# Patient Record
Sex: Female | Born: 1935 | Race: White | Hispanic: No | Marital: Single | Smoking: Never smoker
Health system: Southern US, Community
[De-identification: ages and names within clinical notes are randomized; demographics above are authoritative.]

## PROBLEM LIST (undated history)

## (undated) DIAGNOSIS — I499 Cardiac arrhythmia, unspecified: Secondary | ICD-10-CM

## (undated) DIAGNOSIS — N189 Chronic kidney disease, unspecified: Secondary | ICD-10-CM

## (undated) DIAGNOSIS — K219 Gastro-esophageal reflux disease without esophagitis: Secondary | ICD-10-CM

## (undated) DIAGNOSIS — I10 Essential (primary) hypertension: Secondary | ICD-10-CM

## (undated) DIAGNOSIS — H409 Unspecified glaucoma: Secondary | ICD-10-CM

## (undated) DIAGNOSIS — E7800 Pure hypercholesterolemia, unspecified: Secondary | ICD-10-CM

## (undated) HISTORY — PX: LAPAROSCOPIC OOPHORECTOMY: SUR783

## (undated) HISTORY — PX: CHOLECYSTECTOMY: SHX55

## (undated) HISTORY — PX: DILATION AND CURETTAGE OF UTERUS: SHX78

## (undated) HISTORY — PX: COLONOSCOPY: SHX174

## (undated) HISTORY — PX: ABDOMINAL HYSTERECTOMY: SHX81

## (undated) HISTORY — DX: Cardiac arrhythmia, unspecified: I49.9

## (undated) HISTORY — PX: BREAST SURGERY: SHX581

## (undated) HISTORY — PX: EYE SURGERY: SHX253

---

## 2014-05-22 DIAGNOSIS — N3941 Urge incontinence: Secondary | ICD-10-CM | POA: Insufficient documentation

## 2015-04-03 ENCOUNTER — Other Ambulatory Visit: Payer: Self-pay | Admitting: Surgery

## 2015-04-03 DIAGNOSIS — N6452 Nipple discharge: Secondary | ICD-10-CM

## 2015-04-18 ENCOUNTER — Other Ambulatory Visit: Payer: Self-pay | Admitting: *Deleted

## 2015-04-18 ENCOUNTER — Inpatient Hospital Stay
Admission: RE | Admit: 2015-04-18 | Discharge: 2015-04-18 | Disposition: A | Discharge status: Home / Self Care | Payer: Self-pay | Source: Ambulatory Visit | Attending: *Deleted | Admitting: *Deleted

## 2015-04-18 DIAGNOSIS — Z9289 Personal history of other medical treatment: Secondary | ICD-10-CM

## 2015-04-26 ENCOUNTER — Other Ambulatory Visit: Payer: Self-pay | Admitting: Surgery

## 2015-04-26 ENCOUNTER — Ambulatory Visit
Admission: RE | Admit: 2015-04-26 | Discharge: 2015-04-26 | Disposition: A | Discharge status: Home / Self Care | Payer: Medicare Other | Source: Ambulatory Visit | Attending: Surgery | Admitting: Surgery

## 2015-04-26 DIAGNOSIS — N6452 Nipple discharge: Secondary | ICD-10-CM

## 2015-05-01 ENCOUNTER — Other Ambulatory Visit: Payer: Self-pay | Admitting: Surgery

## 2015-05-01 DIAGNOSIS — N6310 Unspecified lump in the right breast, unspecified quadrant: Secondary | ICD-10-CM

## 2015-05-09 ENCOUNTER — Ambulatory Visit
Admission: RE | Admit: 2015-05-09 | Discharge: 2015-05-09 | Disposition: A | Discharge status: Home / Self Care | Payer: Medicare Other | Source: Ambulatory Visit | Attending: Surgery | Admitting: Surgery

## 2015-05-09 DIAGNOSIS — D241 Benign neoplasm of right breast: Secondary | ICD-10-CM | POA: Diagnosis not present

## 2015-05-09 DIAGNOSIS — R928 Other abnormal and inconclusive findings on diagnostic imaging of breast: Secondary | ICD-10-CM | POA: Diagnosis present

## 2015-05-09 DIAGNOSIS — N6452 Nipple discharge: Secondary | ICD-10-CM | POA: Diagnosis present

## 2015-05-09 DIAGNOSIS — N6310 Unspecified lump in the right breast, unspecified quadrant: Secondary | ICD-10-CM

## 2015-05-10 LAB — SURGICAL PATHOLOGY

## 2015-05-30 ENCOUNTER — Other Ambulatory Visit: Payer: Self-pay | Admitting: Surgery

## 2015-05-30 DIAGNOSIS — N63 Unspecified lump in unspecified breast: Secondary | ICD-10-CM

## 2015-06-04 ENCOUNTER — Encounter
Admission: RE | Admit: 2015-06-04 | Discharge: 2015-06-04 | Disposition: A | Discharge status: Home / Self Care | Payer: Medicare Other | Source: Ambulatory Visit | Attending: Surgery | Admitting: Surgery

## 2015-06-04 ENCOUNTER — Encounter: Payer: Self-pay | Admitting: *Deleted

## 2015-06-04 DIAGNOSIS — Z01810 Encounter for preprocedural cardiovascular examination: Secondary | ICD-10-CM | POA: Diagnosis not present

## 2015-06-04 HISTORY — DX: Essential (primary) hypertension: I10

## 2015-06-04 HISTORY — DX: Chronic kidney disease, unspecified: N18.9

## 2015-06-04 HISTORY — DX: Gastro-esophageal reflux disease without esophagitis: K21.9

## 2015-06-04 HISTORY — DX: Pure hypercholesterolemia, unspecified: E78.00

## 2015-06-04 NOTE — Pre-Procedure Instructions (Signed)
Medical clearance request called and faxed to Dr. Rochel Brome office, spoke to [place].

## 2015-06-04 NOTE — Pre-Procedure Instructions (Signed)
Dr. Rosey Bath notified of abnormal EKG, and medical clearance requested for surgery.

## 2015-06-04 NOTE — Patient Instructions (Signed)
  Your procedure is scheduled [id number] 21, 2017 (Tuesday) Report to Mammography.([place]) To find out your arrival time please call [phone] between 1PM - 3PM on ARRIVAL TIME 10:30 AM.  Remember: Instructions that are not followed completely may result in serious medical risk, up to and including death, or upon the discretion of your surgeon and anesthesiologist your surgery may need to be rescheduled.    __x__ 1. Do not eat food or drink liquids after midnight. No gum chewing or hard candies.     __x__ 2. No Alcohol for 24 hours before or after surgery.   ____ 3. Bring all medications with you on the day of surgery if instructed.    __x__ 4. Notify your doctor if there is any change in your medical condition     (cold, fever, infections).     Do not wear jewelry, make-up, hairpins, clips or nail polish.  Do not wear lotions, powders, or perfumes. You may wear deodorant.  Do not shave 48 hours prior to surgery. Men may shave face and neck.  Do not bring valuables to the hospital.    [facility] is not responsible for any belongings or valuables.               Contacts, dentures or bridgework may not be worn into surgery.  Leave your suitcase in the car. After surgery it may be brought to your room.  For patients admitted to the hospital, discharge time is determined by your                treatment team.   Patients discharged the day of surgery will not be allowed to drive home.   Please read over the following fact sheets that you were given:   Surgical Site Infection Prevention   __x__ Take these medicines the morning of surgery with A SIP OF WATER:    1. Losartan  2. Omeprazole (Omeprazole at bedtime on Monday night)  3.   4.  5.  6.  ____ Fleet Enema (as directed)   ____ Use CHG Soap as directed  ____ Use inhalers on the day of surgery  ____ Stop metformin 2 days prior to surgery    ____ Take 1/2 of usual insulin dose the night before surgery  and none on the morning of surgery.   _x___ Stop Coumadin/Plavix/aspirin on (Stop Aspirin today)  _x___ Stop Anti-inflammatories on (NO NSAIDS) Tylenol ok to take for pain if needed   __x__ Stop supplements until after surgery.  (Stop Probiotic, Centrum Women, I-CAPS, and Black cohosh) NOW  ____ Bring C-Pap to the hospital.

## 2015-06-10 NOTE — Pre-Procedure Instructions (Signed)
SPOKE WITH Ceola Para AT DR Nicholes Stairs OFFICE. MEDICAL MD HAS NOT BEEN ABLE TO CONTACT PATIENT TO CLEAR. WILL TRY TO GET WORKED IN TODAY

## 2015-06-11 ENCOUNTER — Encounter: Payer: Self-pay | Admitting: *Deleted

## 2015-06-11 ENCOUNTER — Ambulatory Visit
Admission: RE | Admit: 2015-06-11 | Discharge: 2015-06-11 | Disposition: A | Discharge status: Home / Self Care | Payer: Medicare Other | Source: Ambulatory Visit | Attending: Surgery | Admitting: Surgery

## 2015-06-11 ENCOUNTER — Ambulatory Visit: Payer: Medicare Other | Admitting: Anesthesiology

## 2015-06-11 ENCOUNTER — Encounter
Admission: RE | Disposition: A | Discharge status: Home / Self Care | Payer: Self-pay | Source: Ambulatory Visit | Attending: Surgery

## 2015-06-11 DIAGNOSIS — Z882 Allergy status to sulfonamides status: Secondary | ICD-10-CM | POA: Diagnosis not present

## 2015-06-11 DIAGNOSIS — E785 Hyperlipidemia, unspecified: Secondary | ICD-10-CM | POA: Insufficient documentation

## 2015-06-11 DIAGNOSIS — Z7982 Long term (current) use of aspirin: Secondary | ICD-10-CM | POA: Diagnosis not present

## 2015-06-11 DIAGNOSIS — I10 Essential (primary) hypertension: Secondary | ICD-10-CM | POA: Diagnosis not present

## 2015-06-11 DIAGNOSIS — Z79899 Other long term (current) drug therapy: Secondary | ICD-10-CM | POA: Insufficient documentation

## 2015-06-11 DIAGNOSIS — Z79890 Hormone replacement therapy (postmenopausal): Secondary | ICD-10-CM | POA: Diagnosis not present

## 2015-06-11 DIAGNOSIS — Z823 Family history of stroke: Secondary | ICD-10-CM | POA: Diagnosis not present

## 2015-06-11 DIAGNOSIS — N63 Unspecified lump in unspecified breast: Secondary | ICD-10-CM

## 2015-06-11 DIAGNOSIS — H409 Unspecified glaucoma: Secondary | ICD-10-CM | POA: Diagnosis not present

## 2015-06-11 DIAGNOSIS — Z90710 Acquired absence of both cervix and uterus: Secondary | ICD-10-CM | POA: Insufficient documentation

## 2015-06-11 DIAGNOSIS — D241 Benign neoplasm of right breast: Secondary | ICD-10-CM | POA: Diagnosis not present

## 2015-06-11 DIAGNOSIS — Z820 Family history of epilepsy and other diseases of the nervous system: Secondary | ICD-10-CM | POA: Diagnosis not present

## 2015-06-11 DIAGNOSIS — Z9049 Acquired absence of other specified parts of digestive tract: Secondary | ICD-10-CM | POA: Insufficient documentation

## 2015-06-11 DIAGNOSIS — N62 Hypertrophy of breast: Secondary | ICD-10-CM | POA: Diagnosis not present

## 2015-06-11 DIAGNOSIS — Z90722 Acquired absence of ovaries, bilateral: Secondary | ICD-10-CM | POA: Diagnosis not present

## 2015-06-11 DIAGNOSIS — Z803 Family history of malignant neoplasm of breast: Secondary | ICD-10-CM | POA: Insufficient documentation

## 2015-06-11 HISTORY — PX: BREAST BIOPSY: SHX20

## 2015-06-11 HISTORY — DX: Unspecified glaucoma: H40.9

## 2015-06-11 HISTORY — PX: BREAST EXCISIONAL BIOPSY: SUR124

## 2015-06-11 SURGERY — BREAST BIOPSY WITH NEEDLE LOCALIZATION
Anesthesia: General | Site: Breast | Laterality: Right | Wound class: Clean

## 2015-06-11 MED ORDER — HYDROCODONE-ACETAMINOPHEN 5-325 MG PO TABS: 1.0000 | ORAL_TABLET | ORAL | Status: DC | PRN

## 2015-06-11 MED ORDER — LACTATED RINGERS IV SOLN
INTRAVENOUS | Status: DC
Start: 2015-06-11 — End: 2015-06-11
  Administered 2015-06-11: 12:00:00 via INTRAVENOUS

## 2015-06-11 MED ORDER — PROPOFOL 10 MG/ML IV BOLUS
INTRAVENOUS | Status: DC | PRN
  Administered 2015-06-11: 100 mg via INTRAVENOUS

## 2015-06-11 MED ORDER — ACETAMINOPHEN 10 MG/ML IV SOLN
INTRAVENOUS | Status: DC | PRN
  Administered 2015-06-11: 1000 mg via INTRAVENOUS

## 2015-06-11 MED ORDER — FENTANYL CITRATE (PF) 100 MCG/2ML IJ SOLN
25.0000 ug | INTRAMUSCULAR | Status: DC | PRN
Start: 2015-06-11 — End: 2015-06-11

## 2015-06-11 MED ORDER — ACETAMINOPHEN 10 MG/ML IV SOLN
INTRAVENOUS | Status: AC
  Filled 2015-06-11: qty 100

## 2015-06-11 MED ORDER — ONDANSETRON HCL 4 MG/2ML IJ SOLN: 4.0000 mg | Freq: Once | INTRAMUSCULAR | Status: DC | PRN

## 2015-06-11 MED ORDER — LIDOCAINE HCL (CARDIAC) 20 MG/ML IV SOLN
INTRAVENOUS | Status: DC | PRN
  Administered 2015-06-11: 60 mg via INTRAVENOUS

## 2015-06-11 MED ORDER — ONDANSETRON HCL 4 MG/2ML IJ SOLN
INTRAMUSCULAR | Status: DC | PRN
  Administered 2015-06-11: 4 mg via INTRAVENOUS

## 2015-06-11 MED ORDER — FENTANYL CITRATE (PF) 100 MCG/2ML IJ SOLN
INTRAMUSCULAR | Status: DC | PRN
  Administered 2015-06-11 (×4): 25 ug via INTRAVENOUS

## 2015-06-11 MED ORDER — EPHEDRINE SULFATE 50 MG/ML IJ SOLN
INTRAMUSCULAR | Status: DC | PRN
  Administered 2015-06-11 (×2): 10 mg via INTRAVENOUS

## 2015-06-11 MED ORDER — BUPIVACAINE-EPINEPHRINE (PF) 0.5% -1:200000 IJ SOLN
INTRAMUSCULAR | Status: AC
  Filled 2015-06-11: qty 30

## 2015-06-11 MED ORDER — BUPIVACAINE-EPINEPHRINE (PF) 0.5% -1:200000 IJ SOLN
INTRAMUSCULAR | Status: DC | PRN
  Administered 2015-06-11: 7 mL via PERINEURAL

## 2015-06-11 SURGICAL SUPPLY — 23 items
CANISTER SUCT 1200ML W/VALVE (MISCELLANEOUS) ×3 IMPLANT
CHLORAPREP W/TINT 26ML (MISCELLANEOUS) ×3 IMPLANT
CNTNR SPEC 2.5X3XGRAD LEK (MISCELLANEOUS)
CONT SPEC 4OZ STER OR WHT (MISCELLANEOUS)
CONTAINER SPEC 2.5X3XGRAD LEK (MISCELLANEOUS) IMPLANT
DRAPE LAPAROTOMY 77X122 PED (DRAPES) ×3 IMPLANT
ELECT REM PT RETURN 9FT ADLT (ELECTROSURGICAL) ×3
ELECTRODE REM PT RTRN 9FT ADLT (ELECTROSURGICAL) ×1 IMPLANT
GAUZE SPONGE 4X4 12PLY STRL (GAUZE/BANDAGES/DRESSINGS) ×3 IMPLANT
GLOVE BIO SURGEON STRL SZ7.5 (GLOVE) ×9 IMPLANT
GLOVE BIOGEL PI IND STRL 7.5 (GLOVE) ×1 IMPLANT
GLOVE BIOGEL PI INDICATOR 7.5 (GLOVE) ×2
GLOVE EXAM NITRILE PF MED BLUE (GLOVE) ×3 IMPLANT
GOWN STRL REUS W/ TWL LRG LVL3 (GOWN DISPOSABLE) ×3 IMPLANT
GOWN STRL REUS W/TWL LRG LVL3 (GOWN DISPOSABLE) ×6
KIT RM TURNOVER STRD PROC AR (KITS) ×3 IMPLANT
LABEL OR SOLS (LABEL) ×3 IMPLANT
LIQUID BAND (GAUZE/BANDAGES/DRESSINGS) ×3 IMPLANT
NEEDLE HYPO 25X1 1.5 SAFETY (NEEDLE) ×6 IMPLANT
PACK BASIN MINOR ARMC (MISCELLANEOUS) ×3 IMPLANT
SUT CHROMIC 5 0 RB 1 27 (SUTURE) ×3 IMPLANT
SYRINGE 10CC LL (SYRINGE) ×3 IMPLANT
WATER STERILE IRR 1000ML POUR (IV SOLUTION) ×3 IMPLANT

## 2015-06-11 NOTE — Anesthesia Preprocedure Evaluation (Addendum)
Anesthesia Evaluation  Patient identified by MRN, date of birth, ID band Patient awake    Reviewed: Allergy & Precautions, H&P , NPO status , Patient's Chart, lab work & pertinent test results, reviewed documented beta blocker date and time   Airway Mallampati: II  TM Distance: >3 FB Neck ROM: full    Dental  (+) Teeth Intact   Pulmonary neg pulmonary ROS,    Pulmonary exam normal        Cardiovascular Exercise Tolerance: Good hypertension, negative cardio ROS Normal cardiovascular exam Rate:Normal     Neuro/Psych negative neurological ROS  negative psych ROS   GI/Hepatic negative GI ROS, Neg liver ROS, GERD  ,  Endo/Other  negative endocrine ROS  Renal/GU Renal diseasenegative Renal ROS  negative genitourinary   Musculoskeletal   Abdominal   Peds  Hematology negative hematology ROS (+)   Anesthesia Other Findings   Reproductive/Obstetrics negative OB ROS                            Anesthesia Physical Anesthesia Plan  ASA: II  Anesthesia Plan: General LMA   Post-op Pain Management:    Induction:   Airway Management Planned: LMA  Additional Equipment:   Intra-op Plan:   Post-operative Plan:   Informed Consent: I have reviewed the patients History and Physical, chart, labs and discussed the procedure including the risks, benefits and alternatives for the proposed anesthesia with the patient or authorized representative who has indicated his/her understanding and acceptance.     Plan Discussed with: CRNA  Anesthesia Plan Comments:        Anesthesia Quick Evaluation

## 2015-06-11 NOTE — Transfer of Care (Signed)
Immediate Anesthesia Transfer of Care Note  Patient: Felicia Hughes  Procedure(s) Performed: Procedure(s): BREAST BIOPSY WITH NEEDLE LOCALIZATION (Right)  Patient Location: PACU  Anesthesia Type:General  Level of Consciousness: sedated and responds to stimulation  Airway & Oxygen Therapy: Patient Spontanous Breathing and Patient connected to face mask oxygen  Post-op Assessment: Report given to RN and Post -op Vital signs reviewed and stable  Post vital signs: Reviewed and stable  Last Vitals:  Filed Vitals:   06/11/15 1153 06/11/15 1550  BP: 141/57 115/49  Pulse: 69 86  Temp: 36.6 C 36.6 C  Resp: 14 17    Complications: No apparent anesthesia complications

## 2015-06-11 NOTE — Discharge Instructions (Signed)
Take Tylenol or Norco if needed for pain.  May resume aspirin on Thursday.  May wear bra as desired for support.May shower    .AMBULATORY SURGERY  DISCHARGE INSTRUCTIONS   1) The drugs that you were given will stay in your system until tomorrow so for the next 24 hours you should not:  A) Drive an automobile B) Make any legal decisions C) Drink any alcoholic beverage   2) You may resume regular meals tomorrow.  Today it is better to start with liquids and gradually work up to solid foods.  You may eat anything you prefer, but it is better to start with liquids, then soup and crackers, and gradually work up to solid foods.   3) Please notify your doctor immediately if you have any unusual bleeding, trouble breathing, redness and pain at the surgery site, drainage, fever, or pain not relieved by medication. 4)   5) Your post-operative visit with Dr.                                     is: Date:                        Time:    Please call to schedule your post-operative visit.  6) Additional Instructions:

## 2015-06-11 NOTE — Pre-Procedure Instructions (Signed)
Interpreted EKG by Dr. Rockey Situ showed poor R wave progression not anterior MI, spoke with Dr. Ronelle Nigh 06/10/15, 17:00, in light of this interpretation no additional clearance needed and it would be okay to proceed to surgery.  Dr. Thompson Caul office had not been able to reach the patient and she did not call Same Day for arrival time.  After speaking with Dr. Tamala Julian this morning it is decided that if she arrives to needle localization this am as had been scheduled we will proceed with surgery.

## 2015-06-11 NOTE — Anesthesia Postprocedure Evaluation (Signed)
Anesthesia Post Note  Patient: Felicia Hughes  Procedure(s) Performed: Procedure(s) (LRB): BREAST BIOPSY WITH NEEDLE LOCALIZATION (Right)  Patient location during evaluation: PACU Anesthesia Type: General Level of consciousness: awake and alert Pain management: pain level controlled Vital Signs Assessment: post-procedure vital signs reviewed and stable Respiratory status: spontaneous breathing, nonlabored ventilation, respiratory function stable and patient connected to nasal cannula oxygen Cardiovascular status: blood pressure returned to baseline and stable Postop Assessment: no signs of nausea or vomiting Anesthetic complications: no    Last Vitals:  Filed Vitals:   06/11/15 1631 06/11/15 1708  BP: 126/45 117/44  Pulse: 85 85  Temp:    Resp: 16 16    Last Pain:  Filed Vitals:   06/11/15 1709  PainSc: 2                  Martha Clan

## 2015-06-11 NOTE — Progress Notes (Signed)
Dr. Tamala Julian in to see pt and advises ok to d/c to home

## 2015-06-11 NOTE — Anesthesia Procedure Notes (Signed)
Procedure Name: LMA Insertion Date/Time: 06/11/2015 2:24 PM Performed by: Delaney Meigs Pre-anesthesia Checklist: Patient identified, Emergency Drugs available, Suction available, Patient being monitored and Timeout performed Patient Re-evaluated:Patient Re-evaluated prior to inductionOxygen Delivery Method: Circle system utilized and Simple face mask Preoxygenation: Pre-oxygenation with 100% oxygen Intubation Type: IV induction Ventilation: Mask ventilation without difficulty LMA Size: 3.5 Tube type: Oral Number of attempts: 1 Placement Confirmation: breath sounds checked- equal and bilateral and positive ETCO2 Tube secured with: Tape

## 2015-06-11 NOTE — H&P (Signed)
  She reports no change in condition since the day of the office visit. She has had the Kopan's wire inserted. I have reviewed her mammogram images. Recent biopsy demonstrated intraductal papilloma. Plan is to remove the right breast mass as discussed. Right side was marked YES

## 2015-06-11 NOTE — Op Note (Signed)
OPERATIVE REPORT  PREOPERATIVE  DIAGNOSIS: . Intraductal papilloma right breast  POSTOPERATIVE DIAGNOSIS: . Intraductal papilloma right breast  PROCEDURE: . Excision of right breast mass  ANESTHESIA:  General  SURGEON: Rochel Brome  MD   INDICATIONS: . She has a history of right nipple discharge. She had ultrasound-guided biopsy of a mass in the retroareolar portion of the right breast associated with a dilated duct. Biopsy demonstrated intraductal papilloma. She had preoperative x-ray needle localization. Surgery was recommended for definitive treatment and further evaluation.  With the patient on the operating table in the supine position under general anesthesia the dressing was removed from the right breast exposing the Kopan's wire which entered the upper aspect of the right breast. The wire was cut 2 cm from the skin. Mammograms reviewed. The site was prepared with ChloraPrep and draped in a sterile manner  An incision was made at the border of the areola from 10:00 to 2:00 position carried down through subcutaneous tissues to encounter the thick portion of the wire. A sample of tissue surrounding the wire was excised using electrocautery for hemostasis. The specimen was submitted for specimen mammogram and routine pathology. The radiologist did call to report that the biopsy marker was seen within the specimen mammogram.  The wound was inspected and a number of small bleeding points were cauterized until hemostasis was intact. Subcutaneous tissues were infiltrated with half percent Sensorcaine with epinephrine. Subcutaneous tissues tissues were approximated with 4-0 Monocryl. The skin was closed with running 4-0 Monocryl subcuticular suture and LiquiBand. The patient appeared to be in satisfactory condition and was prepared for transfer to the recovery room  [email or web address].

## 2015-06-12 ENCOUNTER — Encounter: Payer: Self-pay | Admitting: Surgery

## 2015-06-12 LAB — SURGICAL PATHOLOGY

## 2015-06-18 DIAGNOSIS — N390 Urinary tract infection, site not specified: Secondary | ICD-10-CM | POA: Insufficient documentation

## 2016-01-03 ENCOUNTER — Other Ambulatory Visit: Payer: Self-pay | Admitting: *Deleted

## 2016-01-03 ENCOUNTER — Inpatient Hospital Stay
Admission: RE | Admit: 2016-01-03 | Discharge: 2016-01-03 | Disposition: A | Discharge status: Home / Self Care | Payer: Self-pay | Source: Ambulatory Visit | Attending: *Deleted | Admitting: *Deleted

## 2016-01-03 DIAGNOSIS — Z9289 Personal history of other medical treatment: Secondary | ICD-10-CM

## 2016-02-26 ENCOUNTER — Other Ambulatory Visit: Payer: Self-pay | Admitting: Internal Medicine

## 2016-02-26 DIAGNOSIS — Z1231 Encounter for screening mammogram for malignant neoplasm of breast: Secondary | ICD-10-CM

## 2016-03-31 DIAGNOSIS — N3281 Overactive bladder: Secondary | ICD-10-CM | POA: Insufficient documentation

## 2016-04-29 ENCOUNTER — Ambulatory Visit
Admission: RE | Admit: 2016-04-29 | Discharge: 2016-04-29 | Disposition: A | Discharge status: Home / Self Care | Payer: Medicare Other | Source: Ambulatory Visit | Attending: Internal Medicine | Admitting: Internal Medicine

## 2016-04-29 DIAGNOSIS — Z1231 Encounter for screening mammogram for malignant neoplasm of breast: Secondary | ICD-10-CM | POA: Diagnosis present

## 2016-05-14 ENCOUNTER — Other Ambulatory Visit: Payer: Self-pay | Admitting: Internal Medicine

## 2016-05-14 DIAGNOSIS — R634 Abnormal weight loss: Secondary | ICD-10-CM

## 2016-05-14 DIAGNOSIS — D649 Anemia, unspecified: Secondary | ICD-10-CM

## 2016-05-25 ENCOUNTER — Ambulatory Visit
Admission: RE | Admit: 2016-05-25 | Discharge: 2016-05-25 | Disposition: A | Discharge status: Home / Self Care | Payer: Medicare Other | Source: Ambulatory Visit | Attending: Internal Medicine | Admitting: Internal Medicine

## 2016-05-25 DIAGNOSIS — R634 Abnormal weight loss: Secondary | ICD-10-CM

## 2016-05-25 DIAGNOSIS — D649 Anemia, unspecified: Secondary | ICD-10-CM | POA: Diagnosis present

## 2016-05-25 DIAGNOSIS — K4090 Unilateral inguinal hernia, without obstruction or gangrene, not specified as recurrent: Secondary | ICD-10-CM | POA: Insufficient documentation

## 2016-05-25 DIAGNOSIS — N2889 Other specified disorders of kidney and ureter: Secondary | ICD-10-CM | POA: Insufficient documentation

## 2016-05-25 MED ORDER — IOPAMIDOL (ISOVUE-300) INJECTION 61%
85.0000 mL | Freq: Once | INTRAVENOUS | Status: AC | PRN
  Administered 2016-05-25: 85 mL via INTRAVENOUS

## 2016-12-11 NOTE — Progress Notes (Signed)
12/14/2016 11:08 AM   Felicia Hughes Aug 07, 1933 [id number]  Referring provider: Tracie Harrier, MD [address] [facility] [place],  [address]  Chief Complaint  Patient presents with  . New Patient (Initial Visit)    recurrent UTI    HPI: Patient is a 83 -year-old Caucasian female who is referred to [place] by, Dr. Ginette Pitman, for recurrent urinary tract infections.  Patient states that she has had several urinary tract infections over the last year.  Reviewing her records,  she has had 6 documented positive urine cultures.  She is a former patient of Dr. Bernardo Heater and was last seen by him in 03/2016.  She was managed on methenamine and oxybutynin.    Her symptoms with a urinary tract infection consist of frequency, urgency and suprapubic pain.  She is currently on Ceftin at this time.  Last urine culture performed on 12/10/2016 was negative.    Today, she is experiencing frequency x 1 1/2 hour, urgency is strong, nocturia x 4, incontinence (wearing one thin pad daily, SUI and UI), intermittency, hesitancy and straining to urinate.  She endorses suprapubic pain, but she denies dysuria, gross hematuria, back pain, abdominal pain and flank pain.  She has not had any recent fevers, chills, nausea or vomiting.  Her UA was negative.  This was a CATH specimen.  Her PVR was 35 mL.    She does not have a history of nephrolithiasis, GU surgery or GU trauma.  She has not been sexually active for over one year.    She is postmenopausal.   She admits to constipation.  She has a rectocele.   She is taking a stool softener daily.  She is having daily BM's.  She does engage in good perineal hygiene. She does not take tub baths.   She is having pain with bladder filling.    Contrast CT performed on 05/25/2016 for unintentional weight loss noted bilateral renal scarring.  She is drinking 3 to 4 cups of water daily.   1 diet Coca-Cola daily.  1 cup of coffee daily.  1 glass of  wine daily.    Reviewed referral notes - patient is on Hiprex (quit taking the medication - didn't feel it was helping), vaginal estrogen cream (not consistently), oxybutynin (still tanking) and tropsium (not taking)    PMH: Past Medical History:  Diagnosis Date  . Arrhythmia   . Chronic kidney disease    UTI  . GERD (gastroesophageal reflux disease)   . Glaucoma (increased eye pressure)   . Hypercholesterolemia   . Hypertension     Surgical History: Past Surgical History:  Procedure Laterality Date  . ABDOMINAL HYSTERECTOMY    . BREAST BIOPSY Right 06/11/2015   Procedure: BREAST BIOPSY WITH NEEDLE LOCALIZATION;  Surgeon: Leonie Green, MD;  Location: [facility] ORS;  Service: General;  Laterality: Right;  . BREAST EXCISIONAL BIOPSY Right 06/11/2015   NEG  . BREAST SURGERY Right    Breast Needle Biopsy  . CHOLECYSTECTOMY    . DILATION AND CURETTAGE OF UTERUS    . EYE SURGERY Bilateral    Cataract Extraction with IOL  . LAPAROSCOPIC OOPHORECTOMY      Home Medications:  Allergies as of 12/14/2016      Reactions   Nitrofurantoin Hives   Sulfa Antibiotics Rash      Medication List       Accurate as of 12/14/16 11:08 AM. Always use your most recent med list.  aspirin EC 81 MG tablet Take 81 mg by mouth daily.   atorvastatin 20 MG tablet Commonly known as:  LIPITOR Take 20 mg by mouth daily at 6 PM.   cefUROXime 250 MG tablet Commonly known as:  CEFTIN Take 250 mg by mouth 2 (two) times daily with a meal.   CENTRUM SILVER ULTRA WOMENS Tabs Take 1 tablet by mouth daily.   ICAPS AREDS FORMULA PO Take 1 capsule by mouth 2 (two) times daily.   cetirizine 10 MG tablet Commonly known as:  ZYRTEC Take 10 mg by mouth daily.   cholecalciferol 1000 units tablet Commonly known as:  VITAMIN D Take 1,000 Units by mouth daily.   docusate sodium 100 MG capsule Commonly known as:  COLACE Take 100 mg by mouth at bedtime.   estradiol 0.1 MG/GM vaginal  cream Commonly known as:  ESTRACE VAGINAL Apply 0.35m (pea-sized amount)  just inside the vaginal introitus with a finger-tip every night for two weeks and then Monday, Wednesday and Friday nights.   HYDROcodone-acetaminophen 5-325 MG tablet Commonly known as:  NORCO Take 1-2 tablets by mouth every 4 (four) hours as needed for moderate pain.   losartan 50 MG tablet Commonly known as:  COZAAR Take 25 mg by mouth daily.   omeprazole 20 MG capsule Commonly known as:  PRILOSEC Take 20 mg by mouth daily.   oxybutynin 5 MG tablet Commonly known as:  DITROPAN Take 5 mg by mouth 3 (three) times daily as needed for bladder spasms.   PROBIOTIC FORMULA PO Take 1 capsule by mouth daily.   zolpidem 10 MG tablet Commonly known as:  AMBIEN Take 10 mg by mouth at bedtime as needed for sleep.            Discharge Care Instructions        Start     Ordered   12/14/16 0000  CULTURE, URINE COMPREHENSIVE     12/14/16 1030   12/14/16 0000  Urinalysis, Complete     12/14/16 1030   12/14/16 0000  estradiol (ESTRACE VAGINAL) 0.1 MG/GM vaginal cream    Question:  Supervising Provider  Answer:  BHollice Espy  12/14/16 1107      Allergies:  Allergies  Allergen Reactions  . Nitrofurantoin Hives  . Sulfa Antibiotics Rash    Family History: Family History  Problem Relation Age of Onset  . Breast cancer Mother 31      early 339's . Kidney cancer Neg Hx   . Kidney disease Neg Hx   . Prostate cancer Neg Hx     Social History:  reports that she has never smoked. She has never used smokeless tobacco. She reports that she drinks about 0.6 oz of alcohol per week . She reports that she does not use drugs.  ROS: UROLOGY Frequent Urination?: Yes Hard to postpone urination?: Yes Burning/pain with urination?: Yes Get up at night to urinate?: Yes Leakage of urine?: Yes Urine stream starts and stops?: Yes Trouble starting stream?: Yes Do you have to strain to urinate?: Yes Blood in  urine?: No Urinary tract infection?: No Sexually transmitted disease?: No Injury to kidneys or bladder?: No Painful intercourse?: No Weak stream?: No Currently pregnant?: No Vaginal bleeding?: No Last menstrual period?: n  Gastrointestinal Nausea?: No Vomiting?: No Indigestion/heartburn?: No Diarrhea?: No Constipation?: Yes  Constitutional Fever: No Night sweats?: No Weight loss?: Yes Fatigue?: Yes  Skin Skin rash/lesions?: No Itching?: Yes  Eyes Blurred vision?: No Double vision?: No  Ears/Nose/Throat Sore throat?: No Sinus problems?: Yes  Hematologic/Lymphatic Swollen glands?: No Easy bruising?: Yes  Cardiovascular Leg swelling?: No Chest pain?: No  Respiratory Cough?: No Shortness of breath?: No  Endocrine Excessive thirst?: No  Musculoskeletal Back pain?: No Joint pain?: No  Neurological Headaches?: No Dizziness?: No  Psychologic Depression?: No Anxiety?: No  Physical Exam: BP (!) 158/77   Pulse 76   Ht 5' (1.524 m)   Wt 103 lb 3.2 oz (46.8 kg)   BMI 20.15 kg/m   Constitutional: Well nourished. Alert and oriented, No acute distress. HEENT: [place] AT, moist mucus membranes. Trachea midline, no masses. Cardiovascular: No clubbing, cyanosis, or edema. Respiratory: Normal respiratory effort, no increased work of breathing. GI: Abdomen is soft, non tender, non distended, no abdominal masses. Liver and spleen not palpable.  No hernias appreciated.  Stool sample for occult testing is not indicated.   GU: No CVA tenderness.  No bladder fullness or masses.  Atrophic external genitalia, normal pubic hair distribution, no lesions.  Normal urethral meatus, no lesions, no prolapse, no discharge.   No urethral masses, tenderness and/or tenderness. No bladder fullness, tenderness or masses. Pale vagina mucosa, poor estrogen effect, no discharge, no lesions, good pelvic support, Grade I cystocele is noted.  Rectocele noted.  Cervix and uterus are surgically  absent.  No adnexal/parametria masses or tenderness noted.  Anus and perineum are without rashes or lesions.    Skin: No rashes, bruises or suspicious lesions. Lymph: No cervical or inguinal adenopathy. Neurologic: Grossly intact, no focal deficits, moving all 4 extremities. Psychiatric: Normal mood and affect.  Laboratory Data: Urinalysis Negative.  See EPIC.    I have reviewed the labs.  Pertinent Imaging: CLINICAL DATA:  Unintentional weight loss.  EXAM: CT ABDOMEN AND PELVIS WITH CONTRAST  TECHNIQUE: Multidetector CT imaging of the abdomen and pelvis was performed using the standard protocol following bolus administration of intravenous contrast.  CONTRAST:  70m ISOVUE-300 IOPAMIDOL (ISOVUE-300) INJECTION 61%  COMPARISON:  None.  FINDINGS: Lower chest: No acute abnormality.  Hepatobiliary: No focal liver abnormality is seen. Status post cholecystectomy. No biliary dilatation.  Pancreas: Unremarkable. No pancreatic ductal dilatation or surrounding inflammatory changes.  Spleen: Normal in size without focal abnormality.  Adrenals/Urinary Tract: Adrenal glands appear normal. Bilateral renal cortical scarring is noted. No hydronephrosis or renal obstruction is noted. Urinary bladder is unremarkable.  Stomach/Bowel: There is no evidence of bowel obstruction.  Vascular/Lymphatic: No significant vascular findings are present. No enlarged abdominal or pelvic lymph nodes.  Reproductive: Status post hysterectomy. No adnexal masses.  Other: Mild fat containing right inguinal hernia is noted. No abnormal fluid collection is noted.  Musculoskeletal: No acute or significant osseous findings.  IMPRESSION: Bilateral renal cortical scarring.  Mild fat containing right inguinal hernia.  No other abnormality seen in the abdomen or pelvis.   Electronically Signed   By: JMarijo Conception M.D.   On: 05/25/2016 10:41 I have independently reviewed the  films.    Assessment & Plan:    1. Recurrent UTI's  - criteria for recurrent UTI has been met with 2 or more infections in 6 months or 3 or greater infections in one year   - Patient is instructed to increase their water intake until the urine is pale yellow or clear (10 to 12 cups daily)   - probiotics (yogurt, oral pills or vaginal suppositories), take cranberry pills or drink the juice and Vitamin C 1,000 mg daily to acidify the urine should be added to  their daily regimen   - advised them to have CATH UA's for urinalysis and culture to prevent skin contamination of the specimen  - reviewed symptoms of UTI and advised not to have urine checked or be treated for UTI if not experiencing symptoms  - discussed antibiotic stewardship with the patient    2. Vaginal atrophy  - I explained to the patient that when women go through menopause and her estrogen levels are severely diminished, the normal vaginal flora will change.  This is due to an increase of the vaginal canal's pH. Because of this, the vaginal canal may be colonized by bacteria from the rectum instead of the protective lactobacillus.  This accompanied by the loss of the mucus barrier with vaginal atrophy is a cause of recurrent urinary tract infections.  - In some studies, the use of vaginal estrogen cream has been demonstrated to reduce  recurrent urinary tract infections to one a year.   - She will follow up in three months for an exam.    - Continue vaginal estrogen cream Monday, Wednesday and Friday evenings  3. Suprapubic pain  - patient encouraged to increase her water intake to 1.5 L daily  - avoid all sodas - discontinue Ceftin as urine culture was negative and today's UA is clear  - will reassess in 2 weeks                                              Return in about 2 weeks (around 12/28/2016) for PVR and OAB questionnaire.  These notes generated with voice recognition software. I apologize for typographical  errors.  Zara Council, [facility] [address] [place], [place] [address] [phone]

## 2016-12-14 ENCOUNTER — Ambulatory Visit (INDEPENDENT_AMBULATORY_CARE_PROVIDER_SITE_OTHER): Payer: Medicare Other | Admitting: Urology

## 2016-12-14 ENCOUNTER — Encounter: Payer: Self-pay | Admitting: Urology

## 2016-12-14 VITALS — BP 158/77 | HR 76 | Ht 60.0 in | Wt 103.2 lb

## 2016-12-14 DIAGNOSIS — R102 Pelvic and perineal pain: Secondary | ICD-10-CM | POA: Diagnosis not present

## 2016-12-14 DIAGNOSIS — N390 Urinary tract infection, site not specified: Secondary | ICD-10-CM | POA: Diagnosis not present

## 2016-12-14 DIAGNOSIS — N952 Postmenopausal atrophic vaginitis: Secondary | ICD-10-CM | POA: Diagnosis not present

## 2016-12-14 LAB — URINALYSIS, COMPLETE
Bilirubin, UA: NEGATIVE
GLUCOSE, UA: NEGATIVE
KETONES UA: NEGATIVE
Leukocytes, UA: NEGATIVE
Nitrite, UA: NEGATIVE
Protein, UA: NEGATIVE
RBC, UA: NEGATIVE
SPEC GRAV UA: 1.01 (ref 1.005–1.030)
UUROB: 0.2 mg/dL (ref 0.2–1.0)
pH, UA: 7 (ref 5.0–7.5)

## 2016-12-14 MED ORDER — ESTRADIOL 0.1 MG/GM VA CREA: TOPICAL_CREAM | VAGINAL | 12 refills | Status: DC

## 2016-12-14 NOTE — Progress Notes (Signed)
In and Out Catheterization  Patient is present today for a I & O catheterization due to recurrent UTI. Patient was cleaned and prepped in a sterile fashion with betadine and Lidocaine 2% jelly was instilled into the urethra.  A 14FR cath was inserted no complications were noted , 67ml of urine return was noted, urine was dark yellow in color. A clean urine sample was collected for u/a and cx. Bladder was drained  And catheter was removed with out difficulty.    Preformed by: Toniann Fail, LPN

## 2016-12-14 NOTE — Patient Instructions (Signed)

## 2016-12-16 IMAGING — MG MM DIGITAL DIAGNOSTIC UNILAT*R*
2 series · 2 of 2 positions shown · non-contrast
Comparison: Previous exam(s).

CLINICAL DATA: 82-year-old female status post ultrasound-guided
right breast biopsy

EXAM:
DIAGNOSTIC RIGHT MAMMOGRAM POST ULTRASOUND BIOPSY

[R ML]
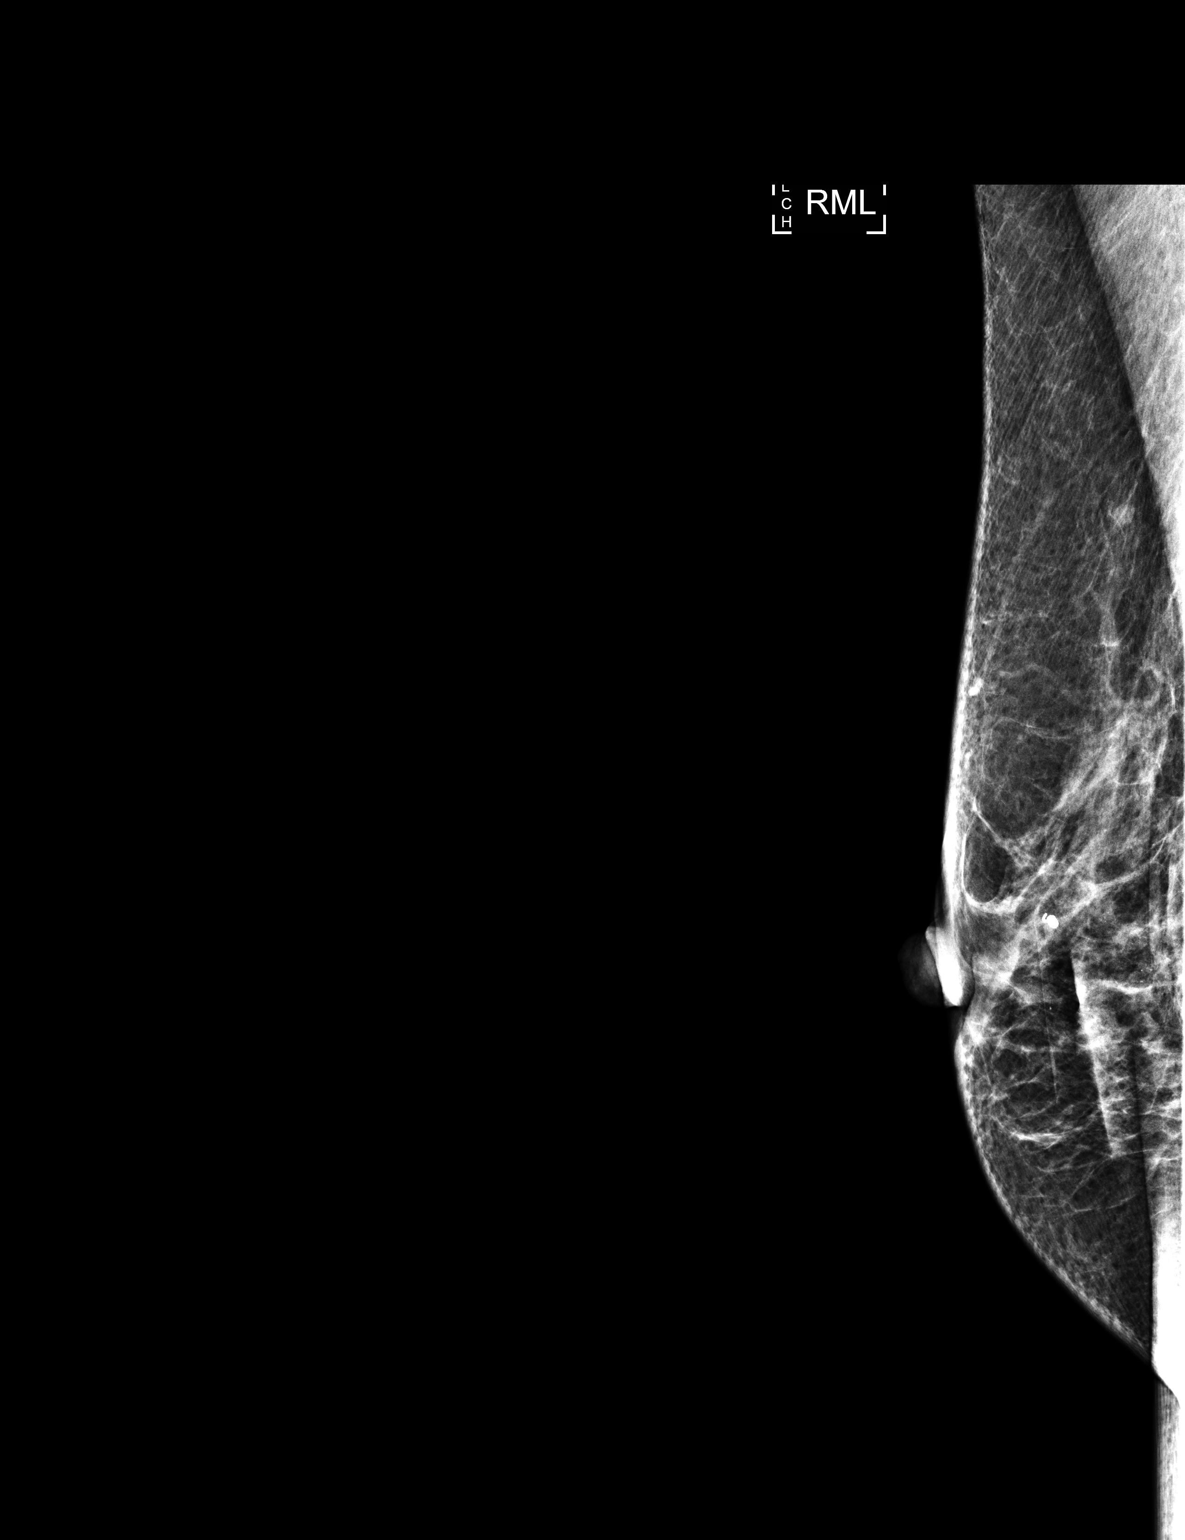

[R CC]
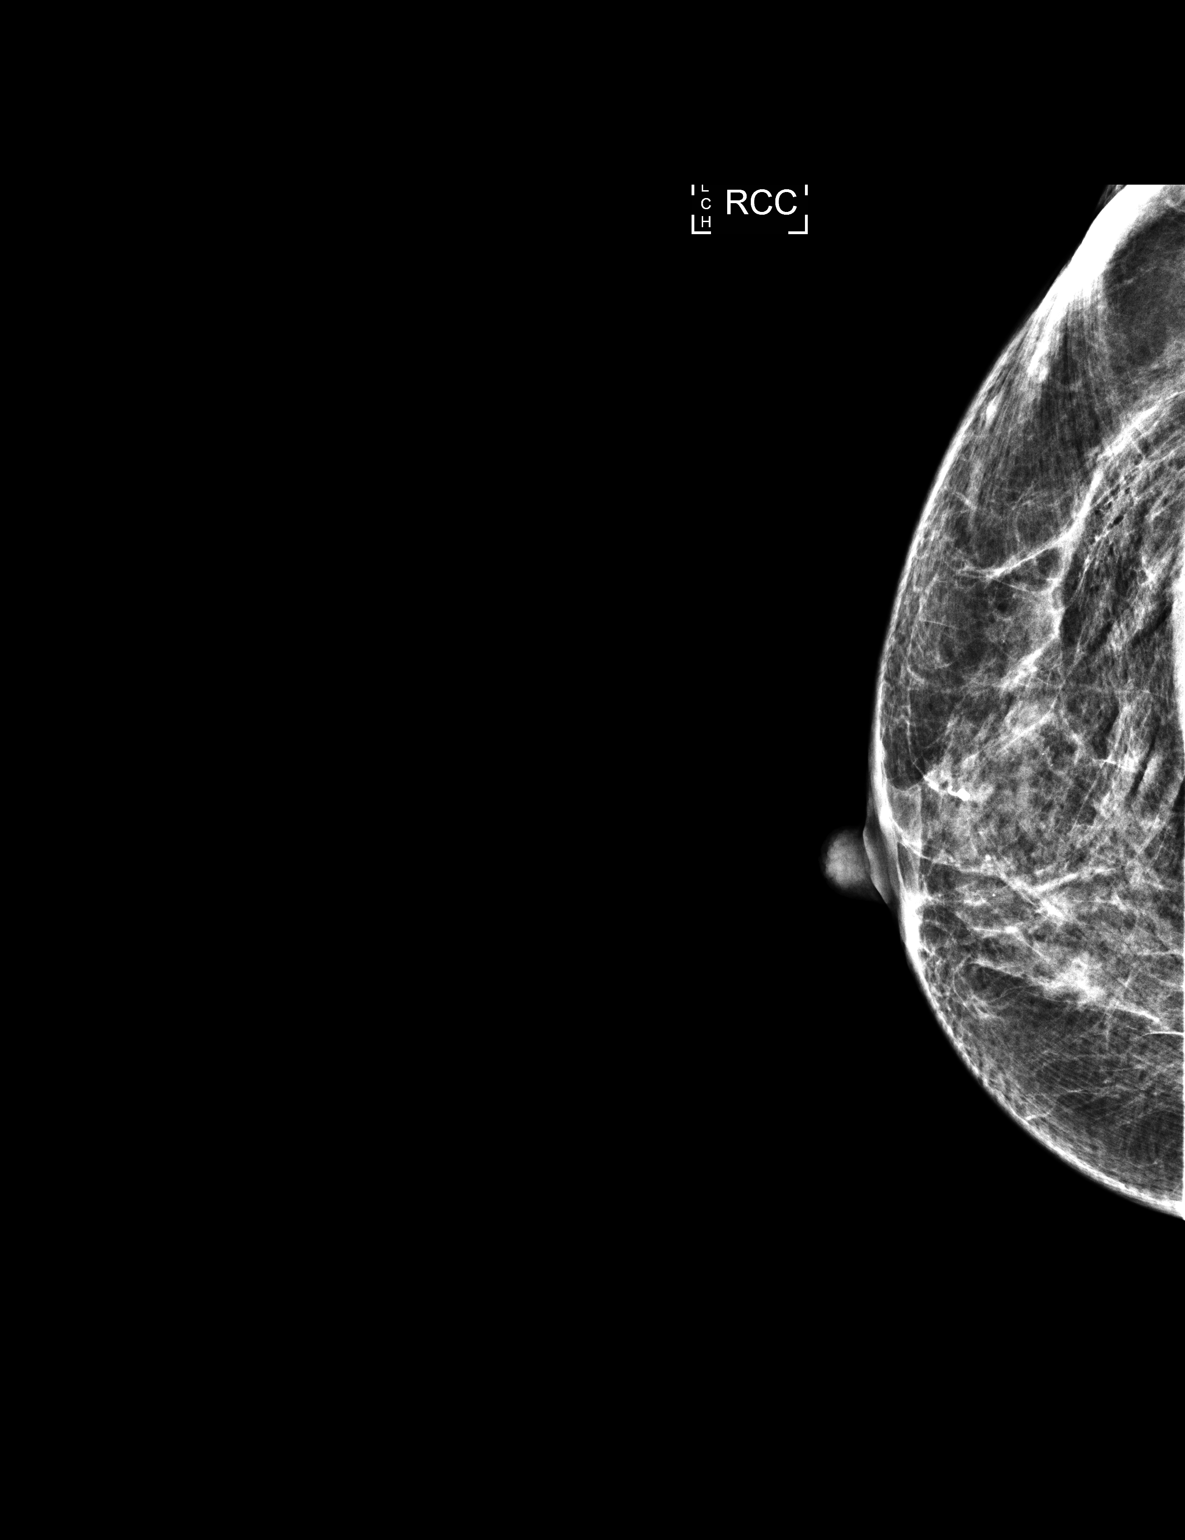

[2 of 2 positions shown; findings below may reference images not displayed]

FINDINGS: Mammographic images were obtained following ultrasound guided biopsy
of an intraductal mass versus debris. Post biopsy mammogram
demonstrates the coil shaped biopsy marker in the expected position
within the subareolar right breast.
IMPRESSION: Satisfactory marker placement post ultrasound-guided right breast
biopsy.

Final Assessment: Post Procedure Mammograms for Marker Placement

## 2016-12-17 LAB — CULTURE, URINE COMPREHENSIVE

## 2016-12-18 ENCOUNTER — Telehealth: Payer: Self-pay | Admitting: Family Medicine

## 2016-12-18 NOTE — Telephone Encounter (Signed)
Patient notified and voiced understanding.

## 2016-12-18 NOTE — Telephone Encounter (Signed)
-----   Message from Nori Riis, PA-C sent at 12/17/2016  9:15 PM EDT ----- Please let Mrs. Quiett know that her urine culture was negative.

## 2016-12-25 NOTE — Progress Notes (Signed)
12/28/2016 2:30 PM   Felicia Hughes 02-Feb-1934 [id number]  Referring provider: Tracie Harrier, MD [address] [facility] [place], [place] [address]  Chief Complaint  Patient presents with  . Follow-up    2 weeks Suprapubic pain    HPI: 83 yo WF with recurrent UTI's, vaginal atrophy and suprapubic pain who presents today for a 2 week follow up after being instructed to increase her water intake to 1.5 L daily.  Background history Patient is a 89 -year-old Caucasian female who is referred to [place] by, Dr. Ginette Pitman, for recurrent urinary tract infections.  Patient states that she has had several urinary tract infections over the last year.  Reviewing her records,  she has had 6 documented positive urine cultures.  She is a former patient of Dr. Bernardo Heater and was last seen by him in 03/2016.  She was managed on methenamine and oxybutynin.  Her symptoms with a urinary tract infection consist of frequency, urgency and suprapubic pain.  She is currently on Ceftin at this time.  Last urine culture performed on 12/10/2016 was negative.  Today, she is experiencing frequency x 1 1/2 hour, urgency is strong, nocturia x 4, incontinence (wearing one thin pad daily, SUI and UI), intermittency, hesitancy and straining to urinate.  She endorses suprapubic pain, but she denies dysuria, gross hematuria, back pain, abdominal pain and flank pain.  She has not had any recent fevers, chills, nausea or vomiting.  Her UA was negative.  This was a CATH specimen.  Her PVR was 35 mL.  She does not have a history of nephrolithiasis, GU surgery or GU trauma.  She has not been sexually active for over one year.  She is postmenopausal.   She admits to constipation.  She has a rectocele.   She is taking a stool softener daily.  She is having daily BM's.  She does engage in good perineal hygiene. She does not take tub baths.   She is having pain with bladder filling.   Contrast CT performed on 05/25/2016 for  unintentional weight loss noted bilateral renal scarring.  She is drinking 3 to 4 cups of water daily.   1 diet Coca-Cola daily.  1 cup of coffee daily.  1 glass of wine daily.   Reviewed referral notes - patient is on Hiprex (quit taking the medication - didn't feel it was helping), vaginal estrogen cream (not consistently), oxybutynin (still taking) and tropsium (not taking)    At her visit, 2 weeks ago, she was encouraged to increase her water intake and to abstain from caffeinated beverages.  She was also encouraged to use the vaginal estrogen cream consistently.    Today, she has been experiencing urgency x 4-7, frequency x 8 or more, not restricting fluids to avoid visits to the restroom, is engaging in toilet mapping, incontinence x 4-7 and nocturia x 4-7.  Her PVR is 130 mL.   She is not having dysuria or gross hematuria.  She is not having fevers, chills, nausea or vomiting.   CATH UA is positive for 11-30 WBC's.   Nothing helps the urinary symptoms. Nothing makes the urinary symptoms worse.  PMH: Past Medical History:  Diagnosis Date  . Arrhythmia   . Chronic kidney disease    UTI  . GERD (gastroesophageal reflux disease)   . Glaucoma (increased eye pressure)   . Hypercholesterolemia   . Hypertension     Surgical History: Past Surgical History:  Procedure Laterality Date  . ABDOMINAL  HYSTERECTOMY    . BREAST BIOPSY Right 06/11/2015   Procedure: BREAST BIOPSY WITH NEEDLE LOCALIZATION;  Surgeon: Leonie Green, MD;  Location: [facility] ORS;  Service: General;  Laterality: Right;  . BREAST EXCISIONAL BIOPSY Right 06/11/2015   NEG  . BREAST SURGERY Right    Breast Needle Biopsy  . CHOLECYSTECTOMY    . DILATION AND CURETTAGE OF UTERUS    . EYE SURGERY Bilateral    Cataract Extraction with IOL  . LAPAROSCOPIC OOPHORECTOMY      Home Medications:  Allergies as of 12/28/2016      Reactions   Nitrofurantoin Hives   Sulfa Antibiotics Rash      Medication List       Accurate  as of 12/28/16  2:30 PM. Always use your most recent med list.          aspirin EC 81 MG tablet Take 81 mg by mouth daily.   atorvastatin 20 MG tablet Commonly known as:  LIPITOR Take 20 mg by mouth daily at 6 PM.   cefUROXime 250 MG tablet Commonly known as:  CEFTIN Take 250 mg by mouth 2 (two) times daily with a meal.   CENTRUM SILVER ULTRA WOMENS Tabs Take 1 tablet by mouth daily.   ICAPS AREDS FORMULA PO Take 1 capsule by mouth 2 (two) times daily.   cetirizine 10 MG tablet Commonly known as:  ZYRTEC Take 10 mg by mouth daily.   cholecalciferol 1000 units tablet Commonly known as:  VITAMIN D Take 1,000 Units by mouth daily.   docusate sodium 100 MG capsule Commonly known as:  COLACE Take 100 mg by mouth at bedtime.   estradiol 0.1 MG/GM vaginal cream Commonly known as:  ESTRACE VAGINAL Apply 0.98m (pea-sized amount)  just inside the vaginal introitus with a finger-tip every night for two weeks and then Monday, Wednesday and Friday nights.   HYDROcodone-acetaminophen 5-325 MG tablet Commonly known as:  NORCO Take 1-2 tablets by mouth every 4 (four) hours as needed for moderate pain.   losartan 50 MG tablet Commonly known as:  COZAAR Take 25 mg by mouth daily.   mirabegron ER 25 MG Tb24 tablet Commonly known as:  MYRBETRIQ Take 1 tablet (25 mg total) by mouth daily.   omeprazole 20 MG capsule Commonly known as:  PRILOSEC Take 20 mg by mouth daily.   oxybutynin 5 MG tablet Commonly known as:  DITROPAN Take 5 mg by mouth 3 (three) times daily as needed for bladder spasms.   PROBIOTIC FORMULA PO Take 1 capsule by mouth daily.   zolpidem 10 MG tablet Commonly known as:  AMBIEN Take 10 mg by mouth at bedtime as needed for sleep.       Allergies:  Allergies  Allergen Reactions  . Nitrofurantoin Hives  . Sulfa Antibiotics Rash    Family History: Family History  Problem Relation Age of Onset  . Breast cancer Mother 368      early 388's . Kidney  cancer Neg Hx   . Kidney disease Neg Hx   . Prostate cancer Neg Hx   . Bladder Cancer Neg Hx     Social History:  reports that she has never smoked. She has never used smokeless tobacco. She reports that she drinks about 0.6 oz of alcohol per week . She reports that she does not use drugs.  ROS: UROLOGY Frequent Urination?: Yes Hard to postpone urination?: Yes Burning/pain with urination?: No Get up at night to urinate?: Yes Leakage  of urine?: Yes Urine stream starts and stops?: Yes Trouble starting stream?: Yes Do you have to strain to urinate?: Yes Blood in urine?: No Urinary tract infection?: Yes Sexually transmitted disease?: No Injury to kidneys or bladder?: No Painful intercourse?: No Weak stream?: Yes Currently pregnant?: No Vaginal bleeding?: No Last menstrual period?: n  Gastrointestinal Nausea?: No Vomiting?: No Indigestion/heartburn?: No Diarrhea?: No Constipation?: Yes  Constitutional Fever: No Night sweats?: No Weight loss?: No Fatigue?: No  Skin Skin rash/lesions?: No Itching?: No  Eyes Blurred vision?: No Double vision?: No  Ears/Nose/Throat Sore throat?: No Sinus problems?: Yes  Hematologic/Lymphatic Swollen glands?: No Easy bruising?: Yes  Cardiovascular Leg swelling?: No Chest pain?: No  Respiratory Cough?: No Shortness of breath?: No  Endocrine Excessive thirst?: No  Musculoskeletal Back pain?: No Joint pain?: No  Neurological Headaches?: No Dizziness?: No  Psychologic Depression?: No Anxiety?: No  Physical Exam: BP 125/69   Pulse 94   Ht 5' (1.524 m)   Wt 104 lb (47.2 kg)   BMI 20.31 kg/m   Constitutional: Well nourished. Alert and oriented, No acute distress. HEENT: [place] AT, moist mucus membranes. Trachea midline, no masses. Cardiovascular: No clubbing, cyanosis, or edema. Respiratory: Normal respiratory effort, no increased work of breathing. Skin: No rashes, bruises or suspicious lesions. Lymph: No  cervical or inguinal adenopathy. Neurologic: Grossly intact, no focal deficits, moving all 4 extremities. Psychiatric: Normal mood and affect.  Laboratory Data: Urinalysis CATH UA  Positive for 11-30 WBC's.   See EPIC.    I have reviewed the labs.  Pertinent Imaging: Results for Doeden, Felicia (MRN [id number]) as of 12/28/2016 13:34  Ref. Range 12/28/2016 13:20  Scan Result Unknown 130   I have independently reviewed the films.    Assessment & Plan:    1. Recurrent UTI's  - criteria for recurrent UTI has been met with 2 or more infections in 6 months or 3 or greater infections in one year   - encouraged the patient to increase their water intake until the urine is pale yellow or clear (10 to 12 cups daily)   - probiotics (yogurt, oral pills or vaginal suppositories), take cranberry pills or drink the juice and Vitamin C 1,000 mg daily to acidify the urine should be added to their daily regimen   - advised them to have CATH UA's for urinalysis and culture to prevent skin contamination of the specimen  - reviewed symptoms of UTI and advised not to have urine checked or be treated for UTI if not experiencing symptoms  - discussed antibiotic stewardship with the patient    - CATH UA suspicious for infection, will send for culture  2. Vaginal atrophy  - She will follow up in three months for an exam.    - Continue vaginal estrogen cream Monday, Wednesday and Friday evenings  3. Frequency  - Failed anticholinergics  - given Myrbetriq 25 mg, # 28 I have advised the patient of the side effects of Myrbetriq, such as: elevation in BP, urinary retention and/or HA.  - RTC in 3 weeks for OAB questionnaire and PVR.                                              Return in about 3 weeks (around 01/18/2017) for PVR and OAB questionnaire.  These notes generated with voice recognition software. I apologize for typographical errors.  Zara Council, [facility] [address], [place], [place] [address] [phone]

## 2016-12-28 ENCOUNTER — Encounter: Payer: Self-pay | Admitting: Urology

## 2016-12-28 ENCOUNTER — Ambulatory Visit: Payer: Medicare Other | Admitting: Urology

## 2016-12-28 VITALS — BP 125/69 | HR 94 | Ht 60.0 in | Wt 104.0 lb

## 2016-12-28 DIAGNOSIS — N390 Urinary tract infection, site not specified: Secondary | ICD-10-CM

## 2016-12-28 DIAGNOSIS — R350 Frequency of micturition: Secondary | ICD-10-CM

## 2016-12-28 DIAGNOSIS — N952 Postmenopausal atrophic vaginitis: Secondary | ICD-10-CM

## 2016-12-28 DIAGNOSIS — R102 Pelvic and perineal pain: Secondary | ICD-10-CM | POA: Diagnosis not present

## 2016-12-28 LAB — MICROSCOPIC EXAMINATION
Bacteria, UA: NONE SEEN
EPITHELIAL CELLS (NON RENAL): NONE SEEN /HPF (ref 0–10)
RBC MICROSCOPIC, UA: NONE SEEN /HPF (ref 0–?)

## 2016-12-28 LAB — URINALYSIS, COMPLETE
Bilirubin, UA: NEGATIVE
GLUCOSE, UA: NEGATIVE
KETONES UA: NEGATIVE
NITRITE UA: NEGATIVE
Protein, UA: NEGATIVE
RBC, UA: NEGATIVE
SPEC GRAV UA: 1.01 (ref 1.005–1.030)
UUROB: 0.2 mg/dL (ref 0.2–1.0)
pH, UA: 7 (ref 5.0–7.5)

## 2016-12-28 LAB — BLADDER SCAN AMB NON-IMAGING: SCAN RESULT: 130

## 2016-12-28 MED ORDER — MIRABEGRON ER 25 MG PO TB24: 25.0000 mg | ORAL_TABLET | Freq: Every day | ORAL | 0 refills | Status: DC

## 2016-12-28 NOTE — Progress Notes (Signed)
In and Out Catheterization  Patient is present today for a I & O catheterization due to thinks she has an uti. Patient was cleaned and prepped in a sterile fashion with betadine and Lidocaine 2% jelly was instilled into the urethra.  A 16FR cath was inserted no complications were noted , 190ml of urine return was noted, urine was yellow in color. A clean urine sample was collected for urinalysis. Bladder was drained and catheter was removed with out difficulty.    Preformed by: Lyndee Hensen CMA

## 2017-01-03 ENCOUNTER — Other Ambulatory Visit: Payer: Self-pay | Admitting: Urology

## 2017-01-03 LAB — CULTURE, URINE COMPREHENSIVE

## 2017-01-03 MED ORDER — CIPROFLOXACIN HCL 250 MG PO TABS: 250.0000 mg | ORAL_TABLET | Freq: Two times a day (BID) | ORAL | 0 refills | Status: DC

## 2017-01-03 NOTE — Progress Notes (Unsigned)
See result note.  

## 2017-01-04 ENCOUNTER — Telehealth: Payer: Self-pay

## 2017-01-04 NOTE — Telephone Encounter (Signed)
-----   Message from Nori Riis, PA-C sent at 01/03/2017  7:57 PM EDT ----- Please let Mrs. Harshberger know that her urine culture was positive.  I have sent a prescription for Cipro 250 mg, one tablet twice daily to [facility] on [address].

## 2017-01-04 NOTE — Telephone Encounter (Signed)
Spoke with patient and advised her of the message and she will pick the medication up today. And she will keep her f/up app on the 29th.  Sharyn Lull

## 2017-01-04 NOTE — Telephone Encounter (Signed)
LMOM- +ucx and abx sent to pharmacy

## 2017-01-06 ENCOUNTER — Telehealth: Payer: Self-pay | Admitting: Urology

## 2017-01-06 ENCOUNTER — Ambulatory Visit (INDEPENDENT_AMBULATORY_CARE_PROVIDER_SITE_OTHER): Payer: Medicare Other

## 2017-01-06 VITALS — BP 138/73 | HR 83 | Ht 60.0 in | Wt 103.6 lb

## 2017-01-06 DIAGNOSIS — N390 Urinary tract infection, site not specified: Secondary | ICD-10-CM

## 2017-01-06 LAB — BLADDER SCAN AMB NON-IMAGING: Scan Result: 317

## 2017-01-06 NOTE — Telephone Encounter (Signed)
Pt called and left message wanting you to give her a call.  She was in this morning and has some questions for you concerning her bag.  [phone]

## 2017-01-06 NOTE — Progress Notes (Signed)
Pt presented today c/o lower abd pain and feeling as though the cipro was not working. PVR was 317. Per Larene Beach pt could have a foley placed or stop myrbetriq and come back next week for another PVR. Pt inquired about having her urethra dilated as she felt it helped several years ago. Per Larene Beach a dilation can not happen with an active infection, therefore pt would have to RTC in a couple of weeks. Made pt aware of Shannons orders. Pt then elected to have a foley placed and follow up with Larene Beach on 01/18/17.   Simple Catheter Placement  Due to urinary retention patient is present today for a foley cath placement.  Patient was cleaned and prepped in a sterile fashion with betadine and lidocaine jelly 2% was instilled into the urethra.  A 16 FR foley catheter was inserted, urine return was noted  466ml, urine was clear yellow in color.  The balloon was filled with 10cc of sterile water.  A leg bag was attached for drainage. Patient was also given a night bag to take home and was given instruction on how to change from one bag to another.  Patient was given instruction on proper catheter care.  Patient tolerated well, no complications were noted   Preformed by: Toniann Fail, LPN   Additional notes/ Follow up: 2 weeks  Blood pressure 138/73, pulse 83, height 5' (1.524 m), weight 103 lb 9.6 oz (47 kg).

## 2017-01-08 NOTE — Telephone Encounter (Signed)
Left message on machine for patient to call back in re guards to her bag.

## 2017-01-13 NOTE — Telephone Encounter (Signed)
LMOM for patient to call office back in regards to questions about her bag.

## 2017-01-14 NOTE — Telephone Encounter (Signed)
Tried to call patient and the number below that was given someone picked up and stated this is not patient number it was a [place]. No other number listed for patient. Called Patient daughter for help [phone] she gave me a knew number. [phone] Home number for patient. LMOM for patient to return our call so we can answer her questions.

## 2017-01-14 NOTE — Progress Notes (Signed)
01/18/2017 2:13 PM   Felicia Hughes Apr 27, 1933 [id number]  Referring provider: Tracie Harrier, [place], [place] [address]  No chief complaint on file.   HPI: 83 yo WF with recurrent UTI's, vaginal atrophy, suprapubic pain and urinary frequency who presents today for a 3 week follow up after being tried on Myrbetriq 25 mg daily.    Background history Patient is a 82 -year-old Caucasian female who is referred to [place] by, Dr. Ginette Pitman, for recurrent urinary tract infections.  Patient states that she has had several urinary tract infections over the last year.  Reviewing her records,  she has had 6 documented positive urine cultures.  She is a former patient of Dr. Bernardo Heater and was last seen by him in 03/2016.  She was managed on methenamine and oxybutynin.  Her symptoms with a urinary tract infection consist of frequency, urgency and suprapubic pain.  She is currently on Ceftin at this time.  Last urine culture performed on 12/10/2016 was negative.  Today, she is experiencing frequency x 1 1/2 hour, urgency is strong, nocturia x 4, incontinence (wearing one thin pad daily, SUI and UI), intermittency, hesitancy and straining to urinate.  She endorses suprapubic pain, but she denies dysuria, gross hematuria, back pain, abdominal pain and flank pain.  She has not had any recent fevers, chills, nausea or vomiting.  Her UA was negative.  This was a CATH specimen.  Her PVR was 35 mL.  She does not have a history of nephrolithiasis, GU surgery or GU trauma.  She has not been sexually active for over one year.  She is postmenopausal.   She admits to constipation.  She has a rectocele.   She is taking a stool softener daily.  She is having daily BM's.  She does engage in good perineal hygiene. She does not take tub baths.   She is having pain with bladder filling.   Contrast CT performed on 05/25/2016 for unintentional weight loss noted bilateral renal scarring.  She is  drinking 3 to 4 cups of water daily.   1 diet Coca-Cola daily.  1 cup of coffee daily.  1 glass of wine daily.   Reviewed referral notes - patient is on Hiprex (quit taking the medication - didn't feel it was helping), vaginal estrogen cream (not consistently), oxybutynin (still taking) and tropsium (not taking)    At her visit, 2 weeks ago, she was encouraged to increase her water intake and to abstain from caffeinated beverages.  She was also encouraged to use the vaginal estrogen cream consistently.    At her visit on 12/28/2016, she was been experiencing urgency x 4-7, frequency x 8 or more, not restricting fluids to avoid visits to the restroom, is engaging in toilet mapping, incontinence x 4-7 and nocturia x 4-7.  Her PVR was 130 mL.   She is not having dysuria or gross hematuria.  She is not having fevers, chills, nausea or vomiting.   CATH UA is positive for 11-30 WBC's.   Nothing helps the urinary symptoms. Nothing makes the urinary symptoms worse.  She was given a trial of Myrbetriq, but she went into urinary retention and had to have a Foley placed.    Today, she has removed her own Foley that night.  She experiencing frequency, urgency, nocturia, incontinence, intermittency, hesitancy, straining to urinate and a weak urinary stream.  She is having urgency x 0-3, frequency x 8 or more, not restricting fluids to  avoid visits to the restroom, is engaging in toilet mapping, incontinence x 8 or more and nocturia x 4-7.    She is still experiencing suprapubic discomfort and dysuria.  Her CATH UA today is positive for 11-30 WBCs, many bacteria and nitrite positive.Marland Kitchen   Her PVR was 280 mL.    PMH: Past Medical History:  Diagnosis Date  . Arrhythmia   . Chronic kidney disease    UTI  . GERD (gastroesophageal reflux disease)   . Glaucoma (increased eye pressure)   . Hypercholesterolemia   . Hypertension     Surgical History: Past Surgical History:  Procedure Laterality Date  . ABDOMINAL  HYSTERECTOMY    . BREAST BIOPSY Right 06/11/2015   Procedure: BREAST BIOPSY WITH NEEDLE LOCALIZATION;  Surgeon: Leonie Green, MD;  Location: [facility] ORS;  Service: General;  Laterality: Right;  . BREAST EXCISIONAL BIOPSY Right 06/11/2015   NEG  . BREAST SURGERY Right    Breast Needle Biopsy  . CHOLECYSTECTOMY    . DILATION AND CURETTAGE OF UTERUS    . EYE SURGERY Bilateral    Cataract Extraction with IOL  . LAPAROSCOPIC OOPHORECTOMY      Home Medications:  Allergies as of 01/18/2017      Reactions   Nitrofurantoin Hives   Sulfa Antibiotics Rash      Medication List       Accurate as of 01/18/17  2:13 PM. Always use your most recent med list.          amoxicillin 875 MG tablet Commonly known as:  AMOXIL Take 1 tablet (875 mg total) by mouth every 12 (twelve) hours.   aspirin EC 81 MG tablet Take 81 mg by mouth daily.   atorvastatin 20 MG tablet Commonly known as:  LIPITOR Take 20 mg by mouth daily at 6 PM.   cefUROXime 250 MG tablet Commonly known as:  CEFTIN Take 250 mg by mouth 2 (two) times daily with a meal.   CENTRUM SILVER ULTRA WOMENS Tabs Take 1 tablet by mouth daily.   ICAPS AREDS FORMULA PO Take 1 capsule by mouth 2 (two) times daily.   cetirizine 10 MG tablet Commonly known as:  ZYRTEC Take 10 mg by mouth daily.   cholecalciferol 1000 units tablet Commonly known as:  VITAMIN D Take 1,000 Units by mouth daily.   docusate sodium 100 MG capsule Commonly known as:  COLACE Take 100 mg by mouth at bedtime.   estradiol 0.1 MG/GM vaginal cream Commonly known as:  ESTRACE VAGINAL Apply 0.37m (pea-sized amount)  just inside the vaginal introitus with a finger-tip every night for two weeks and then Monday, Wednesday and Friday nights.   HYDROcodone-acetaminophen 5-325 MG tablet Commonly known as:  NORCO Take 1-2 tablets by mouth every 4 (four) hours as needed for moderate pain.   losartan 50 MG tablet Commonly known as:  COZAAR Take 50 mg by  mouth daily.   mirabegron ER 25 MG Tb24 tablet Commonly known as:  MYRBETRIQ Take 1 tablet (25 mg total) by mouth daily.   omeprazole 20 MG capsule Commonly known as:  PRILOSEC Take 20 mg by mouth daily.   oxybutynin 5 MG tablet Commonly known as:  DITROPAN Take 5 mg by mouth 3 (three) times daily as needed for bladder spasms.   PROBIOTIC FORMULA PO Take 1 capsule by mouth daily.   zolpidem 10 MG tablet Commonly known as:  AMBIEN Take 10 mg by mouth at bedtime as needed for sleep.  Allergies:  Allergies  Allergen Reactions  . Nitrofurantoin Hives  . Sulfa Antibiotics Rash    Family History: Family History  Problem Relation Age of Onset  . Breast cancer Mother 57       early 76's  . Kidney cancer Neg Hx   . Kidney disease Neg Hx   . Prostate cancer Neg Hx   . Bladder Cancer Neg Hx     Social History:  reports that she has never smoked. She has never used smokeless tobacco. She reports that she drinks about 0.6 oz of alcohol per week . She reports that she does not use drugs.  ROS: UROLOGY Frequent Urination?: Yes Hard to postpone urination?: Yes Burning/pain with urination?: No Get up at night to urinate?: Yes Leakage of urine?: Yes Urine stream starts and stops?: Yes Trouble starting stream?: Yes Do you have to strain to urinate?: Yes Blood in urine?: No Urinary tract infection?: Yes Sexually transmitted disease?: No Injury to kidneys or bladder?: No Painful intercourse?: No Weak stream?: Yes Currently pregnant?: No Vaginal bleeding?: No Last menstrual period?: n  Gastrointestinal Nausea?: Yes Vomiting?: No Indigestion/heartburn?: No Diarrhea?: No Constipation?: No  Constitutional Fever: No Night sweats?: No Weight loss?: No Fatigue?: Yes  Skin Skin rash/lesions?: No Itching?: No  Eyes Blurred vision?: No Double vision?: No  Ears/Nose/Throat Sore throat?: No Sinus problems?: No  Hematologic/Lymphatic Swollen glands?:  No Easy bruising?: Yes  Cardiovascular Leg swelling?: No Chest pain?: No  Respiratory Cough?: No Shortness of breath?: No  Endocrine Excessive thirst?: No  Musculoskeletal Back pain?: No Joint pain?: No  Neurological Headaches?: No Dizziness?: No  Psychologic Depression?: No Anxiety?: No  Physical Exam: BP (!) 146/75 (BP Location: Right Arm, Patient Position: Sitting, Cuff Size: Normal)   Pulse 97   Ht 5' (1.524 m)   Wt 100 lb 14.4 oz (45.8 kg)   BMI 19.71 kg/m   Constitutional: Well nourished. Alert and oriented, No acute distress. HEENT: [place] AT, moist mucus membranes. Trachea midline, no masses. Cardiovascular: No clubbing, cyanosis, or edema. Respiratory: Normal respiratory effort, no increased work of breathing. Skin: No rashes, bruises or suspicious lesions. Lymph: No cervical or inguinal adenopathy. Neurologic: Grossly intact, no focal deficits, moving all 4 extremities. Psychiatric: Normal mood and affect.  Laboratory Data: Urinalysis CATH UA  Positive for 11-30 WBC's.   Many bacteria.  See EPIC.    I have reviewed the labs.   Assessment & Plan:    1. Recurrent UTI's  - criteria for recurrent UTI has been met with 2 or more infections in 6 months or 3 or greater infections in one year   - encouraged the patient to increase their water intake until the urine is pale yellow or clear (10 to 12 cups daily) - she states she is drinking 3 to 4 - 500 mL bottles of water daily   - probiotics (yogurt, oral pills or vaginal suppositories), take cranberry pills or drink the juice and Vitamin C 1,000 mg daily to acidify the urine should be added to their daily regimen - she has not been taking these supplements  - will restart the Hiprex - one gram bid  - CATH UA suspicious for infection, will send for culture - start amoxicillin empirically - will adjust once culture is available  2. Vaginal atrophy  - She will follow up in three months for an exam.    -  Continue vaginal estrogen cream Monday, Wednesday and Friday evenings  3. Incomplete bladder emptying  -  she states that the only thing that helps her bladder is dilation  - will schedule urethral dilation once UTI has cleared                                   Return in about 2 weeks (around 02/01/2017) for urethral dilation - 30 minutes appointment.  These notes generated with voice recognition software. I apologize for typographical errors.  Zara Council, [facility] [address], [place], [place] [address] [phone]

## 2017-01-15 NOTE — Telephone Encounter (Signed)
Daughter called back on voicemail wanting a call back. Also a voicemail was taken by Kallie Locks that stated patient LM there was a conference appointment on the 29th made to talk about everything and no need to call back. On message I left for daughter regarding her message I let her know if that was what her call was in reference to she didn't need to return my call but if she needed [place] to call back.

## 2017-01-18 ENCOUNTER — Encounter: Payer: Self-pay | Admitting: Urology

## 2017-01-18 ENCOUNTER — Ambulatory Visit (INDEPENDENT_AMBULATORY_CARE_PROVIDER_SITE_OTHER): Payer: Medicare Other | Admitting: Urology

## 2017-01-18 VITALS — BP 146/75 | HR 97 | Ht 60.0 in | Wt 100.9 lb

## 2017-01-18 DIAGNOSIS — R339 Retention of urine, unspecified: Secondary | ICD-10-CM

## 2017-01-18 DIAGNOSIS — N390 Urinary tract infection, site not specified: Secondary | ICD-10-CM

## 2017-01-18 DIAGNOSIS — N952 Postmenopausal atrophic vaginitis: Secondary | ICD-10-CM

## 2017-01-18 LAB — MICROSCOPIC EXAMINATION
Epithelial Cells (non renal): NONE SEEN /hpf (ref 0–10)
RBC MICROSCOPIC, UA: NONE SEEN /HPF (ref 0–?)

## 2017-01-18 LAB — URINALYSIS, COMPLETE
Bilirubin, UA: NEGATIVE
GLUCOSE, UA: NEGATIVE
KETONES UA: NEGATIVE
NITRITE UA: POSITIVE — AB
Protein, UA: NEGATIVE
SPEC GRAV UA: 1.01 (ref 1.005–1.030)
UUROB: 0.2 mg/dL (ref 0.2–1.0)
pH, UA: 6 (ref 5.0–7.5)

## 2017-01-18 IMAGING — MG MM RT PLC BREAST LOC DEV   1ST LESION  INC MAMMO GUIDE
3 series · 3 of 3 positions shown · non-contrast
Comparison: Previous exams.

CLINICAL DATA: Recently diagnosed right breast intraductal
papilloma.

EXAM:
NEEDLE LOCALIZATION OF THE RIGHT BREAST WITH MAMMO GUIDANCE

[R ML (1 of 2)]
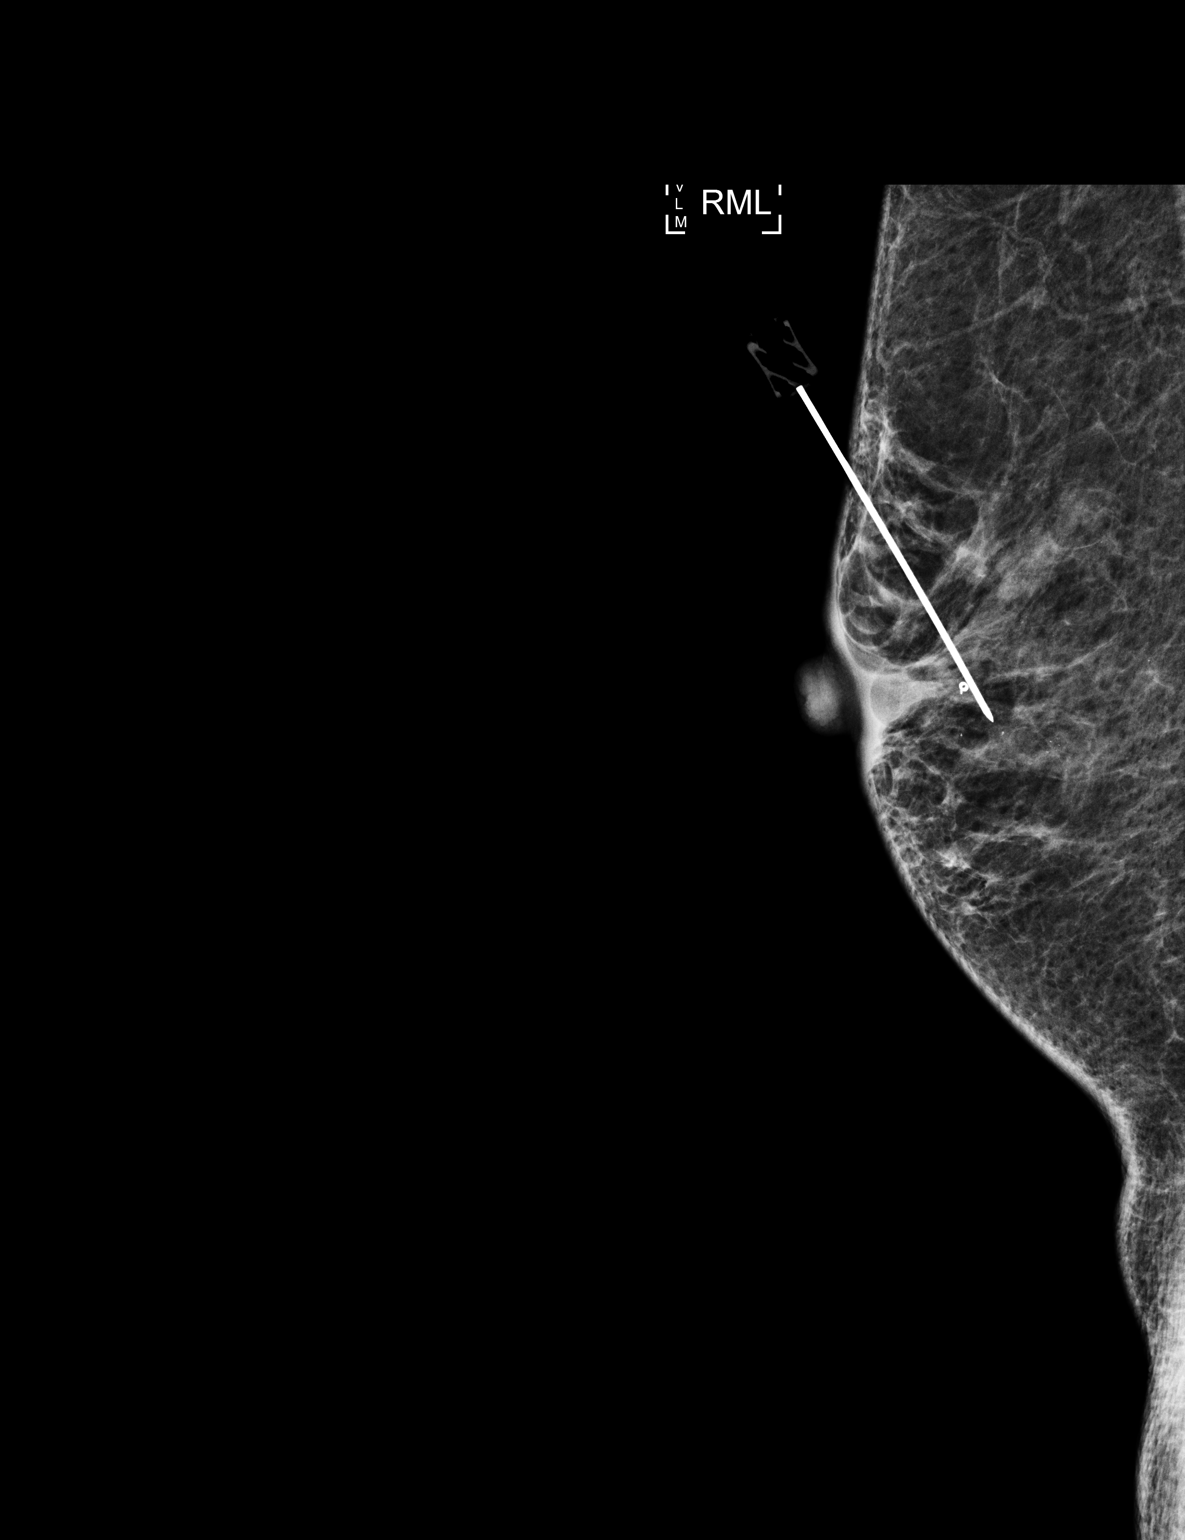

[R CC]
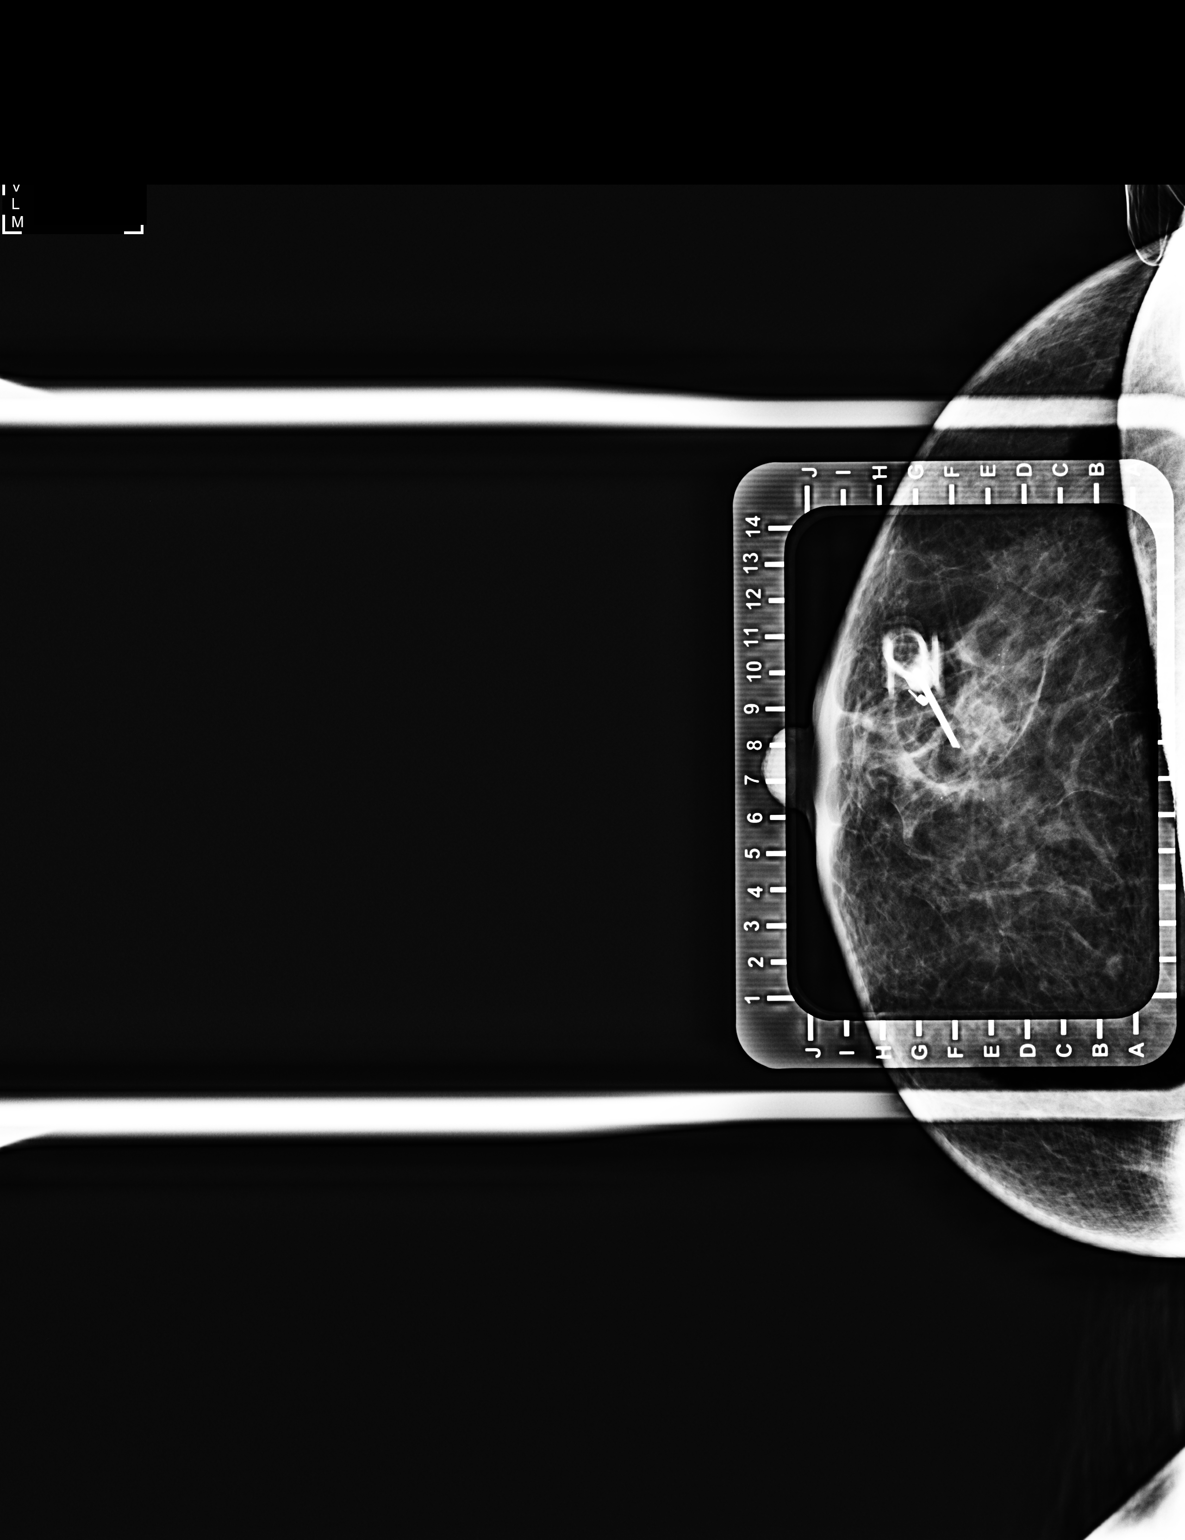

[R ML (2 of 2)]
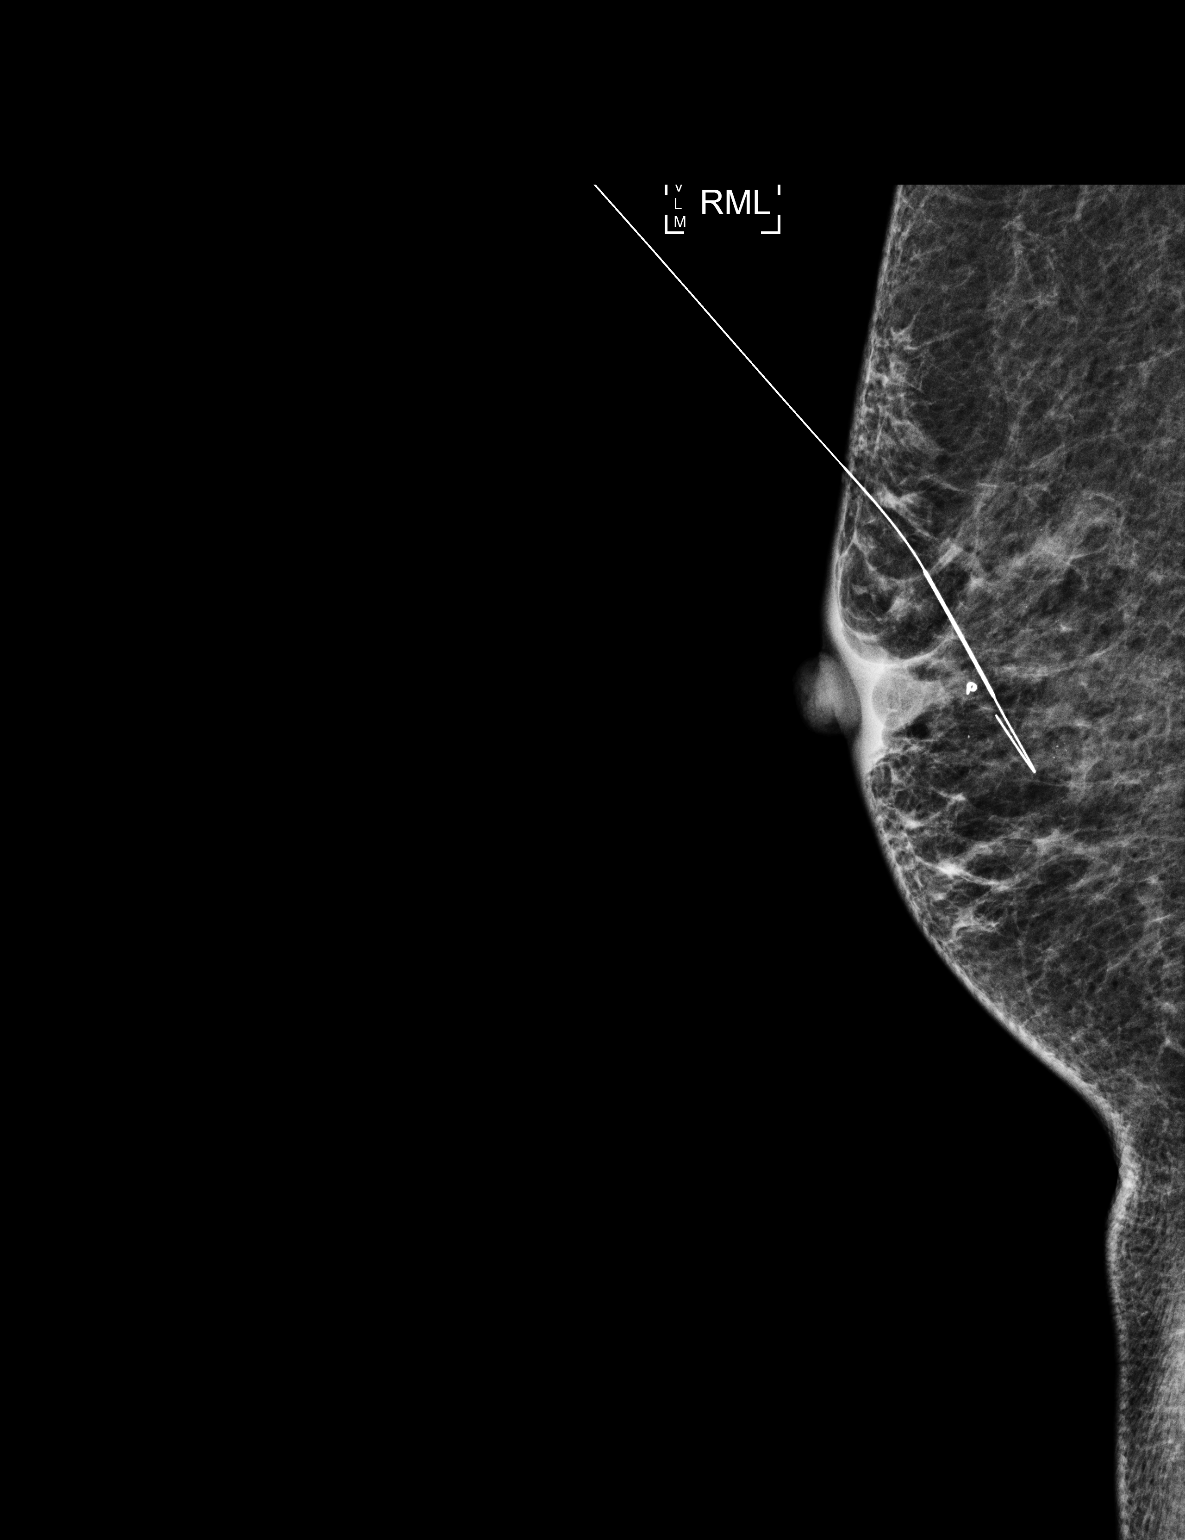

[3 of 3 positions shown; findings below may reference images not displayed]

FINDINGS: Patient presents for needle localization prior to right breast
excision. I met with the patient and we discussed the procedure of
needle localization including benefits and alternatives. We
discussed the high likelihood of a successful procedure. We
discussed the risks of the procedure, including infection, bleeding,
tissue injury, and further surgery. Informed, written consent was
given. The usual time-out protocol was performed immediately prior
to the procedure.

Using mammographic guidance, sterile technique, 1% lidocaine and a 5
cm modified Kopans needle, the recently placed coil shaped biopsy
marker clip in the outer retroareolar right breast was localized
using superior approach. The images were marked for Dr. Jaylon.
IMPRESSION: Needle localization right breast. No apparent complications.

## 2017-01-18 MED ORDER — AMOXICILLIN 875 MG PO TABS: 875.0000 mg | ORAL_TABLET | Freq: Two times a day (BID) | ORAL | 0 refills | Status: DC

## 2017-01-21 LAB — CULTURE, URINE COMPREHENSIVE

## 2017-01-22 ENCOUNTER — Telehealth: Payer: Self-pay

## 2017-01-22 ENCOUNTER — Telehealth: Payer: Self-pay | Admitting: Urology

## 2017-01-22 NOTE — Telephone Encounter (Signed)
Called patient. No answer. Left vmail per DPR.

## 2017-01-22 NOTE — Telephone Encounter (Signed)
-----   Message from Nori Riis, PA-C sent at 01/21/2017  1:50 PM EDT ----- Please let Felicia Hughes know that her urine culture is positive, but it is with a different bacteria than her last culture.

## 2017-01-22 NOTE — Telephone Encounter (Signed)
Patient called you back please call her at [place]

## 2017-01-27 ENCOUNTER — Ambulatory Visit (INDEPENDENT_AMBULATORY_CARE_PROVIDER_SITE_OTHER): Payer: Medicare Other

## 2017-01-27 VITALS — BP 162/77 | HR 87 | Ht 60.0 in | Wt 99.0 lb

## 2017-01-27 DIAGNOSIS — N390 Urinary tract infection, site not specified: Secondary | ICD-10-CM | POA: Diagnosis not present

## 2017-01-27 LAB — MICROSCOPIC EXAMINATION: Epithelial Cells (non renal): NONE SEEN /hpf (ref 0–10)

## 2017-01-27 LAB — URINALYSIS, COMPLETE
Bilirubin, UA: NEGATIVE
Glucose, UA: NEGATIVE
Ketones, UA: NEGATIVE
Nitrite, UA: POSITIVE — AB
PH UA: 6.5 (ref 5.0–7.5)
Protein, UA: NEGATIVE
Specific Gravity, UA: 1.01 (ref 1.005–1.030)
Urobilinogen, Ur: 0.2 mg/dL (ref 0.2–1.0)

## 2017-01-27 MED ORDER — FOSFOMYCIN TROMETHAMINE 3 G PO PACK: 3.0000 g | PACK | Freq: Once | ORAL | Status: DC

## 2017-01-27 NOTE — Addendum Note (Signed)
Addended by: Toniann Fail C on: 01/27/2017 03:10 PM   Modules accepted: Orders

## 2017-01-27 NOTE — Progress Notes (Addendum)
Pt presents today with c/o urinary frequency and urgency, hard to postpone urination, leakage of urine, back pain, lower abd pain, nausea, and night sweats. A CATH specimen was obtained for u/a and cx.  Blood pressure (!) 162/77, pulse 87, height 5' (1.524 m), weight 99 lb (44.9 kg).  Per Lisette Grinder has been sent to pharmacy.

## 2017-01-29 NOTE — Progress Notes (Deleted)
02/01/2017 11:51 AM   Niger Ayler Sep 26, 1933 [id number]  Referring provider: Tracie Harrier, [facility] [place], [place] [address]  No chief complaint on file.   HPI: 83 yo WF with recurrent UTI's, vaginal atrophy, suprapubic pain and urinary frequency who presents today for a 3 week follow up after being tried on Myrbetriq 25 mg daily.    Background history Patient is a 26 -year-old Caucasian female who is referred to [place] by, Dr. Ginette Pitman, for recurrent urinary tract infections.  Patient states that she has had several urinary tract infections over the last year.  Reviewing her records,  she has had 6 documented positive urine cultures.  She is a former patient of Dr. Bernardo Heater and was last seen by him in 03/2016.  She was managed on methenamine and oxybutynin.  Her symptoms with a urinary tract infection consist of frequency, urgency and suprapubic pain.  She is currently on Ceftin at this time.  Last urine culture performed on 12/10/2016 was negative.  Today, she is experiencing frequency x 1 1/2 hour, urgency is strong, nocturia x 4, incontinence (wearing one thin pad daily, SUI and UI), intermittency, hesitancy and straining to urinate.  She endorses suprapubic pain, but she denies dysuria, gross hematuria, back pain, abdominal pain and flank pain.  She has not had any recent fevers, chills, nausea or vomiting.  Her UA was negative.  This was a CATH specimen.  Her PVR was 35 mL.  She does not have a history of nephrolithiasis, GU surgery or GU trauma.  She has not been sexually active for over one year.  She is postmenopausal.   She admits to constipation.  She has a rectocele.   She is taking a stool softener daily.  She is having daily BM's.  She does engage in good perineal hygiene. She does not take tub baths.   She is having pain with bladder filling.   Contrast CT performed on 05/25/2016 for unintentional weight loss noted bilateral renal scarring.  She is  drinking 3 to 4 cups of water daily.   1 diet Coca-Cola daily.  1 cup of coffee daily.  1 glass of wine daily.   Reviewed referral notes - patient is on Hiprex (quit taking the medication - didn't feel it was helping), vaginal estrogen cream (not consistently), oxybutynin (still taking) and tropsium (not taking)    At her visit, 2 weeks ago, she was encouraged to increase her water intake and to abstain from caffeinated beverages.  She was also encouraged to use the vaginal estrogen cream consistently.    At her visit on 12/28/2016, she was been experiencing urgency x 4-7, frequency x 8 or more, not restricting fluids to avoid visits to the restroom, is engaging in toilet mapping, incontinence x 4-7 and nocturia x 4-7.  Her PVR was 130 mL.   She is not having dysuria or gross hematuria.  She is not having fevers, chills, nausea or vomiting.   CATH UA is positive for 11-30 WBC's.   Nothing helps the urinary symptoms. Nothing makes the urinary symptoms worse.  She was given a trial of Myrbetriq, but she went into urinary retention and had to have a Foley placed.    Today, she has removed her own Foley that night.  She experiencing frequency, urgency, nocturia, incontinence, intermittency, hesitancy, straining to urinate and a weak urinary stream.  She is having urgency x 0-3, frequency x 8 or more, not restricting fluids to  avoid visits to the restroom, is engaging in toilet mapping, incontinence x 8 or more and nocturia x 4-7.    She is still experiencing suprapubic discomfort and dysuria.  Her CATH UA today is positive for 11-30 WBCs, many bacteria and nitrite positive.Marland Kitchen   Her PVR was 280 mL.    PMH: Past Medical History:  Diagnosis Date  . Arrhythmia   . Chronic kidney disease    UTI  . GERD (gastroesophageal reflux disease)   . Glaucoma (increased eye pressure)   . Hypercholesterolemia   . Hypertension     Surgical History: Past Surgical History:  Procedure Laterality Date  . ABDOMINAL  HYSTERECTOMY    . BREAST EXCISIONAL BIOPSY Right 06/11/2015   NEG  . BREAST SURGERY Right    Breast Needle Biopsy  . CHOLECYSTECTOMY    . DILATION AND CURETTAGE OF UTERUS    . EYE SURGERY Bilateral    Cataract Extraction with IOL  . LAPAROSCOPIC OOPHORECTOMY      Home Medications:  Allergies as of 02/01/2017      Reactions   Nitrofurantoin Hives   Sulfa Antibiotics Rash      Medication List        Accurate as of 01/29/17 11:51 AM. Always use your most recent med list.          amoxicillin 875 MG tablet Commonly known as:  AMOXIL Take 1 tablet (875 mg total) by mouth every 12 (twelve) hours.   aspirin EC 81 MG tablet Take 81 mg by mouth daily.   atorvastatin 20 MG tablet Commonly known as:  LIPITOR Take 20 mg by mouth daily at 6 PM.   cefUROXime 250 MG tablet Commonly known as:  CEFTIN Take 250 mg by mouth 2 (two) times daily with a meal.   CENTRUM SILVER ULTRA WOMENS Tabs Take 1 tablet by mouth daily.   ICAPS AREDS FORMULA PO Take 1 capsule by mouth 2 (two) times daily.   cetirizine 10 MG tablet Commonly known as:  ZYRTEC Take 10 mg by mouth daily.   cholecalciferol 1000 units tablet Commonly known as:  VITAMIN D Take 1,000 Units by mouth daily.   docusate sodium 100 MG capsule Commonly known as:  COLACE Take 100 mg by mouth at bedtime.   estradiol 0.1 MG/GM vaginal cream Commonly known as:  ESTRACE VAGINAL Apply 0.63m (pea-sized amount)  just inside the vaginal introitus with a finger-tip every night for two weeks and then Monday, Wednesday and Friday nights.   HYDROcodone-acetaminophen 5-325 MG tablet Commonly known as:  NORCO Take 1-2 tablets by mouth every 4 (four) hours as needed for moderate pain.   losartan 50 MG tablet Commonly known as:  COZAAR Take 50 mg by mouth daily.   mirabegron ER 25 MG Tb24 tablet Commonly known as:  MYRBETRIQ Take 1 tablet (25 mg total) by mouth daily.   omeprazole 20 MG capsule Commonly known as:   PRILOSEC Take 20 mg by mouth daily.   oxybutynin 5 MG tablet Commonly known as:  DITROPAN Take 5 mg by mouth 3 (three) times daily as needed for bladder spasms.   PROBIOTIC FORMULA PO Take 1 capsule by mouth daily.   zolpidem 10 MG tablet Commonly known as:  AMBIEN Take 10 mg by mouth at bedtime as needed for sleep.       Allergies:  Allergies  Allergen Reactions  . Nitrofurantoin Hives  . Sulfa Antibiotics Rash    Family History: Family History  Problem Relation Age of  Onset  . Breast cancer Mother 105       early 16's  . Kidney cancer Neg Hx   . Kidney disease Neg Hx   . Prostate cancer Neg Hx   . Bladder Cancer Neg Hx     Social History:  reports that  has never smoked. she has never used smokeless tobacco. She reports that she drinks about 0.6 oz of alcohol per week. She reports that she does not use drugs.  ROS:                                        Physical Exam: There were no vitals taken for this visit.  Constitutional: Well nourished. Alert and oriented, No acute distress. HEENT: [place] AT, moist mucus membranes. Trachea midline, no masses. Cardiovascular: No clubbing, cyanosis, or edema. Respiratory: Normal respiratory effort, no increased work of breathing. Skin: No rashes, bruises or suspicious lesions. Lymph: No cervical or inguinal adenopathy. Neurologic: Grossly intact, no focal deficits, moving all 4 extremities. Psychiatric: Normal mood and affect.  Laboratory Data: Urinalysis CATH UA  Positive for 11-30 WBC's.   Many bacteria.  See EPIC.    I have reviewed the labs.   Assessment & Plan:    1. Recurrent UTI's  - criteria for recurrent UTI has been met with 2 or more infections in 6 months or 3 or greater infections in one year   - encouraged the patient to increase their water intake until the urine is pale yellow or clear (10 to 12 cups daily) - she states she is drinking 3 to 4 - 500 mL bottles of water daily   -  probiotics (yogurt, oral pills or vaginal suppositories), take cranberry pills or drink the juice and Vitamin C 1,000 mg daily to acidify the urine should be added to their daily regimen - she has not been taking these supplements  - will restart the Hiprex - one gram bid  - CATH UA suspicious for infection, will send for culture - start amoxicillin empirically - will adjust once culture is available  2. Vaginal atrophy  - She will follow up in three months for an exam.    - Continue vaginal estrogen cream Monday, Wednesday and Friday evenings  3. Incomplete bladder emptying  - she states that the only thing that helps her bladder is dilation  - will schedule urethral dilation once UTI has cleared                                   No Follow-up on file.  These notes generated with voice recognition software. I apologize for typographical errors.  Zara Council, [facility] [address], [place], [place] [address] [phone]

## 2017-02-01 ENCOUNTER — Ambulatory Visit: Payer: Medicare Other | Admitting: Urology

## 2017-02-01 ENCOUNTER — Encounter: Payer: Self-pay | Admitting: Urology

## 2017-02-02 ENCOUNTER — Telehealth: Payer: Self-pay

## 2017-02-02 LAB — CULTURE, URINE COMPREHENSIVE

## 2017-02-02 MED ORDER — AMOXICILLIN-POT CLAVULANATE 875-125 MG PO TABS: 1.0000 | ORAL_TABLET | Freq: Two times a day (BID) | ORAL | 0 refills | Status: DC

## 2017-02-02 NOTE — Telephone Encounter (Signed)
-----   Message from Nori Riis, PA-C sent at 02/02/2017  2:12 PM EST ----- Please let Felicia Hughes know that her urine culture is positive and the ampicillin may be ineffective in treating her urine culture.  Are her symptoms better?

## 2017-02-02 NOTE — Telephone Encounter (Signed)
Spoke with pt in reference to monurol. Pt stated that she did take it and she feels some better, but doesn't feel like her infection is completely gone. Pt stated that she leaves for [place] the Wednesday before Thanksgiving and would like to be dilated before she leaves. Please advise.

## 2017-02-02 NOTE — Telephone Encounter (Signed)
LMOM- augmentin sent to pharmacy. Will need to check urine again. Will not be able to do anything before her vacation.

## 2017-02-02 NOTE — Telephone Encounter (Signed)
I cannot dilate her until her infection is gone.  She needs to start Augmentin 875/125, one tablet bid for seven days.  We then need to check her urine to make sure the infection has cleared.  We will not be able to dilate her before the Thanksgiving holidays.

## 2017-02-03 NOTE — Telephone Encounter (Signed)
Spoke with pt in reference to augmentin. Pt was very demanding that augmentin would not work for her. Reinforced with pt that per ucx the organism is susceptible to augmentin. Per Larene Beach if pt does not want to take augmentin can come in today for a cath specimen to see if infection is gone to have dilation. Pt then demanded to see Dr. Bernardo Heater as she was seeing him in [place]. Made pt aware Dr. Bernardo Heater does not have an opening until Monday. Pt stated that she would think about taking the augmentin and wanted the app on Monday. Appt was made.

## 2017-02-08 ENCOUNTER — Ambulatory Visit: Payer: Medicare Other | Admitting: Urology

## 2017-02-08 ENCOUNTER — Encounter: Payer: Self-pay | Admitting: Urology

## 2017-02-08 VITALS — BP 154/71 | HR 99 | Ht 60.0 in | Wt 98.0 lb

## 2017-02-08 DIAGNOSIS — N390 Urinary tract infection, site not specified: Secondary | ICD-10-CM | POA: Diagnosis not present

## 2017-02-08 LAB — URINALYSIS, COMPLETE
BILIRUBIN UA: NEGATIVE
GLUCOSE, UA: NEGATIVE
KETONES UA: NEGATIVE
LEUKOCYTES UA: NEGATIVE
Nitrite, UA: NEGATIVE
PROTEIN UA: NEGATIVE
RBC UA: NEGATIVE
SPEC GRAV UA: 1.01 (ref 1.005–1.030)
Urobilinogen, Ur: 0.2 mg/dL (ref 0.2–1.0)
pH, UA: 6.5 (ref 5.0–7.5)

## 2017-02-08 LAB — MICROSCOPIC EXAMINATION
BACTERIA UA: NONE SEEN
Epithelial Cells (non renal): NONE SEEN /hpf (ref 0–10)

## 2017-02-08 MED ORDER — OXYBUTYNIN CHLORIDE 5 MG PO TABS: 5.0000 mg | ORAL_TABLET | Freq: Three times a day (TID) | ORAL | 3 refills | Status: DC | PRN

## 2017-02-08 MED ORDER — CEFUROXIME AXETIL 250 MG PO TABS: 250.0000 mg | ORAL_TABLET | Freq: Two times a day (BID) | ORAL | 0 refills | Status: DC

## 2017-02-09 NOTE — Progress Notes (Signed)
02/08/2017 9:50 PM   Felicia Hughes 1933/05/21 [id number]  Referring provider: Tracie Harrier, MD [address] [facility] [place], [place] [address]  Chief Complaint  Patient presents with  . Recurrent UTI    HPI: 83 year old female with a history of recurrent lower tract UTIs and negative evaluation including upper tract imaging and cystoscopy.  She was recently treated for an E. coli UTI and states she is getting ready to fly to [place] this week and wanted to see if her infection has cleared she is still having mild symptoms.  She denies fever, chills or gross hematuria.   PMH: Past Medical History:  Diagnosis Date  . Arrhythmia   . Chronic kidney disease    UTI  . GERD (gastroesophageal reflux disease)   . Glaucoma (increased eye pressure)   . Hypercholesterolemia   . Hypertension     Surgical History: Past Surgical History:  Procedure Laterality Date  . ABDOMINAL HYSTERECTOMY    . BREAST BIOPSY Right 06/11/2015   Procedure: BREAST BIOPSY WITH NEEDLE LOCALIZATION;  Surgeon: Leonie Green, MD;  Location: [facility] ORS;  Service: General;  Laterality: Right;  . BREAST EXCISIONAL BIOPSY Right 06/11/2015   NEG  . BREAST SURGERY Right    Breast Needle Biopsy  . CHOLECYSTECTOMY    . DILATION AND CURETTAGE OF UTERUS    . EYE SURGERY Bilateral    Cataract Extraction with IOL  . LAPAROSCOPIC OOPHORECTOMY      Home Medications:  Allergies as of 02/08/2017      Reactions   Nitrofurantoin Hives   Sulfa Antibiotics Rash      Medication List        Accurate as of 02/08/17 11:59 PM. Always use your most recent med list.          amoxicillin 875 MG tablet Commonly known as:  AMOXIL Take 1 tablet (875 mg total) by mouth every 12 (twelve) hours.   amoxicillin-clavulanate 875-125 MG tablet Commonly known as:  AUGMENTIN Take 1 tablet every 12 (twelve) hours by mouth.   aspirin EC 81 MG tablet Take 81 mg by mouth daily.   atorvastatin 20 MG  tablet Commonly known as:  LIPITOR Take 20 mg by mouth daily at 6 PM.   cefUROXime 250 MG tablet Commonly known as:  CEFTIN Take 1 tablet (250 mg total) 2 (two) times daily with a meal by mouth.   CENTRUM SILVER ULTRA WOMENS Tabs Take 1 tablet by mouth daily.   ICAPS AREDS FORMULA PO Take 1 capsule by mouth 2 (two) times daily.   cetirizine 10 MG tablet Commonly known as:  ZYRTEC Take 10 mg by mouth daily.   cholecalciferol 1000 units tablet Commonly known as:  VITAMIN D Take 1,000 Units by mouth daily.   docusate sodium 100 MG capsule Commonly known as:  COLACE Take 100 mg by mouth at bedtime.   estradiol 0.1 MG/GM vaginal cream Commonly known as:  ESTRACE VAGINAL Apply 0.5mg  (pea-sized amount)  just inside the vaginal introitus with a finger-tip every night for two weeks and then Monday, Wednesday and Friday nights.   HYDROcodone-acetaminophen 5-325 MG tablet Commonly known as:  NORCO Take 1-2 tablets by mouth every 4 (four) hours as needed for moderate pain.   losartan 50 MG tablet Commonly known as:  COZAAR Take 50 mg by mouth daily.   mirabegron ER 25 MG Tb24 tablet Commonly known as:  MYRBETRIQ Take 1 tablet (25 mg total) by mouth daily.   omeprazole 20  MG capsule Commonly known as:  PRILOSEC Take 20 mg by mouth daily.   oxybutynin 5 MG tablet Commonly known as:  DITROPAN Take 1 tablet (5 mg total) 3 (three) times daily as needed by mouth for bladder spasms.   PROBIOTIC FORMULA PO Take 1 capsule by mouth daily.   zolpidem 10 MG tablet Commonly known as:  AMBIEN Take 10 mg by mouth at bedtime as needed for sleep.       Allergies:  Allergies  Allergen Reactions  . Nitrofurantoin Hives  . Sulfa Antibiotics Rash    Family History: Family History  Problem Relation Age of Onset  . Breast cancer Mother 36       early 64's  . Kidney cancer Neg Hx   . Kidney disease Neg Hx   . Prostate cancer Neg Hx   . Bladder Cancer Neg Hx     Social  History:  reports that  has never smoked. she has never used smokeless tobacco. She reports that she drinks about 0.6 oz of alcohol per week. She reports that she does not use drugs.  ROS: UROLOGY Frequent Urination?: Yes Hard to postpone urination?: Yes Burning/pain with urination?: No Get up at night to urinate?: Yes Leakage of urine?: Yes Urine stream starts and stops?: Yes Trouble starting stream?: Yes Do you have to strain to urinate?: Yes Blood in urine?: No Urinary tract infection?: Yes Sexually transmitted disease?: No Injury to kidneys or bladder?: No Painful intercourse?: No Weak stream?: Yes Currently pregnant?: No Vaginal bleeding?: No Last menstrual period?: n  Gastrointestinal Nausea?: No Vomiting?: No Indigestion/heartburn?: No Diarrhea?: No Constipation?: Yes  Constitutional Fever: No Night sweats?: Yes Weight loss?: Yes Fatigue?: Yes  Skin Skin rash/lesions?: No Itching?: No  Eyes Blurred vision?: No Double vision?: No  Ears/Nose/Throat Sore throat?: No Sinus problems?: Yes  Hematologic/Lymphatic Swollen glands?: No Easy bruising?: No  Cardiovascular Leg swelling?: No Chest pain?: No  Respiratory Cough?: No Shortness of breath?: No  Endocrine Excessive thirst?: No  Musculoskeletal Back pain?: Yes Joint pain?: No  Neurological Headaches?: No Dizziness?: No  Psychologic Depression?: No Anxiety?: Yes  Physical Exam: BP (!) 154/71   Pulse 99   Ht 5' (1.524 m)   Wt 98 lb (44.5 kg)   BMI 19.14 kg/m   Constitutional:  Alert and oriented, No acute distress. HEENT: [place] AT, moist mucus membranes.  Trachea midline, no masses. Cardiovascular: No clubbing, cyanosis, or edema. Respiratory: Normal respiratory effort, no increased work of breathing. GI: Abdomen is soft, nontender, nondistended, no abdominal masses GU: No CVA tenderness. Skin: No rashes, bruises or suspicious lesions. Lymph: No cervical or inguinal  adenopathy. Neurologic: Grossly intact, no focal deficits, moving all 4 extremities. Psychiatric: Normal mood and affect.  Laboratory Data:  Urinalysis Lab Results  Component Value Date   SPECGRAV 1.010 02/08/2017   PHUR 6.5 02/08/2017   COLORU Yellow 02/08/2017   APPEARANCEUR Clear 02/08/2017   LEUKOCYTESUR Negative 02/08/2017   PROTEINUR Negative 02/08/2017   GLUCOSEU Negative 02/08/2017   KETONESU Negative 02/08/2017   RBCU Negative 02/08/2017   BILIRUBINUR Negative 02/08/2017   UUROB 0.2 02/08/2017   NITRITE Negative 02/08/2017    Lab Results  Component Value Date   LABMICR See below: 02/08/2017   WBCUA 0-5 02/08/2017   RBCUA 0-2 02/08/2017   LABEPIT None seen 02/08/2017   BACTERIA None seen 02/08/2017     Assessment & Plan:   1. Recurrent UTI Urinalysis today was clear and was sent for culture.  It  may not be back before she leaves for [place] and she was given an Rx for cefuroxime to have on hand.  - Urinalysis, Complete - CULTURE, URINE COMPREHENSIVE   No Follow-up on file.  Felicia Hughes, [place] [address] [place], [place] [address] [phone]

## 2017-02-11 LAB — CULTURE, URINE COMPREHENSIVE

## 2017-03-29 ENCOUNTER — Ambulatory Visit (INDEPENDENT_AMBULATORY_CARE_PROVIDER_SITE_OTHER): Payer: Medicare Other

## 2017-03-29 VITALS — BP 146/80 | HR 85 | Ht 60.0 in | Wt 97.0 lb

## 2017-03-29 DIAGNOSIS — N390 Urinary tract infection, site not specified: Secondary | ICD-10-CM | POA: Diagnosis not present

## 2017-03-29 LAB — URINALYSIS, COMPLETE
BILIRUBIN UA: NEGATIVE
Glucose, UA: NEGATIVE
KETONES UA: NEGATIVE
NITRITE UA: POSITIVE — AB
Protein, UA: NEGATIVE
SPEC GRAV UA: 1.015 (ref 1.005–1.030)
UUROB: 0.2 mg/dL (ref 0.2–1.0)
pH, UA: 6 (ref 5.0–7.5)

## 2017-03-29 LAB — MICROSCOPIC EXAMINATION
EPITHELIAL CELLS (NON RENAL): NONE SEEN /HPF (ref 0–10)
WBC, UA: >30 /hpf — ABNORMAL HIGH (ref 0–?)

## 2017-03-29 MED ORDER — CEFUROXIME AXETIL 250 MG PO TABS: 250.0000 mg | ORAL_TABLET | Freq: Two times a day (BID) | ORAL | 0 refills | Status: DC

## 2017-03-29 NOTE — Addendum Note (Signed)
Addended by: Toniann Fail C on: 03/29/2017 11:39 AM   Modules accepted: Orders

## 2017-03-29 NOTE — Progress Notes (Addendum)
Pt presents today with c/o urinary frequency, urgency, hard to postpone urination, leakage of urine, lower abd pain, and night sweats. A CATH specimen was obtained for u/a and cx.   Blood pressure (!) 146/80, pulse 85, height 5' (1.524 m), weight 97 lb (44 kg).  Per Larene Beach ceftin 250mg  bid x7 was sent to pharmacy.

## 2017-03-31 ENCOUNTER — Other Ambulatory Visit: Payer: Self-pay | Admitting: Internal Medicine

## 2017-03-31 DIAGNOSIS — Z1231 Encounter for screening mammogram for malignant neoplasm of breast: Secondary | ICD-10-CM

## 2017-04-01 ENCOUNTER — Telehealth: Payer: Self-pay

## 2017-04-01 LAB — CULTURE, URINE COMPREHENSIVE

## 2017-04-01 NOTE — Telephone Encounter (Signed)
-----   Message from Nori Riis, PA-C sent at 04/01/2017  1:24 PM EST ----- Patient's urine culture is positive.  She needs to finish the Ceftin.

## 2017-04-01 NOTE — Telephone Encounter (Signed)
Pt.notified

## 2017-06-26 ENCOUNTER — Emergency Department
Admission: EM | Admit: 2017-06-26 | Discharge: 2017-06-26 | Disposition: A | Discharge status: Home / Self Care | Payer: Medicare Other | Attending: Emergency Medicine | Admitting: Emergency Medicine

## 2017-06-26 ENCOUNTER — Other Ambulatory Visit: Payer: Self-pay

## 2017-06-26 ENCOUNTER — Encounter: Payer: Self-pay | Admitting: Emergency Medicine

## 2017-06-26 DIAGNOSIS — N3090 Cystitis, unspecified without hematuria: Secondary | ICD-10-CM | POA: Diagnosis not present

## 2017-06-26 DIAGNOSIS — N189 Chronic kidney disease, unspecified: Secondary | ICD-10-CM | POA: Diagnosis not present

## 2017-06-26 DIAGNOSIS — Z79899 Other long term (current) drug therapy: Secondary | ICD-10-CM | POA: Diagnosis not present

## 2017-06-26 DIAGNOSIS — I129 Hypertensive chronic kidney disease with stage 1 through stage 4 chronic kidney disease, or unspecified chronic kidney disease: Secondary | ICD-10-CM | POA: Diagnosis not present

## 2017-06-26 DIAGNOSIS — R300 Dysuria: Secondary | ICD-10-CM | POA: Diagnosis present

## 2017-06-26 DIAGNOSIS — Z7982 Long term (current) use of aspirin: Secondary | ICD-10-CM | POA: Diagnosis not present

## 2017-06-26 LAB — URINALYSIS, COMPLETE (UACMP) WITH MICROSCOPIC
BILIRUBIN URINE: NEGATIVE
Glucose, UA: NEGATIVE mg/dL
Ketones, ur: 5 mg/dL — AB
NITRITE: NEGATIVE
PH: 5 (ref 5.0–8.0)
Protein, ur: 100 mg/dL — AB
SQUAMOUS EPITHELIAL / LPF: NONE SEEN
Specific Gravity, Urine: 1.016 (ref 1.005–1.030)

## 2017-06-26 MED ORDER — CEPHALEXIN 500 MG PO CAPS: 500.0000 mg | ORAL_CAPSULE | Freq: Four times a day (QID) | ORAL | 0 refills | Status: AC

## 2017-06-26 NOTE — ED Triage Notes (Signed)
Pain with urination began 1 hour ago. States history of UTIs and feels same.

## 2017-06-26 NOTE — ED Provider Notes (Signed)
[facility] Emergency Department Provider Note  ____________________________________________  Time seen: Approximately 7:12 PM  I have reviewed the triage vital signs and the nursing notes.   HISTORY  Chief Complaint Dysuria    HPI Felicia Hughes is a 84 y.o. female presents to the emergency department with dysuria and increased urinary frequency for the past 1 hour.  Patient reports a history of recurrent urinary tract infections and she states that her symptoms feel similar to prior episodes of cystitis.  She denies flank pain, nausea, fever and chills.  She denies a history of nephrolithiasis.  Patient reports receiving ciprofloxacin for her last episode of cystitis.  Patient has attempted no other alleviating measures.   Past Medical History:  Diagnosis Date  . Arrhythmia   . Chronic kidney disease    UTI  . GERD (gastroesophageal reflux disease)   . Glaucoma (increased eye pressure)   . Hypercholesterolemia   . Hypertension     There are no active problems to display for this patient.   Past Surgical History:  Procedure Laterality Date  . ABDOMINAL HYSTERECTOMY    . BREAST BIOPSY Right 06/11/2015   Procedure: BREAST BIOPSY WITH NEEDLE LOCALIZATION;  Surgeon: Leonie Green, MD;  Location: [facility] ORS;  Service: General;  Laterality: Right;  . BREAST EXCISIONAL BIOPSY Right 06/11/2015   NEG  . BREAST SURGERY Right    Breast Needle Biopsy  . CHOLECYSTECTOMY    . DILATION AND CURETTAGE OF UTERUS    . EYE SURGERY Bilateral    Cataract Extraction with IOL  . LAPAROSCOPIC OOPHORECTOMY      Prior to Admission medications   Medication Sig Start Date End Date Taking? Authorizing Provider  amoxicillin (AMOXIL) 875 MG tablet Take 1 tablet (875 mg total) by mouth every 12 (twelve) hours. 01/18/17   Zara Council A, PA-C  amoxicillin-clavulanate (AUGMENTIN) 875-125 MG tablet Take 1 tablet every 12 (twelve) hours by mouth. 02/02/17   McGowan, Hunt Oris, PA-C  aspirin EC 81 MG tablet Take 81 mg by mouth daily.    [provider]  atorvastatin (LIPITOR) 20 MG tablet Take 20 mg by mouth daily at 6 PM.    [provider]  cefUROXime (CEFTIN) 250 MG tablet Take 1 tablet (250 mg total) 2 (two) times daily with a meal by mouth. 02/08/17   Stoioff, Ronda Fairly, MD  cefUROXime (CEFTIN) 250 MG tablet Take 1 tablet (250 mg total) by mouth 2 (two) times daily with a meal. 03/29/17   McGowan, Larene Beach A, PA-C  cephALEXin (KEFLEX) 500 MG capsule Take 1 capsule (500 mg total) by mouth 4 (four) times daily for 10 days. 06/26/17 07/06/17  Lannie Fields, PA-C  cetirizine (ZYRTEC) 10 MG tablet Take 10 mg by mouth daily.    [provider]  cholecalciferol (VITAMIN D) 1000 units tablet Take 1,000 Units by mouth daily.    [provider]  docusate sodium (COLACE) 100 MG capsule Take 100 mg by mouth at bedtime.    [provider]  estradiol (ESTRACE VAGINAL) 0.1 MG/GM vaginal cream Apply 0.5mg  (pea-sized amount)  just inside the vaginal introitus with a finger-tip every night for two weeks and then Monday, Wednesday and Friday nights. 12/14/16   Zara Council A, PA-C  HYDROcodone-acetaminophen (NORCO) 5-325 MG tablet Take 1-2 tablets by mouth every 4 (four) hours as needed for moderate pain. 06/11/15   Leonie Green, MD  losartan (COZAAR) 50 MG tablet Take 50 mg by mouth daily.  [provider]  mirabegron ER (MYRBETRIQ) 25 MG TB24 tablet Take 1 tablet (25 mg total) by mouth daily. 12/28/16   Zara Council A, PA-C  Multiple Vitamins-Minerals (CENTRUM SILVER ULTRA WOMENS) TABS Take 1 tablet by mouth daily.    [provider]  Multiple Vitamins-Minerals (ICAPS AREDS FORMULA PO) Take 1 capsule by mouth 2 (two) times daily.    [provider]  omeprazole (PRILOSEC) 20 MG capsule Take 20 mg by mouth daily.    [provider]  oxybutynin (DITROPAN) 5 MG tablet Take 1 tablet (5 mg total) 3  (three) times daily as needed by mouth for bladder spasms. 02/08/17   Stoioff, Ronda Fairly, MD  Probiotic Product (PROBIOTIC FORMULA PO) Take 1 capsule by mouth daily.    [provider]  zolpidem (AMBIEN) 10 MG tablet Take 10 mg by mouth at bedtime as needed for sleep.    [provider]    Allergies Nitrofurantoin and Sulfa antibiotics  Family History  Problem Relation Age of Onset  . Breast cancer Mother 89       early 79's  . Kidney cancer Neg Hx   . Kidney disease Neg Hx   . Prostate cancer Neg Hx   . Bladder Cancer Neg Hx     Social History Social History   Tobacco Use  . Smoking status: Never Smoker  . Smokeless tobacco: Never Used  Substance Use Topics  . Alcohol use: Yes    Alcohol/week: 0.6 oz    Types: 1 Glasses of wine per week    Comment: wine daily  . Drug use: No     Review of Systems  Constitutional: No fever/chills Eyes: No visual changes. No discharge ENT: No upper respiratory complaints. Cardiovascular: no chest pain. Respiratory: no cough. No SOB. Gastrointestinal: No abdominal pain.  No nausea, no vomiting.  No diarrhea.  No constipation. Genitourinary: Patient has dysuria and increased urinary frequency. Musculoskeletal: Negative for musculoskeletal pain. Skin: Negative for rash, abrasions, lacerations, ecchymosis. Neurological: Negative for headaches, focal weakness or numbness.   ____________________________________________   PHYSICAL EXAM:  VITAL SIGNS: ED Triage Vitals  Enc Vitals Group     BP 06/26/17 1446 140/63     Pulse Rate 06/26/17 1446 88     Resp 06/26/17 1446 18     Temp 06/26/17 1446 97.7 F (36.5 C)     Temp Source 06/26/17 1446 Oral     SpO2 06/26/17 1446 96 %     Weight 06/26/17 1447 97 lb (44 kg)     Height 06/26/17 1447 5' (1.524 m)     Head Circumference --      Peak Flow --      Pain Score 06/26/17 1447 9     Pain Loc --      Pain Edu? --      Excl. in [place]? --      Constitutional: Alert and  oriented. Well appearing and in no acute distress. Eyes: Conjunctivae are normal. PERRL. EOMI. Head: Atraumatic. Cardiovascular: Normal rate, regular rhythm. Normal S1 and S2.  Good peripheral circulation. Respiratory: Normal respiratory effort without tachypnea or retractions. Lungs CTAB. Good air entry to the bases with no decreased or absent breath sounds. Gastrointestinal: Bowel sounds 4 quadrants. Soft and nontender to palpation. No guarding or rigidity. No palpable masses. No distention. No CVA tenderness. Musculoskeletal: Full range of motion to all extremities. No gross deformities appreciated. Neurologic:  Normal speech and language. No gross focal neurologic deficits are appreciated.  Skin:  Skin is warm, dry and intact. No rash noted. Psychiatric: Mood and affect are normal. Speech and behavior are normal. Patient exhibits appropriate insight and judgement.   ____________________________________________   LABS (all labs ordered are listed, but only abnormal results are displayed)  Labs Reviewed  URINALYSIS, COMPLETE (UACMP) WITH MICROSCOPIC - Abnormal; Notable for the following components:      Result Value   Color, Urine AMBER (*)    APPearance CLOUDY (*)    Hgb urine dipstick MODERATE (*)    Ketones, ur 5 (*)    Protein, ur 100 (*)    Leukocytes, UA MODERATE (*)    Bacteria, UA RARE (*)    All other components within normal limits   ____________________________________________  EKG   ____________________________________________  RADIOLOGY   No results found.  ____________________________________________    PROCEDURES  Procedure(s) performed:    Procedures    Medications - No data to display   ____________________________________________   INITIAL IMPRESSION / ASSESSMENT AND PLAN / ED COURSE  Pertinent labs & imaging results that were available during my care of the patient were reviewed by me and considered in my medical decision making (see  chart for details).  Review of the [place] CSRS was performed in accordance of the [place] prior to dispensing any controlled drugs.     Assessment and plan Acute cystitis Patient presents to the emergency department with dysuria and increased urinary frequency for 1 hour.  Patient reports that her symptoms feel consistent with a prior episodes of cystitis.  Differential diagnosis originally included cystitis and pyelonephritis.  History and physical exam findings are most consistent with cystitis at this time.  Patient was discharged with Keflex and advised to follow-up with primary care as needed.  All patient questions were answered.    ____________________________________________  FINAL CLINICAL IMPRESSION(S) / ED DIAGNOSES  Final diagnoses:  Cystitis      NEW MEDICATIONS STARTED DURING THIS VISIT:  ED Discharge Orders        Ordered    cephALEXin (KEFLEX) 500 MG capsule  4 times daily     06/26/17 1831          This chart was dictated using voice recognition software/Dragon. Despite best efforts to proofread, errors can occur which can change the meaning. Any change was purely unintentional.    Karren Cobble 06/26/17 1915    Carrie Mew, MD 06/26/17 2342

## 2017-06-26 NOTE — ED Notes (Signed)
Pt ambulatory upon discharge. Verbalized understanding of discharge instructions, follow-up care and prescription. VSS. Skin warm and dry. A&O x4.  

## 2017-07-07 ENCOUNTER — Encounter: Payer: Self-pay | Admitting: Urology

## 2017-07-07 ENCOUNTER — Ambulatory Visit: Payer: Medicare Other | Admitting: Urology

## 2017-07-07 VITALS — BP 120/72 | HR 76 | Resp 16 | Ht 60.0 in | Wt 101.4 lb

## 2017-07-07 DIAGNOSIS — N390 Urinary tract infection, site not specified: Secondary | ICD-10-CM

## 2017-07-07 LAB — URINALYSIS, COMPLETE
Bilirubin, UA: NEGATIVE
GLUCOSE, UA: NEGATIVE
Ketones, UA: NEGATIVE
Leukocytes, UA: NEGATIVE
NITRITE UA: NEGATIVE
Protein, UA: NEGATIVE
RBC, UA: NEGATIVE
Specific Gravity, UA: 1.01 (ref 1.005–1.030)
UUROB: 0.2 mg/dL (ref 0.2–1.0)
pH, UA: 6 (ref 5.0–7.5)

## 2017-07-07 LAB — MICROSCOPIC EXAMINATION
RBC MICROSCOPIC, UA: NONE SEEN /HPF (ref 0–2)
WBC, UA: NONE SEEN /hpf (ref 0–5)

## 2017-07-07 LAB — BLADDER SCAN AMB NON-IMAGING: Scan Result: 13

## 2017-07-07 NOTE — Progress Notes (Signed)
07/07/2017 9:36 AM   Niger Beidler [birth year] [id number]  Referring provider: Tracie Harrier, MD [address] [facility] [place], [place] [address]  Chief Complaint  Patient presents with  . Follow-up    HPI: 84 year old female presents for follow-up of a recent emergency department visit.  She is followed for recurrent UTI and on 4/6 had acute onset of dysuria without fever or chills.  Her urinalysis showed TNTC WBC and RBC.  She was treated with Keflex 500 mg 4 times daily.  A urine culture was not ordered.  She is feeling better but is not convinced her infection has resolved.  She does have overactive bladder symptoms and takes oxybutynin as needed.  She feels this medication worked better than [employer].   PMH: Past Medical History:  Diagnosis Date  . Arrhythmia   . Chronic kidney disease    UTI  . GERD (gastroesophageal reflux disease)   . Glaucoma (increased eye pressure)   . Hypercholesterolemia   . Hypertension     Surgical History: Past Surgical History:  Procedure Laterality Date  . ABDOMINAL HYSTERECTOMY    . BREAST BIOPSY Right 06/11/2015   Procedure: BREAST BIOPSY WITH NEEDLE LOCALIZATION;  Surgeon: Leonie Green, MD;  Location: [facility] ORS;  Service: General;  Laterality: Right;  . BREAST EXCISIONAL BIOPSY Right 06/11/2015   NEG  . BREAST SURGERY Right    Breast Needle Biopsy  . CHOLECYSTECTOMY    . DILATION AND CURETTAGE OF UTERUS    . EYE SURGERY Bilateral    Cataract Extraction with IOL  . LAPAROSCOPIC OOPHORECTOMY      Home Medications:  Allergies as of 07/07/2017      Reactions   Nitrofurantoin Hives   Sulfa Antibiotics Rash      Medication List        Accurate as of 07/07/17  9:36 AM. Always use your most recent med list.          aspirin EC 81 MG tablet Take 81 mg by mouth daily.   atorvastatin 20 MG tablet Commonly known as:  LIPITOR Take 20 mg by mouth daily at 6 PM.   cetirizine 10 MG tablet Commonly  known as:  ZYRTEC Take 10 mg by mouth daily.   cholecalciferol 1000 units tablet Commonly known as:  VITAMIN D Take 1,000 Units by mouth daily.   docusate sodium 100 MG capsule Commonly known as:  COLACE Take 100 mg by mouth at bedtime.   estradiol 0.1 MG/GM vaginal cream Commonly known as:  ESTRACE VAGINAL Apply 0.5mg  (pea-sized amount)  just inside the vaginal introitus with a finger-tip every night for two weeks and then Monday, Wednesday and Friday nights.   losartan 50 MG tablet Commonly known as:  COZAAR Take 50 mg by mouth daily.   omeprazole 20 MG capsule Commonly known as:  PRILOSEC Take 20 mg by mouth daily.   oxybutynin 5 MG tablet Commonly known as:  DITROPAN Take 1 tablet (5 mg total) 3 (three) times daily as needed by mouth for bladder spasms.   PROBIOTIC FORMULA PO Take 1 capsule by mouth daily.   Trospium Chloride 60 MG Cp24 Take by mouth.   zolpidem 10 MG tablet Commonly known as:  AMBIEN Take 10 mg by mouth at bedtime as needed for sleep.       Allergies:  Allergies  Allergen Reactions  . Nitrofurantoin Hives  . Sulfa Antibiotics Rash    Family History: Family History  Problem Relation Age of Onset  .  Breast cancer Mother 78       early 23's  . Kidney cancer Neg Hx   . Kidney disease Neg Hx   . Prostate cancer Neg Hx   . Bladder Cancer Neg Hx     Social History:  reports that she has never smoked. She has never used smokeless tobacco. She reports that she drinks about 0.6 oz of alcohol per week. She reports that she does not use drugs.  ROS: UROLOGY Frequent Urination?: Yes Hard to postpone urination?: Yes Burning/pain with urination?: No Get up at night to urinate?: Yes Leakage of urine?: Yes Urine stream starts and stops?: Yes Trouble starting stream?: No Do you have to strain to urinate?: Yes Blood in urine?: No Urinary tract infection?: Yes Sexually transmitted disease?: No Injury to kidneys or bladder?: No Painful  intercourse?: No Weak stream?: Yes Currently pregnant?: No Vaginal bleeding?: No  Gastrointestinal Nausea?: No Vomiting?: No Indigestion/heartburn?: No Diarrhea?: No Constipation?: No  Constitutional Fever: No Night sweats?: Yes Weight loss?: No Fatigue?: No  Skin Skin rash/lesions?: No Itching?: No  Eyes Blurred vision?: Yes Double vision?: No  Ears/Nose/Throat Sore throat?: No Sinus problems?: Yes  Hematologic/Lymphatic Swollen glands?: No Easy bruising?: No  Cardiovascular Leg swelling?: No Chest pain?: No  Respiratory Cough?: No Shortness of breath?: No  Endocrine Excessive thirst?: No  Musculoskeletal Back pain?: No Joint pain?: No  Neurological Headaches?: No Dizziness?: No  Psychologic Depression?: No Anxiety?: No  Physical Exam: BP 120/72   Pulse 76   Resp 16   Ht 5' (1.524 m)   Wt 101 lb 6.4 oz (46 kg)   SpO2 98%   BMI 19.80 kg/m   Constitutional:  Alert and oriented, No acute distress. HEENT: [place] AT, moist mucus membranes.  Trachea midline, no masses. Cardiovascular: No clubbing, cyanosis, or edema. Respiratory: Normal respiratory effort, no increased work of breathing. Skin: No rashes, bruises or suspicious lesions. Neurologic: Grossly intact, no focal deficits, moving all 4 extremities. Psychiatric: Normal mood and affect.  Laboratory Data:  Urinalysis Dipstick and microscopy negative   Assessment & Plan:   Urinalysis today is clear.  PVR by bladder scan was 13 mL.  She was instructed to call for recurrent or worsening symptoms and will otherwise make a 2-month follow-up visit.    Return in about 4 months (around 11/06/2017).  Abbie Sons, [place] [address] [place], [place] [address] [phone]

## 2017-07-07 NOTE — Addendum Note (Signed)
Addended by: Garnette Gunner on: 07/07/2017 10:10 AM   Modules accepted: Orders

## 2017-07-09 ENCOUNTER — Ambulatory Visit
Admission: RE | Admit: 2017-07-09 | Discharge: 2017-07-09 | Disposition: A | Discharge status: Home / Self Care | Payer: Medicare Other | Source: Ambulatory Visit | Attending: Internal Medicine | Admitting: Internal Medicine

## 2017-07-09 DIAGNOSIS — Z1231 Encounter for screening mammogram for malignant neoplasm of breast: Secondary | ICD-10-CM | POA: Diagnosis not present

## 2017-08-05 ENCOUNTER — Telehealth: Payer: Self-pay | Admitting: Urology

## 2017-08-05 NOTE — Telephone Encounter (Signed)
Patient is calling and asking for a refill on her oxybutynin 5mg  called into her pharmacy [facility] [address]   Sharyn Lull

## 2017-08-06 MED ORDER — OXYBUTYNIN CHLORIDE 5 MG PO TABS: 5.0000 mg | ORAL_TABLET | Freq: Three times a day (TID) | ORAL | 1 refills | Status: DC | PRN

## 2017-08-06 NOTE — Telephone Encounter (Signed)
Called and lm that her script had been called in   [place]

## 2017-08-06 NOTE — Telephone Encounter (Signed)
rx sent

## 2017-08-21 IMAGING — MG US  BREAST BX W/ LOC DEV 1ST LESION IMG BX SPEC US GUIDE*R*
1 series · 8 of 8 positions shown · non-contrast
Comparison: Previous exam(s).

ADDENDUM:
PATHOLOGY ADDENDUM:

Pathology: Intraductal papilloma. Negative for atypia and
malignancy.
Pathology concordant with imaging findings: Yes
Recommendation: Surgical consultation is recommended. The patient
will follow-up with Dr. Desousa.
At the request of the patient, I spoke with her by telephone on
05/13/2015 at [DATE] a.m.
CLINICAL DATA: 82-year-old female for ultrasound-guided biopsy of a
right intraductal mass versus debris
EXAM:
ULTRASOUND GUIDED RIGHT BREAST CORE NEEDLE BIOPSY

[Series 1: MG view · 0.03mm/px · 8 of 13 slices shown]
[im 1/13]
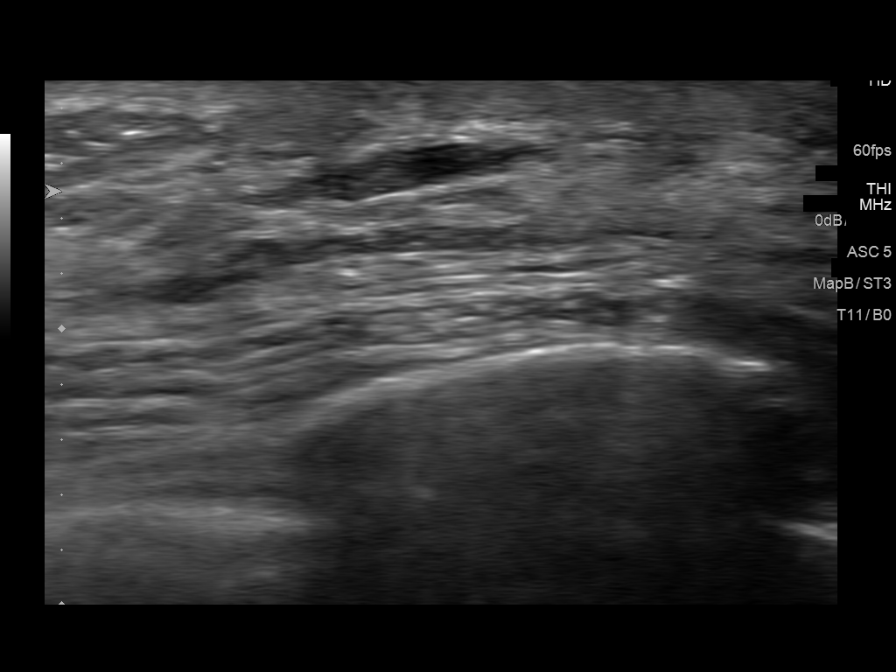
[im 2/13]
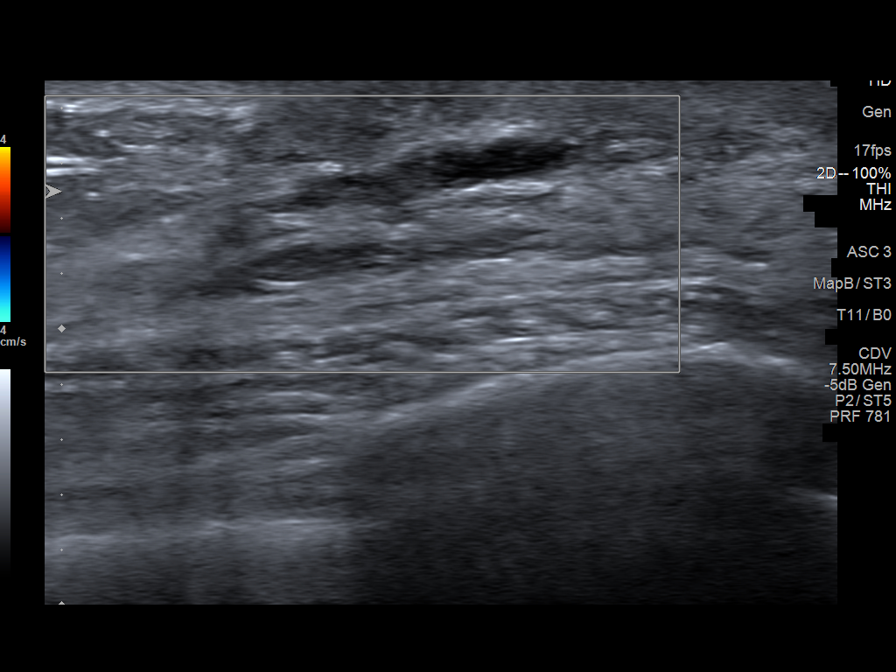
[im 4/13]
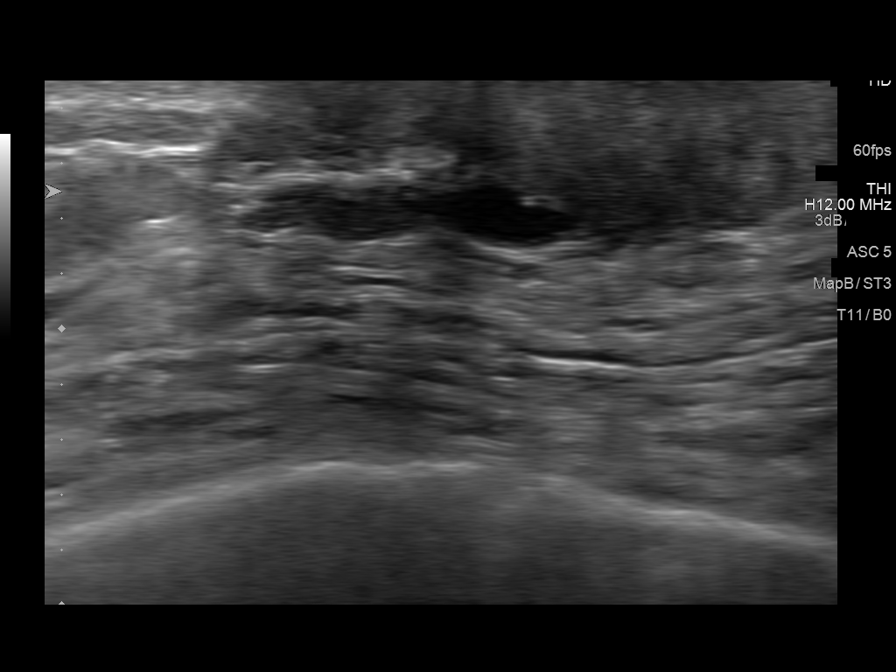
[im 6/13]
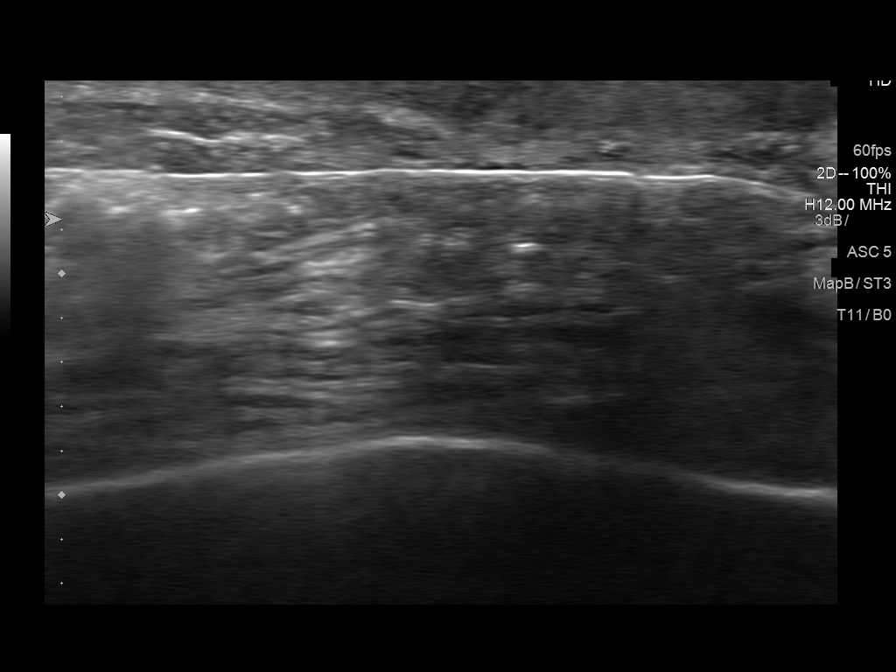
[im 7/13]
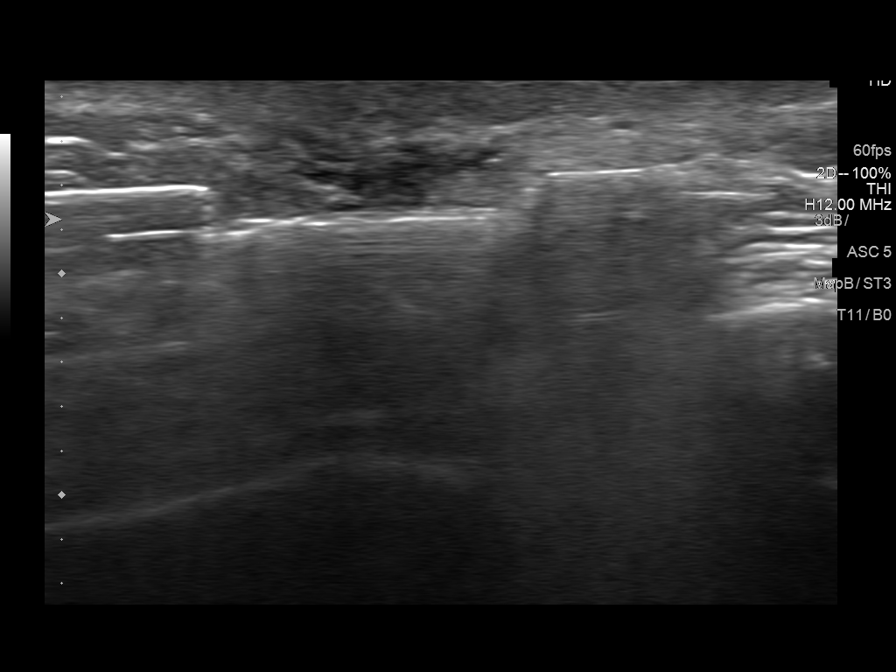
[im 9/13]
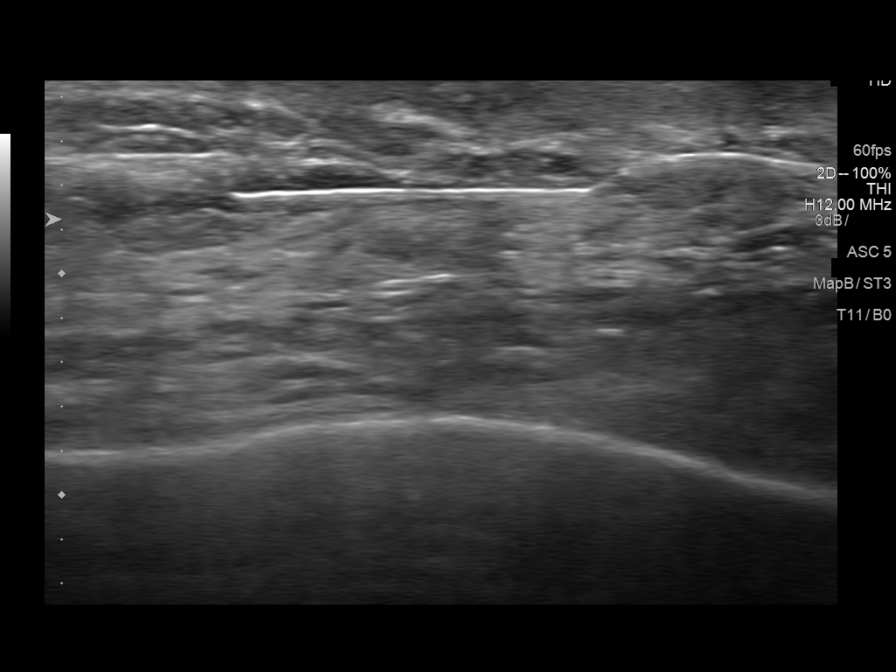
[im 11/13]
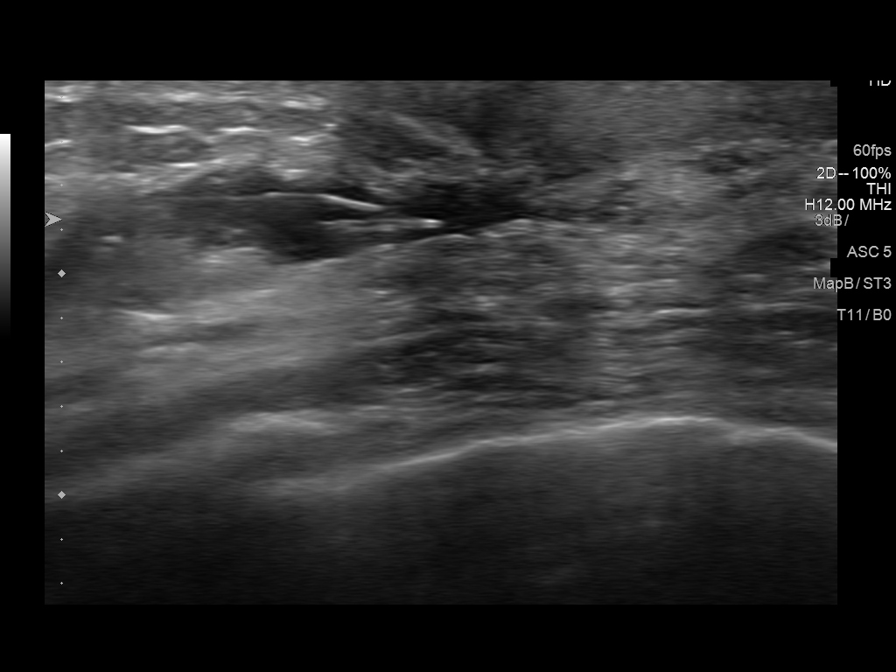
[im 13/13]
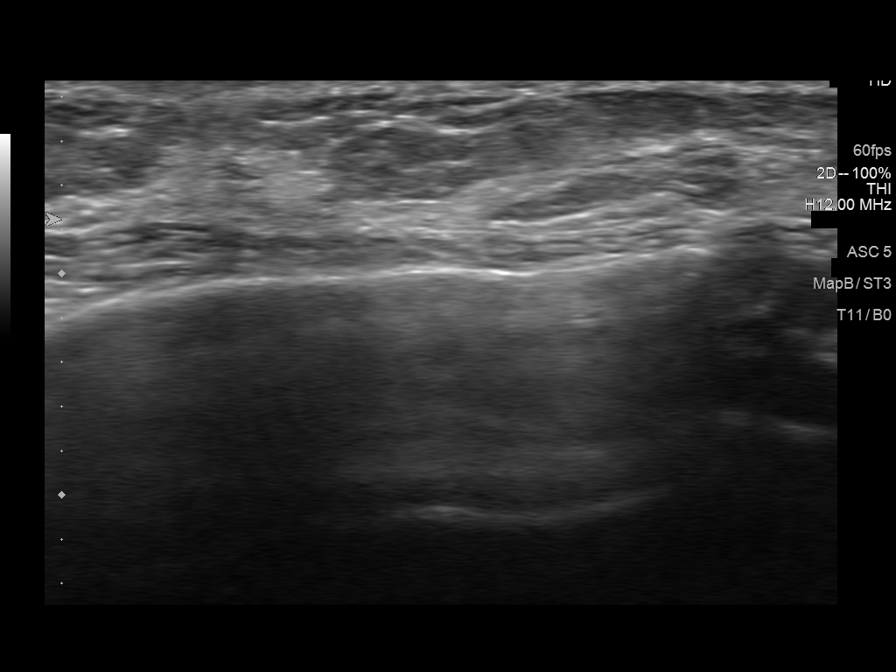

[8 of 8 positions shown; findings below may reference images not displayed]

PROCEDURE:
I met with the patient and we discussed the procedure of
ultrasound-guided biopsy, including benefits and alternatives. We
discussed the high likelihood of a successful procedure. We
discussed the risks of the procedure including infection, bleeding,
tissue injury, clip migration, and inadequate sampling. Informed
written consent was given. The usual time-out protocol was performed
immediately prior to the procedure.

Using sterile technique and 1% Lidocaine as local anesthetic, under
direct ultrasound visualization, a 12 gauge vacuum-assisted device
was used to perform biopsy of an intraductal mass versus debris in
the subareolar region of the right breast using a lateral to medial
approach. At the conclusion of the procedure, a coil shaped tissue
marker clip was deployed into the biopsy cavity. Follow-up 2-view
mammogram was performed and dictated separately.
IMPRESSION: Ultrasound-guided biopsy of an intraductal mass versus debris within
the subareolar right breast. No apparent complications.

## 2017-10-04 ENCOUNTER — Other Ambulatory Visit: Payer: Self-pay

## 2017-10-04 MED ORDER — OXYBUTYNIN CHLORIDE 5 MG PO TABS: 5.0000 mg | ORAL_TABLET | Freq: Three times a day (TID) | ORAL | 1 refills | Status: DC | PRN

## 2017-10-27 ENCOUNTER — Ambulatory Visit: Payer: Medicare Other

## 2017-10-27 ENCOUNTER — Other Ambulatory Visit: Payer: Self-pay

## 2017-10-27 DIAGNOSIS — N390 Urinary tract infection, site not specified: Secondary | ICD-10-CM

## 2017-10-27 LAB — URINALYSIS, COMPLETE
BILIRUBIN UA: NEGATIVE
GLUCOSE, UA: NEGATIVE
KETONES UA: NEGATIVE
Nitrite, UA: POSITIVE — AB
SPEC GRAV UA: 1.02 (ref 1.005–1.030)
Urobilinogen, Ur: 0.2 mg/dL (ref 0.2–1.0)
pH, UA: 6 (ref 5.0–7.5)

## 2017-10-27 LAB — MICROSCOPIC EXAMINATION: EPITHELIAL CELLS (NON RENAL): NONE SEEN /HPF (ref 0–10)

## 2017-10-27 MED ORDER — CEFUROXIME AXETIL 250 MG PO TABS
250.0000 mg | ORAL_TABLET | Freq: Two times a day (BID) | ORAL | Status: AC
Start: 2017-10-27 — End: 2017-11-03

## 2017-10-27 NOTE — Progress Notes (Signed)
Pt is present in office for a nurse visit. She states over the weekend she was seen at [facility]. She states they started her on Cipro, which she has been taking for 4 days. She states she is not feeling any better. A culture was not performed by the urgent care. To obtain a non-contaminated specimen, pt was catheterized.   In and Out Catheterization  Patient is present today for a I & O catheterization due to possible uti. Patient was cleaned and prepped in a sterile fashion with betadine and Lidocaine 2% jelly was instilled into the urethra.  A 14FR cath was inserted no complications were noted , 70ml of urine return was noted, urine was yellow in color. A clean urine sample was collected for analysis and culture. Bladder was drained  And catheter was removed with out difficulty.    Preformed by: Cristie Hem, CMA and Darrick Grinder, CMA  Pt positive for infection, d/c Cipro, started on Ceftin 250, bid, 7 days. Wait for culture.

## 2017-10-30 LAB — CULTURE, URINE COMPREHENSIVE

## 2017-11-01 ENCOUNTER — Telehealth: Payer: Self-pay

## 2017-11-01 NOTE — Telephone Encounter (Signed)
Left pt mess to call 

## 2017-11-01 NOTE — Telephone Encounter (Signed)
-----   Message from Abbie Sons, MD sent at 10/31/2017 10:24 AM EDT ----- Urine culture was resistant to Cipro but sensitive to cefuroxime

## 2017-11-09 ENCOUNTER — Telehealth: Payer: Self-pay | Admitting: Radiology

## 2017-11-09 NOTE — Telephone Encounter (Signed)
Notes recorded by Abbie Sons, MD on 10/31/2017 at 10:24 AM EDT Urine culture was resistant to Cipro but sensitive to cefuroxime.  Patient states she did not receive this message but states she wants to cancel the appointment on 11/10/2017 because she continues to have a UTI & understood that she may have a urethral dilation at this appointment. Appointment rescheduled to 12/08/2017. Should patient repeat urine culture?

## 2017-11-10 ENCOUNTER — Ambulatory Visit: Payer: Medicare Other | Admitting: Urology

## 2017-11-10 NOTE — Telephone Encounter (Signed)
Appointment made for cathed urine culture. Patient aware.

## 2017-11-10 NOTE — Telephone Encounter (Signed)
If she never took the cefuroxime would recommend repeating the culture.

## 2017-11-11 ENCOUNTER — Other Ambulatory Visit: Payer: Self-pay

## 2017-11-11 ENCOUNTER — Ambulatory Visit: Payer: Medicare Other

## 2017-11-11 ENCOUNTER — Telehealth: Payer: Self-pay | Admitting: Urology

## 2017-11-11 DIAGNOSIS — N390 Urinary tract infection, site not specified: Secondary | ICD-10-CM

## 2017-11-11 LAB — URINALYSIS, COMPLETE
Bilirubin, UA: NEGATIVE
Glucose, UA: NEGATIVE
KETONES UA: NEGATIVE
Leukocytes, UA: NEGATIVE
NITRITE UA: NEGATIVE
PH UA: 6 (ref 5.0–7.5)
Protein, UA: NEGATIVE
RBC, UA: NEGATIVE
Specific Gravity, UA: 1.01 (ref 1.005–1.030)
Urobilinogen, Ur: 0.2 mg/dL (ref 0.2–1.0)

## 2017-11-11 LAB — MICROSCOPIC EXAMINATION
Epithelial Cells (non renal): NONE SEEN /hpf (ref 0–10)
WBC, UA: NONE SEEN /hpf (ref 0–5)

## 2017-11-11 NOTE — Telephone Encounter (Signed)
Pt notified on vmail that UA today was clear , will send for culture and call with results. Will not start on any abx at this time

## 2017-11-11 NOTE — Telephone Encounter (Signed)
Patient called stating that we were supposed to have called her back today with her UA results? She just did it today?   Felicia Hughes

## 2017-11-14 LAB — CULTURE, URINE COMPREHENSIVE

## 2017-11-15 ENCOUNTER — Telehealth: Payer: Self-pay | Admitting: Family Medicine

## 2017-11-15 NOTE — Telephone Encounter (Signed)
Patient notified

## 2017-11-15 NOTE — Telephone Encounter (Signed)
-----   Message from Abbie Sons, MD sent at 11/14/2017  9:59 AM EDT ----- Urine culture was negative for infection

## 2017-12-07 ENCOUNTER — Ambulatory Visit: Payer: Medicare Other

## 2017-12-07 DIAGNOSIS — N390 Urinary tract infection, site not specified: Secondary | ICD-10-CM

## 2017-12-07 LAB — URINALYSIS, COMPLETE
BILIRUBIN UA: NEGATIVE
GLUCOSE, UA: NEGATIVE
KETONES UA: NEGATIVE
Nitrite, UA: POSITIVE — AB
PROTEIN UA: NEGATIVE
RBC, UA: NEGATIVE
SPEC GRAV UA: 1.015 (ref 1.005–1.030)
Urobilinogen, Ur: 0.2 mg/dL (ref 0.2–1.0)
pH, UA: 6.5 (ref 5.0–7.5)

## 2017-12-07 LAB — MICROSCOPIC EXAMINATION: EPITHELIAL CELLS (NON RENAL): NONE SEEN /HPF (ref 0–10)

## 2017-12-07 MED ORDER — CEPHALEXIN 500 MG PO CAPS: 500.0000 mg | ORAL_CAPSULE | Freq: Four times a day (QID) | ORAL | 0 refills | Status: DC

## 2017-12-07 NOTE — Progress Notes (Signed)
In and Out Catheterization  Patient is present today for a I & O catheterization due to possible UTI. Patient complaining of urinary urgency, dysuria, and bladder pain Patient was cleaned and prepped in a sterile fashion with betadine and Lidocaine 2% jelly was instilled into the urethra.  A 14FR cath was inserted no complications were noted , 173ml of urine return was noted, urine was yellow in color. A clean urine sample was collected for UA and culture. Bladder was drained  And catheter was removed with out difficulty.    Preformed by: Fonnie Jarvis, CMA  Follow up/ Additional notes: Per Dr. Erlene Quan urine today is frankly positive for infection script for Keflex 500mg  was sent to pharm and will call with urine culture results

## 2017-12-08 ENCOUNTER — Telehealth: Payer: Self-pay | Admitting: Urology

## 2017-12-08 ENCOUNTER — Ambulatory Visit: Payer: Medicare Other | Admitting: Urology

## 2017-12-08 NOTE — Progress Notes (Signed)
Patient notified of urinalysis frankly positive for infection & script sent to pharmacy. Advised patient that urine culture results will not be available for at least 3 days & she will be notified if antibiotics need to be changed pending sensitivities. Questions answered. Patient voices understanding.

## 2017-12-08 NOTE — Telephone Encounter (Signed)
Pt needs a refill for Oxybutynin sent to [facility] [address].

## 2017-12-10 MED ORDER — OXYBUTYNIN CHLORIDE 5 MG PO TABS: 5.0000 mg | ORAL_TABLET | Freq: Three times a day (TID) | ORAL | 1 refills | Status: DC | PRN

## 2017-12-10 NOTE — Addendum Note (Signed)
Addended by: Garnette Gunner on: 12/10/2017 02:08 PM   Modules accepted: Orders

## 2017-12-12 LAB — CULTURE, URINE COMPREHENSIVE

## 2017-12-13 ENCOUNTER — Telehealth: Payer: Self-pay

## 2017-12-13 NOTE — Telephone Encounter (Signed)
-----   Message from Abbie Sons, MD sent at 12/12/2017 11:42 AM EDT ----- Urine culture positive and sensitive to prescribed antibiotic

## 2017-12-13 NOTE — Telephone Encounter (Signed)
Patient notified

## 2017-12-21 ENCOUNTER — Encounter: Payer: Self-pay | Admitting: Urology

## 2017-12-21 ENCOUNTER — Ambulatory Visit: Payer: Medicare Other | Admitting: Urology

## 2017-12-21 VITALS — BP 112/72 | HR 98 | Ht 60.0 in | Wt 97.5 lb

## 2017-12-21 DIAGNOSIS — R339 Retention of urine, unspecified: Secondary | ICD-10-CM

## 2017-12-21 LAB — URINALYSIS, COMPLETE
Bilirubin, UA: NEGATIVE
GLUCOSE, UA: NEGATIVE
KETONES UA: NEGATIVE
Leukocytes, UA: NEGATIVE
NITRITE UA: NEGATIVE
PROTEIN UA: NEGATIVE
RBC UA: NEGATIVE
SPEC GRAV UA: 1.015 (ref 1.005–1.030)
Urobilinogen, Ur: 0.2 mg/dL (ref 0.2–1.0)
pH, UA: 6 (ref 5.0–7.5)

## 2017-12-21 LAB — BLADDER SCAN AMB NON-IMAGING: SCAN RESULT: 109

## 2017-12-21 MED ORDER — TAMSULOSIN HCL 0.4 MG PO CAPS: 0.4000 mg | ORAL_CAPSULE | Freq: Every day | ORAL | 3 refills | Status: DC

## 2017-12-21 NOTE — Progress Notes (Signed)
12/21/2017 4:54 PM   Felicia Hughes 22-Sep-1933 [id number]  Referring provider: Tracie Harrier, [facility] [place], [place] [address]  No chief complaint on file.   HPI: Patient is a 84 year old Caucasian female who presents today with the inability to void.  She states that yesterday evening she started to have just a little bit of urinary trickling and then she only got up one time last night which is unusual for her and has not been able to have any urination since that one void last evening.  She has avoided any fluids today as she is fearful she will undergo painful retention.    She states that she is been having frequency, urgency, nocturia, incontinence, trouble starting her stream, straining to urinate and a weak urinary stream.  She is also been suffering from constipation.  She has not taken any over-the-counter cold medication or allergy medications.   She is currently on oxybutynin 5 mg daily.    Patient denies any gross hematuria, dysuria or suprapubic/flank pain.  Patient denies any fevers, chills, nausea or vomiting.   Her cath UA is negative.  Her PVR is 109 mL.    PMH: Past Medical History:  Diagnosis Date  . Arrhythmia   . Chronic kidney disease    UTI  . GERD (gastroesophageal reflux disease)   . Glaucoma (increased eye pressure)   . Hypercholesterolemia   . Hypertension     Surgical History: Past Surgical History:  Procedure Laterality Date  . ABDOMINAL HYSTERECTOMY    . BREAST BIOPSY Right 06/11/2015   Procedure: BREAST BIOPSY WITH NEEDLE LOCALIZATION;  Surgeon: Leonie Green, MD;  Location: [facility] ORS;  Service: General;  Laterality: Right;  . BREAST EXCISIONAL BIOPSY Right 06/11/2015   NEG  . BREAST SURGERY Right    Breast Needle Biopsy  . CHOLECYSTECTOMY    . DILATION AND CURETTAGE OF UTERUS    . EYE SURGERY Bilateral    Cataract Extraction with IOL  . LAPAROSCOPIC OOPHORECTOMY      Home Medications:    Allergies as of 12/21/2017      Reactions   Nitrofurantoin Hives   Sulfa Antibiotics Rash      Medication List        Accurate as of 12/21/17  4:54 PM. Always use your most recent med list.          aspirin EC 81 MG tablet Take 81 mg by mouth daily.   atorvastatin 20 MG tablet Commonly known as:  LIPITOR Take 20 mg by mouth daily at 6 PM.   CENTRUM SILVER 50+WOMEN Tabs Take by mouth.   cephALEXin 500 MG capsule Commonly known as:  KEFLEX Take 1 capsule (500 mg total) by mouth 4 (four) times daily.   cetirizine 10 MG tablet Commonly known as:  ZYRTEC Take 10 mg by mouth daily.   cholecalciferol 1000 units tablet Commonly known as:  VITAMIN D Take 1,000 Units by mouth daily.   docusate sodium 100 MG capsule Commonly known as:  COLACE Take 100 mg by mouth at bedtime.   estradiol 0.1 MG/GM vaginal cream Commonly known as:  ESTRACE Apply 0.5mg  (pea-sized amount)  just inside the vaginal introitus with a finger-tip every night for two weeks and then Monday, Wednesday and Friday nights.   losartan 50 MG tablet Commonly known as:  COZAAR Take 50 mg by mouth daily.   omeprazole 20 MG capsule Commonly known as:  PRILOSEC Take 20 mg by  mouth daily.   oxybutynin 5 MG tablet Commonly known as:  DITROPAN Take 1 tablet (5 mg total) by mouth 3 (three) times daily as needed for bladder spasms.   PROBIOTIC FORMULA PO Take 1 capsule by mouth daily.   tamsulosin 0.4 MG Caps capsule Commonly known as:  FLOMAX Take 1 capsule (0.4 mg total) by mouth daily.   Trospium Chloride 60 MG Cp24 Take by mouth.   zolpidem 10 MG tablet Commonly known as:  AMBIEN Take 10 mg by mouth at bedtime as needed for sleep.       Allergies:  Allergies  Allergen Reactions  . Nitrofurantoin Hives  . Sulfa Antibiotics Rash    Family History: Family History  Problem Relation Age of Onset  . Breast cancer Mother 61       early 80's  . Kidney cancer Neg Hx   . Kidney disease Neg Hx    . Prostate cancer Neg Hx   . Bladder Cancer Neg Hx     Social History:  reports that she has never smoked. She has never used smokeless tobacco. She reports that she drinks about 1.0 standard drinks of alcohol per week. She reports that she does not use drugs.  ROS: UROLOGY Frequent Urination?: Yes Hard to postpone urination?: Yes Burning/pain with urination?: No Get up at night to urinate?: Yes Leakage of urine?: Yes Urine stream starts and stops?: No Trouble starting stream?: Yes Do you have to strain to urinate?: Yes Blood in urine?: No Urinary tract infection?: Yes Sexually transmitted disease?: No Injury to kidneys or bladder?: No Painful intercourse?: No Weak stream?: Yes Currently pregnant?: No Vaginal bleeding?: No Last menstrual period?: n  Gastrointestinal Nausea?: No Vomiting?: No Indigestion/heartburn?: No Diarrhea?: No Constipation?: Yes  Constitutional Fever: No Night sweats?: No Weight loss?: Yes Fatigue?: No  Skin Skin rash/lesions?: No Itching?: No  Eyes Blurred vision?: No Double vision?: No  Ears/Nose/Throat Sore throat?: No Sinus problems?: No  Hematologic/Lymphatic Easy bruising?: Yes  Cardiovascular Leg swelling?: No Chest pain?: No  Respiratory Cough?: No Shortness of breath?: No  Endocrine Excessive thirst?: No  Musculoskeletal Back pain?: No Joint pain?: No  Neurological Headaches?: No Dizziness?: No  Psychologic Depression?: No Anxiety?: Yes  Physical Exam: BP 112/72 (BP Location: Left Arm, Patient Position: Sitting, Cuff Size: Normal)   Pulse 98   Ht 5' (1.524 m)   Wt 97 lb 8 oz (44.2 kg)   BMI 19.04 kg/m   Constitutional: Well nourished. Alert and oriented, No acute distress. HEENT: [place] AT, moist mucus membranes. Trachea midline, no masses. Cardiovascular: No clubbing, cyanosis, or edema. Respiratory: Normal respiratory effort, no increased work of breathing. Skin: No rashes, bruises or suspicious  lesions. Lymph: No cervical or inguinal adenopathy. Neurologic: Grossly intact, no focal deficits, moving all 4 extremities. Psychiatric: Normal mood and affect.   Laboratory Data: Urinalysis CATH UA is negative.  See Epic.    Assessment & Plan:    1. Urinary retention Advised patient to discontinue the oxybutynin at this time I have sent a prescription for tamsulosin 0.4 mg to the pharmacy She will return tomorrow for urethral dilation and symptom recheck Urine is sent for culture to rule out any subclinical infection  Return for tomorrow for dilation.  Zara Council, PA-C  [facility] [address] [place], [place] [address] [phone]

## 2017-12-21 NOTE — Progress Notes (Signed)
In and Out Catheterization  Patient is present today for a I & O catheterization due to urinary incontinence. Patient was cleaned and prepped in a sterile fashion with betadine and Lidocaine 2% jelly was instilled into the urethra.  A 14FR cath was inserted no complications were noted , 165ml of urine return was noted, urine was yellow in color. A clean urine sample was collected for UA and Urine Culture. Bladder was drained  And catheter was removed with out difficulty.    Preformed by: KDM cma

## 2017-12-22 ENCOUNTER — Encounter: Payer: Self-pay | Admitting: Urology

## 2017-12-22 ENCOUNTER — Ambulatory Visit (INDEPENDENT_AMBULATORY_CARE_PROVIDER_SITE_OTHER): Payer: Medicare Other | Admitting: Urology

## 2017-12-22 VITALS — BP 104/64 | HR 84 | Ht 60.0 in | Wt 98.6 lb

## 2017-12-22 DIAGNOSIS — R339 Retention of urine, unspecified: Secondary | ICD-10-CM | POA: Diagnosis not present

## 2017-12-22 NOTE — Progress Notes (Signed)
12/22/2017 10:44 AM   Felicia Hughes 18-Jan-1934 [id number]  Referring provider: Tracie Harrier, [facility] [place], [place] [address]  No chief complaint on file.   HPI: Patient 84 year old Caucasian female who presents today for urethral dilation.  Background history Patient is a 84 year old Caucasian female who presents today with the inability to void.  She states that yesterday evening she started to have just a little bit of urinary trickling and then she only got up one time last night which is unusual for her and has not been able to have any urination since that one void last evening.  She has avoided any fluids today as she is fearful she will undergo painful retention.  She states that she is been having frequency, urgency, nocturia, incontinence, trouble starting her stream, straining to urinate and a weak urinary stream.  She is also been suffering from constipation.  She has not taken any over-the-counter cold medication or allergy medications.   She is currently on oxybutynin 5 mg daily.  Patient denies any gross hematuria, dysuria or suprapubic/flank pain.  Patient denies any fevers, chills, nausea or vomiting.  Her cath UA is negative.  Her PVR is 109 mL.    Patient was instructed to stop her oxybutynin yesterday and was started on tamsulosin 0.4 mg daily.  Her urine is still pending for culture.  She states that she has been urinating without difficulty since last night.  Patient denies any gross hematuria, dysuria or suprapubic/flank pain.  Patient denies any fevers, chills, nausea or vomiting.   PMH: Past Medical History:  Diagnosis Date  . Arrhythmia   . Chronic kidney disease    UTI  . GERD (gastroesophageal reflux disease)   . Glaucoma (increased eye pressure)   . Hypercholesterolemia   . Hypertension     Surgical History: Past Surgical History:  Procedure Laterality Date  . ABDOMINAL HYSTERECTOMY    . BREAST BIOPSY Right  06/11/2015   Procedure: BREAST BIOPSY WITH NEEDLE LOCALIZATION;  Surgeon: Leonie Green, MD;  Location: [facility] ORS;  Service: General;  Laterality: Right;  . BREAST EXCISIONAL BIOPSY Right 06/11/2015   NEG  . BREAST SURGERY Right    Breast Needle Biopsy  . CHOLECYSTECTOMY    . DILATION AND CURETTAGE OF UTERUS    . EYE SURGERY Bilateral    Cataract Extraction with IOL  . LAPAROSCOPIC OOPHORECTOMY      Home Medications:  Allergies as of 12/22/2017      Reactions   Nitrofurantoin Hives   Sulfa Antibiotics Rash      Medication List        Accurate as of 12/22/17 10:44 AM. Always use your most recent med list.          aspirin EC 81 MG tablet Take 81 mg by mouth daily.   atorvastatin 20 MG tablet Commonly known as:  LIPITOR Take 20 mg by mouth daily at 6 PM.   CENTRUM SILVER 50+WOMEN Tabs Take by mouth.   cephALEXin 500 MG capsule Commonly known as:  KEFLEX Take 1 capsule (500 mg total) by mouth 4 (four) times daily.   cholecalciferol 1000 units tablet Commonly known as:  VITAMIN D Take 1,000 Units by mouth daily.   estradiol 0.1 MG/GM vaginal cream Commonly known as:  ESTRACE Apply 0.5mg  (pea-sized amount)  just inside the vaginal introitus with a finger-tip every night for two weeks and then Monday, Wednesday and Friday nights.   losartan 50  MG tablet Commonly known as:  COZAAR Take 50 mg by mouth daily.   oxybutynin 5 MG tablet Commonly known as:  DITROPAN Take 1 tablet (5 mg total) by mouth 3 (three) times daily as needed for bladder spasms.   PROBIOTIC FORMULA PO Take 1 capsule by mouth daily.   tamsulosin 0.4 MG Caps capsule Commonly known as:  FLOMAX Take 1 capsule (0.4 mg total) by mouth daily.   zolpidem 10 MG tablet Commonly known as:  AMBIEN Take 10 mg by mouth at bedtime as needed for sleep.       Allergies:  Allergies  Allergen Reactions  . Nitrofurantoin Hives  . Sulfa Antibiotics Rash    Family History: Family History  Problem  Relation Age of Onset  . Breast cancer Mother 25       early 55's  . Kidney cancer Neg Hx   . Kidney disease Neg Hx   . Prostate cancer Neg Hx   . Bladder Cancer Neg Hx     Social History:  reports that she has never smoked. She has never used smokeless tobacco. She reports that she drinks about 1.0 standard drinks of alcohol per week. She reports that she does not use drugs.  ROS: UROLOGY Frequent Urination?: No Hard to postpone urination?: No Burning/pain with urination?: No Get up at night to urinate?: Yes Leakage of urine?: Yes Urine stream starts and stops?: No Trouble starting stream?: No Do you have to strain to urinate?: No Blood in urine?: No Urinary tract infection?: No Sexually transmitted disease?: No Injury to kidneys or bladder?: No Painful intercourse?: No Weak stream?: No Currently pregnant?: No Vaginal bleeding?: No Last menstrual period?: n  Gastrointestinal Nausea?: No Vomiting?: No Indigestion/heartburn?: No Diarrhea?: No Constipation?: No  Constitutional Fever: No Night sweats?: No Weight loss?: No Fatigue?: No  Skin Skin rash/lesions?: No Itching?: No  Eyes Blurred vision?: No Double vision?: No  Ears/Nose/Throat Sore throat?: No Sinus problems?: No  Hematologic/Lymphatic Swollen glands?: No Easy bruising?: No  Cardiovascular Leg swelling?: No Chest pain?: No  Respiratory Cough?: No Shortness of breath?: No  Endocrine Excessive thirst?: No  Musculoskeletal Back pain?: No Joint pain?: No  Neurological Headaches?: No Dizziness?: Yes  Psychologic Depression?: No Anxiety?: No  Physical Exam: BP 104/64 (BP Location: Left Arm, Patient Position: Sitting, Cuff Size: Normal)   Pulse 84   Ht 5' (1.524 m)   Wt 98 lb 9.6 oz (44.7 kg)   BMI 19.26 kg/m   Constitutional: Well nourished. Alert and oriented, No acute distress. HEENT: [place] AT, moist mucus membranes. Trachea midline, no masses. Cardiovascular: No clubbing,  cyanosis, or edema. Respiratory: Normal respiratory effort, no increased work of breathing. Skin: No rashes, bruises or suspicious lesions. Lymph: No cervical or inguinal adenopathy. Neurologic: Grossly intact, no focal deficits, moving all 4 extremities. Psychiatric: Normal mood and affect.   Laboratory Data: Urinalysis CATH UA is negative.  See Epic. I have reviewed the labs.  Procedure  Patient is placed in stirrups and her urethral meatus and vulva are cleansed with Betadine.  2% Lidocaine jelly was inserted into her urethra.  I then dilated her with Elby Showers sounds to a 68 F without difficultly.  She tolerated the procedure well.    Assessment & Plan:    1. Urinary retention Patient voiding well now Patient will continue the tamsulosin for 3 days and then discontinue She will return as needed  Return if symptoms worsen or fail to improve.  Zara Council, PA-C    [facility] [address], [place], [place] [address] [phone]

## 2017-12-24 LAB — CULTURE, URINE COMPREHENSIVE

## 2018-01-25 ENCOUNTER — Ambulatory Visit: Payer: Medicare Other | Admitting: Family Medicine

## 2018-01-25 VITALS — BP 129/66 | HR 83 | Ht 60.0 in

## 2018-01-25 DIAGNOSIS — N390 Urinary tract infection, site not specified: Secondary | ICD-10-CM

## 2018-01-25 LAB — URINALYSIS, COMPLETE
Bilirubin, UA: NEGATIVE
GLUCOSE, UA: NEGATIVE
KETONES UA: NEGATIVE
Nitrite, UA: POSITIVE — AB
SPEC GRAV UA: 1.025 (ref 1.005–1.030)
UUROB: 0.2 mg/dL (ref 0.2–1.0)
pH, UA: 6.5 (ref 5.0–7.5)

## 2018-01-25 LAB — MICROSCOPIC EXAMINATION
Epithelial Cells (non renal): NONE SEEN /hpf (ref 0–10)
RBC, UA: >30 /hpf — ABNORMAL HIGH (ref 0–2)

## 2018-01-25 MED ORDER — NITROFURANTOIN MONOHYD MACRO 100 MG PO CAPS: 100.0000 mg | ORAL_CAPSULE | Freq: Two times a day (BID) | ORAL | 0 refills | Status: DC

## 2018-01-25 NOTE — Progress Notes (Signed)
Patient presents today with urinary frequency and dysuria, flank pain. Patient states she has been on ABX in the last 30 days and takes Oxybutynin PRN. Patient states her symptoms began about 3 days ago. Larene Beach reviewed the UA and Nitrofurantoin was sent to pharmacy.  In and Out Catheterization  Patient is present today for a I & O catheterization due to recurrent uti. Patient was cleaned and prepped in a sterile fashion with betadine and Lidocaine 2% jelly was instilled into the urethra.  A 14FR cath was inserted no complications were noted , 70ml of urine return was noted, urine was cloudy dark yellow  in color. A clean urine sample was collected for UA, UCX. Bladder was drained  And catheter was removed with out difficulty.    Preformed by: Elberta Leatherwood, CMA

## 2018-01-30 LAB — CULTURE, URINE COMPREHENSIVE

## 2018-01-31 ENCOUNTER — Telehealth: Payer: Self-pay

## 2018-01-31 DIAGNOSIS — R3129 Other microscopic hematuria: Secondary | ICD-10-CM

## 2018-01-31 NOTE — Telephone Encounter (Signed)
-----   Message from Nori Riis, PA-C sent at 01/31/2018  7:43 AM EST ----- Please let Mrs. Pulley know that her urine culture was positive for infection and that the nitrofurantoin should take care of the infection.  I would like her urine rechecked next week to make sure the blood in the urine is no longer present.

## 2018-01-31 NOTE — Telephone Encounter (Signed)
Patient notified on vmail, left mess to call back and schedule lab drop off, order placed

## 2018-02-09 ENCOUNTER — Ambulatory Visit (INDEPENDENT_AMBULATORY_CARE_PROVIDER_SITE_OTHER): Payer: Medicare Other

## 2018-02-09 DIAGNOSIS — N390 Urinary tract infection, site not specified: Secondary | ICD-10-CM

## 2018-02-09 DIAGNOSIS — R3129 Other microscopic hematuria: Secondary | ICD-10-CM | POA: Diagnosis not present

## 2018-02-09 NOTE — Progress Notes (Signed)
In and Out Catheterization  Patient is present today for a I & O catheterization due to UA recheck after infection. Patient was cleaned and prepped in a sterile fashion with betadine and Lidocaine 2% jelly was instilled into the urethra.  A 14FR cath was inserted no complications were noted , 45ml of urine return was noted, urine was yellow in color. A clean urine sample was collected for UA and culture. Bladder was drained  And catheter was removed with out difficulty.    Preformed by: Fonnie Jarvis, CMA  Follow up/ Additional notes: will call with results

## 2018-02-10 LAB — URINALYSIS, COMPLETE
Bilirubin, UA: NEGATIVE
Glucose, UA: NEGATIVE
Ketones, UA: NEGATIVE
LEUKOCYTES UA: NEGATIVE
NITRITE UA: NEGATIVE
PROTEIN UA: NEGATIVE
RBC, UA: NEGATIVE
Specific Gravity, UA: 1.01 (ref 1.005–1.030)
Urobilinogen, Ur: 0.2 mg/dL (ref 0.2–1.0)
pH, UA: 6.5 (ref 5.0–7.5)

## 2018-02-13 LAB — CULTURE, URINE COMPREHENSIVE

## 2018-02-14 ENCOUNTER — Telehealth: Payer: Self-pay

## 2018-02-14 NOTE — Telephone Encounter (Signed)
-----   Message from Nori Riis, PA-C sent at 02/14/2018  7:44 AM EST ----- Please let Mrs. Waugh know that her urine culture was negative.

## 2018-02-14 NOTE — Telephone Encounter (Signed)
Left message informing patient urine culture was negative.

## 2018-02-24 ENCOUNTER — Other Ambulatory Visit: Payer: Self-pay | Admitting: Urology

## 2018-02-24 MED ORDER — OXYBUTYNIN CHLORIDE 5 MG PO TABS: 5.0000 mg | ORAL_TABLET | Freq: Three times a day (TID) | ORAL | 1 refills | Status: DC | PRN

## 2018-02-24 NOTE — Telephone Encounter (Signed)
Script sent to pharmacy.

## 2018-02-24 NOTE — Telephone Encounter (Signed)
Pt asking for a refill on oxybutynin 5mg  at [facility] on [address]. Please advise. Thanks.

## 2018-04-15 ENCOUNTER — Ambulatory Visit (INDEPENDENT_AMBULATORY_CARE_PROVIDER_SITE_OTHER): Payer: Medicare Other | Admitting: Family Medicine

## 2018-04-15 ENCOUNTER — Encounter: Payer: Self-pay | Admitting: Family Medicine

## 2018-04-15 ENCOUNTER — Telehealth: Payer: Self-pay | Admitting: Urology

## 2018-04-15 VITALS — BP 119/71 | HR 84 | Ht 60.0 in | Wt 99.4 lb

## 2018-04-15 DIAGNOSIS — N390 Urinary tract infection, site not specified: Secondary | ICD-10-CM

## 2018-04-15 LAB — MICROSCOPIC EXAMINATION: Epithelial Cells (non renal): NONE SEEN /HPF (ref 0–10)

## 2018-04-15 LAB — URINALYSIS, COMPLETE
BILIRUBIN UA: NEGATIVE
GLUCOSE, UA: NEGATIVE
KETONES UA: NEGATIVE
NITRITE UA: POSITIVE — AB
SPEC GRAV UA: 1.015 (ref 1.005–1.030)
UUROB: 0.2 mg/dL (ref 0.2–1.0)
pH, UA: 6 (ref 5.0–7.5)

## 2018-04-15 MED ORDER — DOXYCYCLINE HYCLATE 100 MG PO CAPS: 100.0000 mg | ORAL_CAPSULE | Freq: Two times a day (BID) | ORAL | 0 refills | Status: DC

## 2018-04-15 NOTE — Progress Notes (Signed)
Patient presents today with dysuria, frequency, nausea and lower abdominal pain. Her symptoms began 1 week ago. Patient states she was given Cefuroxime 250mg  on 03/31/18 and her symptoms did not completely resolve. A urine was collected for UA, UCX. Patient states she has not had any Urological surgeries in the last 30 days. Dr. Bernardo Heater reviewed the UA and Doxycycline 100 mg was sent to pharmacy. We will call with results.

## 2018-04-15 NOTE — Telephone Encounter (Signed)
Patient called the office today requesting an appointment with Dr. Bernardo Heater.  His schedule is full today, so I added the patient the the nurse schedule.  She is experiencing painful urination and frequency.

## 2018-04-19 LAB — CULTURE, URINE COMPREHENSIVE

## 2018-04-22 ENCOUNTER — Telehealth: Payer: Self-pay | Admitting: Family Medicine

## 2018-04-22 NOTE — Telephone Encounter (Signed)
Patient called the office back.  She was already aware of the positive UTI, picked up the doxycycline on 04/15/2018 and has already finished taking it.  Her symptoms have resolved and she is doing well.

## 2018-04-22 NOTE — Telephone Encounter (Signed)
Unable to leave a message,mailbox is full. 

## 2018-04-22 NOTE — Telephone Encounter (Signed)
-----   Message from Abbie Sons, MD sent at 04/22/2018  1:45 PM EST ----- Urine culture was positive and sensitive to doxycycline

## 2018-04-22 NOTE — Telephone Encounter (Signed)
Left detailed message on [name], [name], [phone] voicemail that urine culture was positive and sensative to doxycyline.  Unable to reach patient on her phone.

## 2018-04-30 DIAGNOSIS — R350 Frequency of micturition: Secondary | ICD-10-CM | POA: Insufficient documentation

## 2018-04-30 DIAGNOSIS — N390 Urinary tract infection, site not specified: Secondary | ICD-10-CM | POA: Insufficient documentation

## 2018-05-11 ENCOUNTER — Telehealth: Payer: Self-pay | Admitting: Urology

## 2018-05-11 NOTE — Telephone Encounter (Signed)
Patient called and LM that she was having UTI symptoms. She did not say what they were. I called and left her a message to call the office back to schedule an appointment with a provider per [facility]. I spoke with Larene Beach and since the patient has not seen a provider since 2019 she needed to see a provider for this.   Sharyn Lull

## 2018-05-11 NOTE — Telephone Encounter (Signed)
Patient is on an ABX and we need her to finish this before we can see her. She has been on back to back abx and she will call the office back if she is still having symptoms. She just started the ABX 05-10-18 I advised her to take it and see if it works before making an appointment to come in because she needs to give it time to get in her system and work.   Sharyn Lull

## 2018-05-24 ENCOUNTER — Ambulatory Visit (INDEPENDENT_AMBULATORY_CARE_PROVIDER_SITE_OTHER): Payer: Medicare Other | Admitting: Urology

## 2018-05-24 ENCOUNTER — Ambulatory Visit: Payer: Medicare Other

## 2018-05-24 ENCOUNTER — Encounter: Payer: Self-pay | Admitting: Urology

## 2018-05-24 ENCOUNTER — Other Ambulatory Visit: Payer: Self-pay

## 2018-05-24 VITALS — BP 122/74 | HR 81 | Ht 60.0 in | Wt 97.4 lb

## 2018-05-24 DIAGNOSIS — R300 Dysuria: Secondary | ICD-10-CM

## 2018-05-24 DIAGNOSIS — N390 Urinary tract infection, site not specified: Secondary | ICD-10-CM | POA: Diagnosis not present

## 2018-05-24 LAB — URINALYSIS, COMPLETE
Bilirubin, UA: NEGATIVE
Glucose, UA: NEGATIVE
Ketones, UA: NEGATIVE
Nitrite, UA: NEGATIVE
Protein, UA: NEGATIVE
Specific Gravity, UA: 1.01 (ref 1.005–1.030)
UUROB: 0.2 mg/dL (ref 0.2–1.0)
pH, UA: 6.5 (ref 5.0–7.5)

## 2018-05-24 LAB — MICROSCOPIC EXAMINATION: Epithelial Cells (non renal): NONE SEEN /hpf (ref 0–10)

## 2018-05-24 MED ORDER — OXYBUTYNIN CHLORIDE 5 MG PO TABS: 5.0000 mg | ORAL_TABLET | Freq: Three times a day (TID) | ORAL | 1 refills | Status: DC | PRN

## 2018-05-24 MED ORDER — AMOXICILLIN-POT CLAVULANATE 875-125 MG PO TABS: 1.0000 | ORAL_TABLET | Freq: Two times a day (BID) | ORAL | 0 refills | Status: DC

## 2018-05-24 NOTE — Progress Notes (Signed)
05/24/2018 4:02 PM   Felicia Hughes 06-Aug-1933 [id number]  Referring provider: Tracie Harrier, [facility] [place], [place] [address]  Chief Complaint  Patient presents with  . Urinary Retention  . Urinary Frequency    HPI: Felicia Hughes is a 85 y.o. female Caucasian with a history of urinary retention, vaginal atrophy and rUTIs who presents today complaining of another UTI.  History of urinary retention Experienced urinary retention with Myrbetriq and had to have a Foley placed in 12/2016.   CATH UA had a PVR of 100 cc.    Vaginal atrophy Not applying the vaginal cream.  History of rUTI's Risk factors: age, vaginal atrophy, constipation, diarrhea and limited fluid intake. + E.coli resistant to ciprofloxacin and levofloxacin on 10/27/2017 + E.coli resistant to cipro and levofloxacin on 11/21/2017 + Proteus mirabilis resistant to cipro and levofloxacin on 12/07/2017 + E.coli resistant to cipro and levofloxacin on 12/29/2017 + E.coli resistant to cipro and levofloxacin on 01/15/2018 + E.coli resistant to ampicillin, cipro and levofloxacin on 01/25/2018 + E.coli pan sensitive on 03/23/2018 + E.coli pan sensitive on 04/15/2018 + E.coli pan sensitive on 05/10/2018   Today, she is complaining of frequency, urgency, dysuria, nocturia, incontinence, intermittency, straining to urinate and a weak urinary stream.  She is also complaining of suprapubic pain.  This suprapubic pain has been off and on for several years.  Patient denies any gross hematuria or flank pain.  Patient denies any fevers, chills, nausea or vomiting.   She states that her UTI's are happening back to back.  Once she completes an antibiotic, she has her symptoms return promptly.    She is not drinking a lot of water, she is not taking cranberry tablets/juice and she is not applying the vaginal estrogen cream.    Cath UA on 05/24/2018 returned >30 WBCs, many bacteria, cloudy BLO  trace-intact, LEU 1+.  PMH: Past Medical History:  Diagnosis Date  . Arrhythmia   . Chronic kidney disease    UTI  . GERD (gastroesophageal reflux disease)   . Glaucoma (increased eye pressure)   . Hypercholesterolemia   . Hypertension     Surgical History: Past Surgical History:  Procedure Laterality Date  . ABDOMINAL HYSTERECTOMY    . BREAST BIOPSY Right 06/11/2015   Procedure: BREAST BIOPSY WITH NEEDLE LOCALIZATION;  Surgeon: Leonie Green, MD;  Location: [facility] ORS;  Service: General;  Laterality: Right;  . BREAST EXCISIONAL BIOPSY Right 06/11/2015   NEG  . BREAST SURGERY Right    Breast Needle Biopsy  . CHOLECYSTECTOMY    . DILATION AND CURETTAGE OF UTERUS    . EYE SURGERY Bilateral    Cataract Extraction with IOL  . LAPAROSCOPIC OOPHORECTOMY      Home Medications:  Allergies as of 05/24/2018      Reactions   Nitrofurantoin Hives   Sulfa Antibiotics Rash      Medication List       Accurate as of May 24, 2018  4:02 PM. Always use your most recent med list.        amoxicillin-clavulanate 875-125 MG tablet Commonly known as:  AUGMENTIN Take 1 tablet by mouth every 12 (twelve) hours.   aspirin EC 81 MG tablet Take 81 mg by mouth daily.   atorvastatin 20 MG tablet Commonly known as:  LIPITOR Take 20 mg by mouth daily at 6 PM.   CENTRUM SILVER 50+WOMEN Tabs Take by mouth.   cholecalciferol 1000 units tablet Commonly known as:  VITAMIN D Take 1,000 Units by mouth daily.   estradiol 0.1 MG/GM vaginal cream Commonly known as:  ESTRACE VAGINAL Apply 0.5mg  (pea-sized amount)  just inside the vaginal introitus with a finger-tip every night for two weeks and then Monday, Wednesday and Friday nights.   losartan 50 MG tablet Commonly known as:  COZAAR Take 50 mg by mouth daily.   nitrofurantoin (macrocrystal-monohydrate) 100 MG capsule Commonly known as:  MACROBID Take 1 capsule (100 mg total) by mouth every 12 (twelve) hours.   oxybutynin 5 MG  tablet Commonly known as:  DITROPAN Take 1 tablet (5 mg total) by mouth 3 (three) times daily as needed for bladder spasms.   PROBIOTIC FORMULA PO Take 1 capsule by mouth daily.   tamsulosin 0.4 MG Caps capsule Commonly known as:  FLOMAX Take 1 capsule (0.4 mg total) by mouth daily.   vitamin B-12 1000 MCG tablet Commonly known as:  CYANOCOBALAMIN TAKE 1 TABLET BY MOUTH ONCE DAILY   zolpidem 10 MG tablet Commonly known as:  AMBIEN Take 10 mg by mouth at bedtime as needed for sleep.       Allergies:  Allergies  Allergen Reactions  . Nitrofurantoin Hives  . Sulfa Antibiotics Rash    Family History: Family History  Problem Relation Age of Onset  . Breast cancer Mother 4       early 18's  . Kidney cancer Neg Hx   . Kidney disease Neg Hx   . Prostate cancer Neg Hx   . Bladder Cancer Neg Hx     Social History:  reports that she has never smoked. She has never used smokeless tobacco. She reports current alcohol use of about 1.0 standard drinks of alcohol per week. She reports that she does not use drugs.  ROS: UROLOGY Frequent Urination?: Yes Hard to postpone urination?: Yes Burning/pain with urination?: Yes Get up at night to urinate?: Yes Leakage of urine?: Yes Urine stream starts and stops?: Yes Trouble starting stream?: No Do you have to strain to urinate?: Yes Blood in urine?: No Urinary tract infection?: Yes Sexually transmitted disease?: No Injury to kidneys or bladder?: No Painful intercourse?: No Weak stream?: Yes Currently pregnant?: No Vaginal bleeding?: No Last menstrual period?: n  Gastrointestinal Nausea?: No Vomiting?: No Indigestion/heartburn?: No Diarrhea?: No Constipation?: No  Constitutional Fever: No Night sweats?: No Weight loss?: No Fatigue?: No  Skin Skin rash/lesions?: No Itching?: No  Eyes Blurred vision?: No Double vision?: No  Ears/Nose/Throat Sore throat?: No Sinus problems?:  No  Hematologic/Lymphatic Swollen glands?: No Easy bruising?: No  Cardiovascular Leg swelling?: No Chest pain?: No  Respiratory Cough?: No Shortness of breath?: No  Endocrine Excessive thirst?: No  Musculoskeletal Back pain?: No Joint pain?: No  Neurological Headaches?: No Dizziness?: No  Psychologic Depression?: No Anxiety?: No  Physical Exam: BP 122/74   Pulse 81   Ht 5' (1.524 m)   Wt 97 lb 6.4 oz (44.2 kg)   SpO2 98%   BMI 19.02 kg/m   Constitutional: Well nourished. Alert and oriented, No acute distress. Cardiovascular: No clubbing, cyanosis, or edema. Respiratory: Normal respiratory effort, no increased work of breathing. GU: No CVA tenderness.  No bladder fullness or masses.  Atrophic external genitalia, sparce pubic hair distribution, no lesions.  Normal urethral meatus, no lesions, no prolapse, no discharge.   No urethral masses, tenderness and/or tenderness. No bladder fullness, tenderness or masses. pale vagina mucosa, poor estrogen effect. Skin: No rashes, bruises or suspicious lesions. Neurologic: Grossly intact, no  focal deficits, moving all 4 extremities. Psychiatric: Normal mood and affect.   Laboratory Data:  Urinalysis Cath UA on 05/24/2018 returned >30 WBCs, many bacteria, cloudy BLO trace-intact, LEU 1+.  Otherwise negative.  See Epic. I have reviewed the labs.  In and Out Catheterization Patient is present today for a I & O catheterization due to rUTI's. Patient was cleaned and prepped in a sterile fashion with betadine and Lidocaine 2% jelly was instilled into the urethra.  A 14 FR cath was inserted no complications were noted , 100 ml of urine return was noted, urine was yellow in color. A clean urine sample was collected for UA and urine culture. Bladder was drained  And catheter was removed with out difficulty.    Preformed by: Zara Council, PA-C  Assessment & Plan:    1. rUTI - Cath UA today (05/24/2018) suspicious for infection  - will send for culture - Started on Augmentin, will adjust once sensitivities are available - RUS to rule out possible nidus for rUTI's - RTC pending urine culture results - Patient is wanting a low dose antibiotic to take daily - advised patient that she must first engage in the UTI preventative strategies   2. Vaginal atrophy I explained to the patient that when women go through menopause and her estrogen levels are severely diminished, the normal vaginal flora will change.  This is due to an increase of the vaginal canal's pH. Because of this, the vaginal canal may be colonized by bacteria from the rectum instead of the protective lactobacillus.  This, accompanied by the loss of the mucus barrier with vaginal atrophy, is a cause of recurrent urinary tract infections. In some studies, the use of vaginal estrogen cream has been demonstrated to reduce  recurrent urinary tract infections to one a year.  Encouraged patient to start applying the vaginal estrogen cream tid  3. Bladder spasms Refilled oxybutynin 5 mg tid  Return for pending urine culture results .  Zara Council, PA-C  [facility] [address], [place], [place] [address] [phone]  I, Adele Schilder, am acting as a Education administrator for [employer], PA-C.   I have reviewed the above documentation for accuracy and completeness, and I agree with the above.    Zara Council, PA-C

## 2018-05-26 ENCOUNTER — Telehealth: Payer: Self-pay | Admitting: Family Medicine

## 2018-05-26 LAB — CULTURE, URINE COMPREHENSIVE

## 2018-05-26 NOTE — Telephone Encounter (Signed)
[facility] informing patient of result and to complete the course of ABX.

## 2018-05-26 NOTE — Telephone Encounter (Signed)
-----   Message from Nori Riis, PA-C sent at 05/26/2018  4:40 PM EST ----- Please let Mrs. Mcneice know that her urine culture was positive for infection and that the Augmentin is the appropriate antibiotic.  We are still waiting on her RUS to be scheduled.  Have they contacted her?

## 2018-05-30 ENCOUNTER — Telehealth: Payer: Self-pay | Admitting: Urology

## 2018-05-30 NOTE — Telephone Encounter (Signed)
Pt's Daughter Jacqlyn Larsen called and asked for a return call from Dr Bernardo Heater (she lives in [place]) She is listed on pt's DPR. She just has some questions about her Mom's recurrent UTI's .

## 2018-05-31 ENCOUNTER — Telehealth: Payer: Self-pay | Admitting: Urology

## 2018-05-31 NOTE — Telephone Encounter (Signed)
Pt's daughter, Jacqlyn Larsen, called again this morning asking if you would give her a call before her mom's appt tomorrow at 1:15.  She is a pediatrician in [place] and would like to speak with you about her mom's care prior to her appt.  Phone# [phone]  Daughter is on [facility]

## 2018-06-01 ENCOUNTER — Other Ambulatory Visit: Payer: Self-pay

## 2018-06-01 ENCOUNTER — Telehealth: Payer: Self-pay | Admitting: Urology

## 2018-06-01 ENCOUNTER — Encounter: Payer: Self-pay | Admitting: Urology

## 2018-06-01 ENCOUNTER — Ambulatory Visit: Payer: Medicare Other | Admitting: Urology

## 2018-06-01 VITALS — BP 125/71 | HR 94 | Ht 60.0 in | Wt 96.8 lb

## 2018-06-01 DIAGNOSIS — Z9889 Other specified postprocedural states: Secondary | ICD-10-CM

## 2018-06-01 DIAGNOSIS — E785 Hyperlipidemia, unspecified: Secondary | ICD-10-CM | POA: Insufficient documentation

## 2018-06-01 DIAGNOSIS — H409 Unspecified glaucoma: Secondary | ICD-10-CM | POA: Insufficient documentation

## 2018-06-01 DIAGNOSIS — I10 Essential (primary) hypertension: Secondary | ICD-10-CM | POA: Insufficient documentation

## 2018-06-01 DIAGNOSIS — N390 Urinary tract infection, site not specified: Secondary | ICD-10-CM

## 2018-06-01 LAB — BLADDER SCAN AMB NON-IMAGING

## 2018-06-01 LAB — URINALYSIS, COMPLETE
Bilirubin, UA: NEGATIVE
Glucose, UA: NEGATIVE
Ketones, UA: NEGATIVE
Leukocytes, UA: NEGATIVE
Nitrite, UA: NEGATIVE
Protein, UA: NEGATIVE
RBC, UA: NEGATIVE
Specific Gravity, UA: 1.01 (ref 1.005–1.030)
Urobilinogen, Ur: 0.2 mg/dL (ref 0.2–1.0)
pH, UA: 6.5 (ref 5.0–7.5)

## 2018-06-01 MED ORDER — CEPHALEXIN 500 MG PO TABS: 500.0000 mg | ORAL_TABLET | Freq: Every day | ORAL | 3 refills | Status: DC

## 2018-06-01 NOTE — Telephone Encounter (Signed)
I spoke with the patient's daughter on 3/10 regarding her recurrent UTIs and prior evaluation.  She stated her Ms. Rockwood was going to ask about low-dose antibiotic prophylactic therapy

## 2018-06-01 NOTE — Progress Notes (Signed)
06/01/2018 3:24 PM   Felicia Hughes 11-29-1933 [id number]  Referring provider: Tracie Harrier, MD [address] [facility] [place], [place] [address]  Chief Complaint  Patient presents with  . Recurrent UTI    HPI: 85 year old female with a long history of recurrent UTIs.  She is had a negative evaluation including negative cystoscopy, renal ultrasound and CT of the abdomen and pelvis.  She has been on probiotics and low-dose vaginal estrogen.  The frequency of her UTIs have been increasing this year.  She just finish an antibiotic course and is asymptomatic.  She denies febrile UTIs.  She has been on methenamine in the past and is inquiring today about low-dose antibiotic prophylaxis.  She has not previously been on cranberry but recently started cranberry supplements.  She also states she had a rectocele repaired on the right side several years ago but the "left side could not be repaired".  She is not seen a gynecologist in several years.  PVR has been intermittently elevated up to 100 mL but has been normal at times.  She takes immediate release oxybutynin as needed OAB symptoms  Denies gross hematuria.   PMH: Past Medical History:  Diagnosis Date  . Arrhythmia   . Chronic kidney disease    UTI  . GERD (gastroesophageal reflux disease)   . Glaucoma (increased eye pressure)   . Hypercholesterolemia   . Hypertension     Surgical History: Past Surgical History:  Procedure Laterality Date  . ABDOMINAL HYSTERECTOMY    . BREAST BIOPSY Right 06/11/2015   Procedure: BREAST BIOPSY WITH NEEDLE LOCALIZATION;  Surgeon: Leonie Green, MD;  Location: [facility] ORS;  Service: General;  Laterality: Right;  . BREAST EXCISIONAL BIOPSY Right 06/11/2015   NEG  . BREAST SURGERY Right    Breast Needle Biopsy  . CHOLECYSTECTOMY    . DILATION AND CURETTAGE OF UTERUS    . EYE SURGERY Bilateral    Cataract Extraction with IOL  . LAPAROSCOPIC OOPHORECTOMY      Home  Medications:  Allergies as of 06/01/2018      Reactions   Nitrofurantoin Hives   Sulfa Antibiotics Rash      Medication List       Accurate as of June 01, 2018  3:24 PM. Always use your most recent med list.        amoxicillin-clavulanate 875-125 MG tablet Commonly known as:  AUGMENTIN Take 1 tablet by mouth every 12 (twelve) hours.   aspirin EC 81 MG tablet Take 81 mg by mouth daily.   atorvastatin 20 MG tablet Commonly known as:  LIPITOR Take 20 mg by mouth daily at 6 PM.   Centrum Silver 50+Women Tabs Take by mouth.   cholecalciferol 1000 units tablet Commonly known as:  VITAMIN D Take 1,000 Units by mouth daily.   estradiol 0.1 MG/GM vaginal cream Commonly known as:  ESTRACE VAGINAL Apply 0.5mg  (pea-sized amount)  just inside the vaginal introitus with a finger-tip every night for two weeks and then Monday, Wednesday and Friday nights.   losartan 25 MG tablet Commonly known as:  COZAAR Take 25 mg by mouth daily.   oxybutynin 5 MG tablet Commonly known as:  DITROPAN Take 1 tablet (5 mg total) by mouth 3 (three) times daily as needed for bladder spasms.   PROBIOTIC FORMULA PO Take 1 capsule by mouth daily.   tamsulosin 0.4 MG Caps capsule Commonly known as:  FLOMAX Take 1 capsule (0.4 mg total) by mouth daily.  vitamin B-12 1000 MCG tablet Commonly known as:  CYANOCOBALAMIN TAKE 1 TABLET BY MOUTH ONCE DAILY   zolpidem 10 MG tablet Commonly known as:  AMBIEN Take 10 mg by mouth at bedtime as needed for sleep.       Allergies:  Allergies  Allergen Reactions  . Nitrofurantoin Hives  . Sulfa Antibiotics Rash    Family History: Family History  Problem Relation Age of Onset  . Breast cancer Mother 50       early 61's  . Kidney cancer Neg Hx   . Kidney disease Neg Hx   . Prostate cancer Neg Hx   . Bladder Cancer Neg Hx     Social History:  reports that she has never smoked. She has never used smokeless tobacco. She reports current alcohol  use of about 1.0 standard drinks of alcohol per week. She reports that she does not use drugs.  ROS: UROLOGY Frequent Urination?: Yes Hard to postpone urination?: Yes Burning/pain with urination?: No Get up at night to urinate?: Yes Leakage of urine?: Yes Urine stream starts and stops?: Yes Trouble starting stream?: Yes Do you have to strain to urinate?: Yes Blood in urine?: No Urinary tract infection?: Yes Sexually transmitted disease?: No Injury to kidneys or bladder?: No Painful intercourse?: No Weak stream?: Yes Currently pregnant?: No Vaginal bleeding?: No Last menstrual period?: Postmenopausal  Gastrointestinal Nausea?: No Vomiting?: No Indigestion/heartburn?: No Diarrhea?: No Constipation?: No  Constitutional Fever: No Night sweats?: No Weight loss?: Yes Fatigue?: No  Skin Skin rash/lesions?: No Itching?: No  Eyes Blurred vision?: No Double vision?: No  Ears/Nose/Throat Sore throat?: No Sinus problems?: No  Hematologic/Lymphatic Swollen glands?: No Easy bruising?: Yes  Cardiovascular Leg swelling?: No Chest pain?: No  Respiratory Shortness of breath?: No  Endocrine Excessive thirst?: No  Musculoskeletal Back pain?: No Joint pain?: No  Neurological Headaches?: No Dizziness?: No  Psychologic Depression?: No Anxiety?: No  Physical Exam: BP 125/71 (BP Location: Left Arm, Patient Position: Sitting, Cuff Size: Normal)   Pulse 94   Ht 5' (1.524 m)   Wt 96 lb 12.8 oz (43.9 kg)   BMI 18.90 kg/m   Constitutional:  Alert and oriented, No acute distress. HEENT: [place] AT, moist mucus membranes.  Trachea midline, no masses. Cardiovascular: No clubbing, cyanosis, or edema. Respiratory: Normal respiratory effort, no increased work of breathing. Skin: No rashes, bruises or suspicious lesions. Neurologic: Grossly intact, no focal deficits, moving all 4 extremities. Psychiatric: Normal mood and affect.  Laboratory Data:  Urinalysis:  Dipstick/microscopy negative  Assessment & Plan:    1. Recurrent UTI Urinalysis today was clear.  PVR was 130 mL.  Will start on low-dose prophylaxis with cephalexin 500 mg daily.  Follow-up 3 months and she was instructed to call earlier for recurrent UTI symptoms.  I did recommend a follow-up gynecologic evaluation  Return in about 3 months (around 09/01/2018) for Recheck, Bladder scan.   Abbie Sons, [place] [address] [place], [place] [address] [phone]

## 2018-06-01 NOTE — Telephone Encounter (Signed)
Pt called and asked if her rx had been called in, she wasn't sure of the name of it. I told her to give it 24 hours for a refill request. She stated she will check back tomorrrow.

## 2018-06-01 NOTE — Patient Instructions (Signed)

## 2018-06-02 ENCOUNTER — Encounter: Payer: Self-pay | Admitting: Urology

## 2018-06-15 ENCOUNTER — Other Ambulatory Visit: Payer: Self-pay | Admitting: Urology

## 2018-07-07 ENCOUNTER — Other Ambulatory Visit: Payer: Self-pay | Admitting: Urology

## 2018-08-04 ENCOUNTER — Other Ambulatory Visit: Payer: Self-pay | Admitting: Urology

## 2018-08-29 ENCOUNTER — Other Ambulatory Visit: Payer: Self-pay | Admitting: Urology

## 2018-09-01 ENCOUNTER — Encounter: Payer: Self-pay | Admitting: Urology

## 2018-09-01 ENCOUNTER — Other Ambulatory Visit: Payer: Self-pay

## 2018-09-01 ENCOUNTER — Ambulatory Visit: Payer: Medicare Other | Admitting: Urology

## 2018-09-01 VITALS — BP 110/66 | HR 92 | Ht 60.0 in | Wt 96.0 lb

## 2018-09-01 DIAGNOSIS — N390 Urinary tract infection, site not specified: Secondary | ICD-10-CM | POA: Diagnosis not present

## 2018-09-01 DIAGNOSIS — R339 Retention of urine, unspecified: Secondary | ICD-10-CM | POA: Diagnosis not present

## 2018-09-01 DIAGNOSIS — N3281 Overactive bladder: Secondary | ICD-10-CM

## 2018-09-01 LAB — BLADDER SCAN AMB NON-IMAGING

## 2018-09-01 MED ORDER — MIRABEGRON ER 25 MG PO TB24: 25.0000 mg | ORAL_TABLET | Freq: Every day | ORAL | 1 refills | Status: DC

## 2018-09-01 NOTE — Progress Notes (Signed)
09/01/2018 4:34 PM   Felicia Hughes October 10, 1933 [id number]  Referring provider: Tracie Harrier, MD [address] [facility] [place],  [place] [address]  Chief Complaint  Patient presents with  . Recurrent UTI    HPI: 85 year old female with recurrent UTI and overactive bladder presents for a 48-month follow-up.  Refer to my previous note of 06/01/2018.  She feels the low-dose cephalexin prophylaxis has been effective as she has not had any UTIs since her last visit.  She does however have urinary frequency, urgency and voiding small amounts with occasional sensation of incomplete emptying.  She is taking oxybutynin which does help her frequency and urgency.  PVR at last visit was 130 mL.  She had a prior history of pelvic floor relaxation and we discussed gynecology follow-up.  She states she was contacted by gynecology during [place] stay at home and her facility was not transporting patients for routine issues.   PMH: Past Medical History:  Diagnosis Date  . Arrhythmia   . Chronic kidney disease    UTI  . GERD (gastroesophageal reflux disease)   . Glaucoma (increased eye pressure)   . Hypercholesterolemia   . Hypertension     Surgical History: Past Surgical History:  Procedure Laterality Date  . ABDOMINAL HYSTERECTOMY    . BREAST BIOPSY Right 06/11/2015   Procedure: BREAST BIOPSY WITH NEEDLE LOCALIZATION;  Surgeon: Leonie Green, MD;  Location: [facility] ORS;  Service: General;  Laterality: Right;  . BREAST EXCISIONAL BIOPSY Right 06/11/2015   NEG  . BREAST SURGERY Right    Breast Needle Biopsy  . CHOLECYSTECTOMY    . DILATION AND CURETTAGE OF UTERUS    . EYE SURGERY Bilateral    Cataract Extraction with IOL  . LAPAROSCOPIC OOPHORECTOMY      Home Medications:  Allergies as of 09/01/2018      Reactions   Nitrofurantoin Hives   Sulfa Antibiotics Rash      Medication List       Accurate as of September 01, 2018  4:34 PM. If you have any questions, ask  your nurse or doctor.        STOP taking these medications   amoxicillin-clavulanate 875-125 MG tablet Commonly known as: AUGMENTIN Stopped by: Abbie Sons, MD   Cephalexin 500 MG tablet Stopped by: Abbie Sons, MD   PROBIOTIC FORMULA PO Stopped by: Abbie Sons, MD   vitamin B-12 1000 MCG tablet Commonly known as: CYANOCOBALAMIN Stopped by: Abbie Sons, MD     TAKE these medications   aspirin EC 81 MG tablet Take 81 mg by mouth daily.   atorvastatin 20 MG tablet Commonly known as: LIPITOR Take 20 mg by mouth daily at 6 PM.   Centrum Silver 50+Women Tabs Take by mouth.   cholecalciferol 1000 units tablet Commonly known as: VITAMIN D Take 1,000 Units by mouth daily.   estradiol 0.1 MG/GM vaginal cream Commonly known as: ESTRACE VAGINAL Apply 0.5mg  (pea-sized amount)  just inside the vaginal introitus with a finger-tip every night for two weeks and then Monday, Wednesday and Friday nights.   losartan 50 MG tablet Commonly known as: COZAAR Take 50 mg by mouth daily. What changed: Another medication with the same name was removed. Continue taking this medication, and follow the directions you see here. Changed by: Abbie Sons, MD   oxybutynin 5 MG tablet Commonly known as: DITROPAN TAKE 1 TABLET (5 MG TOTAL) BY MOUTH 3 (THREE) TIMES DAILY AS NEEDED FOR  BLADDER SPASMS.   tamsulosin 0.4 MG Caps capsule Commonly known as: FLOMAX Take 1 capsule (0.4 mg total) by mouth daily.   zolpidem 10 MG tablet Commonly known as: AMBIEN Take 10 mg by mouth at bedtime as needed for sleep.       Allergies:  Allergies  Allergen Reactions  . Nitrofurantoin Hives  . Sulfa Antibiotics Rash    Family History: Family History  Problem Relation Age of Onset  . Breast cancer Mother 58       early 55's  . Kidney cancer Neg Hx   . Kidney disease Neg Hx   . Prostate cancer Neg Hx   . Bladder Cancer Neg Hx     Social History:  reports that she has never  smoked. She has never used smokeless tobacco. She reports current alcohol use of about 1.0 standard drinks of alcohol per week. She reports that she does not use drugs.  ROS: UROLOGY Frequent Urination?: Yes Hard to postpone urination?: Yes Burning/pain with urination?: No Get up at night to urinate?: Yes Leakage of urine?: Yes Urine stream starts and stops?: Yes Trouble starting stream?: No Do you have to strain to urinate?: Yes Blood in urine?: No Urinary tract infection?: No Sexually transmitted disease?: No Injury to kidneys or bladder?: No Painful intercourse?: No Weak stream?: Yes Currently pregnant?: No Vaginal bleeding?: No Last menstrual period?: n  Gastrointestinal Nausea?: No Vomiting?: No Indigestion/heartburn?: No Diarrhea?: No Constipation?: No  Constitutional Fever: No Night sweats?: No Weight loss?: Yes Fatigue?: Yes  Skin Skin rash/lesions?: No Itching?: No  Eyes Blurred vision?: No Double vision?: No  Ears/Nose/Throat Sore throat?: No Sinus problems?: No  Hematologic/Lymphatic Swollen glands?: No Easy bruising?: Yes  Cardiovascular Leg swelling?: No Chest pain?: No  Respiratory Cough?: No Shortness of breath?: No  Endocrine Excessive thirst?: No  Musculoskeletal Back pain?: No Joint pain?: No  Neurological Headaches?: No Dizziness?: No  Psychologic Depression?: No Anxiety?: No  Physical Exam: BP 110/66 (BP Location: Left Arm, Patient Position: Sitting, Cuff Size: Normal)   Pulse 92   Ht 5' (1.524 m)   Wt 96 lb (43.5 kg)   BMI 18.75 kg/m   Constitutional:  Alert and oriented, No acute distress. HEENT: [place] AT, moist mucus membranes.  Trachea midline, no masses. Cardiovascular: No clubbing, cyanosis, or edema. Respiratory: Normal respiratory effort, no increased work of breathing. GI: Abdomen is soft, nontender, nondistended, no abdominal masses GU: No CVA tenderness Lymph: No cervical or inguinal lymphadenopathy.  Skin: No rashes, bruises or suspicious lesions. Neurologic: Grossly intact, no focal deficits, moving all 4 extremities. Psychiatric: Normal mood and affect.  Assessment & Plan:   PVR by bladder scan today was better at 62 mL.  She will continue the cephalexin.  I will have her discontinue the oxybutynin and give a trial of Myrbetriq 25 mg daily.  She was given samples and an Rx was sent to her pharmacy. (Lot: [id number] exp: 01/22)  She could not give a urine specimen today but will try before she leaves.   Abbie Sons, [place] [address], [place], [place] [address] [phone]

## 2018-09-05 ENCOUNTER — Telehealth: Payer: Self-pay

## 2018-09-05 DIAGNOSIS — N390 Urinary tract infection, site not specified: Secondary | ICD-10-CM

## 2018-09-05 NOTE — Telephone Encounter (Signed)
Called pt, no answer. Left detailed message for pt informing her of the need to come into the office and leave another urine specimen for ua and cx as first specimen was left out by lab.

## 2018-09-07 ENCOUNTER — Ambulatory Visit (INDEPENDENT_AMBULATORY_CARE_PROVIDER_SITE_OTHER): Payer: Medicare Other

## 2018-09-07 ENCOUNTER — Other Ambulatory Visit: Payer: Self-pay

## 2018-09-07 DIAGNOSIS — N390 Urinary tract infection, site not specified: Secondary | ICD-10-CM | POA: Diagnosis not present

## 2018-09-07 LAB — URINALYSIS, COMPLETE
Bilirubin, UA: NEGATIVE
Glucose, UA: NEGATIVE
Ketones, UA: NEGATIVE
Leukocytes,UA: NEGATIVE
Nitrite, UA: NEGATIVE
Protein,UA: NEGATIVE
RBC, UA: NEGATIVE
Specific Gravity, UA: 1.015 (ref 1.005–1.030)
Urobilinogen, Ur: 0.2 mg/dL (ref 0.2–1.0)
pH, UA: 5.5 (ref 5.0–7.5)

## 2018-09-07 LAB — MICROSCOPIC EXAMINATION
Bacteria, UA: NONE SEEN
RBC, Urine: NONE SEEN /hpf (ref 0–2)

## 2018-09-07 NOTE — Progress Notes (Addendum)
In and Out Catheterization  Patient is present today for a I & O catheterization due to urinary frequency. Patient was cleaned and prepped in a sterile fashion with betadine and Lidocaine 2% jelly was instilled into the urethra.  A 14FR cath was inserted no complications were noted , 49ml of urine return was noted, urine was yellow in color. A clean urine sample was collected for UA and culture. Bladder was drained  And catheter was removed with out difficulty.    Preformed by: Fonnie Jarvis, CMA  Per Dr. Erlene Quan patient was notified on vmail that UA was clear

## 2018-09-09 LAB — CULTURE, URINE COMPREHENSIVE

## 2018-09-13 ENCOUNTER — Telehealth: Payer: Self-pay

## 2018-09-13 NOTE — Telephone Encounter (Signed)
-----   Message from Abbie Sons, MD sent at 09/09/2018  1:24 PM EDT ----- I just saw her on 6/11 for these sxs and started her on myrbetriq ----- Message ----- From: Royanne Foots, [place]: 09/07/2018   1:05 PM EDT To: Abbie Sons, MD  Patient's UA was clear today , per Dr. Erlene Quan did not treat when would you like her to follow up?

## 2018-09-13 NOTE — Telephone Encounter (Signed)
Patient notified on vmail per DPR 

## 2018-09-15 ENCOUNTER — Telehealth: Payer: Self-pay | Admitting: *Deleted

## 2018-09-15 NOTE — Telephone Encounter (Addendum)
Left patient a voicemail with results, asked to call back with any questions.    ----- Message from Abbie Sons, MD sent at 09/15/2018  9:01 AM EDT ----- Urine culture was negative for infection

## 2018-09-26 ENCOUNTER — Other Ambulatory Visit: Payer: Self-pay | Admitting: Urology

## 2018-09-26 NOTE — Telephone Encounter (Signed)
Patient called the office and left a voice mail message that she is taking a low dose cephalexin as directed by Dr. Bernardo Heater, but needs a refill.  Please call her with questions.

## 2018-09-27 MED ORDER — CEPHALEXIN 500 MG PO CAPS: 500.0000 mg | ORAL_CAPSULE | Freq: Every day | ORAL | 3 refills | Status: DC

## 2018-09-27 NOTE — Telephone Encounter (Signed)
Pt states Stoioff has her on 1 pill per day.  She only has 1 or 2 left and [facility] [address].  She was here on 6/17 to drop off a urine.

## 2018-09-27 NOTE — Telephone Encounter (Signed)
LMOM informing patient medication was sent to pharmacy.

## 2018-10-20 ENCOUNTER — Other Ambulatory Visit: Payer: Self-pay | Admitting: Urology

## 2018-11-16 ENCOUNTER — Other Ambulatory Visit: Payer: Self-pay | Admitting: Urology

## 2018-11-29 ENCOUNTER — Ambulatory Visit: Payer: Medicare Other | Admitting: Urology

## 2018-11-30 ENCOUNTER — Encounter: Payer: Self-pay | Admitting: Urology

## 2018-11-30 ENCOUNTER — Ambulatory Visit (INDEPENDENT_AMBULATORY_CARE_PROVIDER_SITE_OTHER): Payer: Medicare Other | Admitting: Urology

## 2018-11-30 ENCOUNTER — Other Ambulatory Visit: Payer: Self-pay

## 2018-11-30 VITALS — BP 149/65 | HR 98 | Ht 60.0 in | Wt 96.0 lb

## 2018-11-30 DIAGNOSIS — N390 Urinary tract infection, site not specified: Secondary | ICD-10-CM | POA: Diagnosis not present

## 2018-11-30 DIAGNOSIS — R339 Retention of urine, unspecified: Secondary | ICD-10-CM | POA: Diagnosis not present

## 2018-11-30 NOTE — Progress Notes (Signed)
11/30/2018 2:22 PM   Felicia Hughes 04/03/1933 [id number]  Referring provider: Tracie Harrier, MD [address] [facility] [place],  [place] [address]  Chief Complaint  Patient presents with  . Recurrent UTI  . Follow-up    Urologic history:  1.  Recurrent UTI  -On cephalexin suppression, low-dose vaginal estrogen  2.  Overactive bladder  -On oxybutynin  -No improvement with Myrbetriq   HPI: Ms. Felicia Hughes presents for 29-month follow-up.  She continues to do well on cephalexin suppression without recurrent UTI.  At her last visit she was given a trial of Myrbetriq 25 mg and states this did not help her frequency and urgency as well as the oxybutynin.  She does complain of dry mouth and sensation of incomplete emptying on oxybutynin.   PMH: Past Medical History:  Diagnosis Date  . Arrhythmia   . Chronic kidney disease    UTI  . GERD (gastroesophageal reflux disease)   . Glaucoma (increased eye pressure)   . Hypercholesterolemia   . Hypertension     Surgical History: Past Surgical History:  Procedure Laterality Date  . ABDOMINAL HYSTERECTOMY    . BREAST BIOPSY Right 06/11/2015   Procedure: BREAST BIOPSY WITH NEEDLE LOCALIZATION;  Surgeon: Leonie Green, MD;  Location: [facility] ORS;  Service: General;  Laterality: Right;  . BREAST EXCISIONAL BIOPSY Right 06/11/2015   NEG  . BREAST SURGERY Right    Breast Needle Biopsy  . CHOLECYSTECTOMY    . DILATION AND CURETTAGE OF UTERUS    . EYE SURGERY Bilateral    Cataract Extraction with IOL  . LAPAROSCOPIC OOPHORECTOMY      Home Medications:  Allergies as of 11/30/2018      Reactions   Nitrofurantoin Hives   Sulfa Antibiotics Rash      Medication List       Accurate as of November 30, 2018  2:22 PM. If you have any questions, ask your nurse or doctor.        STOP taking these medications   tamsulosin 0.4 MG Caps capsule Commonly known as: FLOMAX Stopped by: Abbie Sons, MD     TAKE  these medications   aspirin EC 81 MG tablet Take 81 mg by mouth daily.   atorvastatin 20 MG tablet Commonly known as: LIPITOR Take 20 mg by mouth daily at 6 PM.   Centrum Silver 50+Women Tabs Take by mouth.   cephALEXin 500 MG capsule Commonly known as: Keflex Take 1 capsule (500 mg total) by mouth daily.   cholecalciferol 1000 units tablet Commonly known as: VITAMIN D Take 1,000 Units by mouth daily.   estradiol 0.1 MG/GM vaginal cream Commonly known as: ESTRACE VAGINAL Apply 0.5mg  (pea-sized amount)  just inside the vaginal introitus with a finger-tip every night for two weeks and then Monday, Wednesday and Friday nights.   losartan 25 MG tablet Commonly known as: COZAAR Take 25 mg by mouth daily. What changed: Another medication with the same name was removed. Continue taking this medication, and follow the directions you see here. Changed by: Abbie Sons, MD   mirabegron ER 25 MG Tb24 tablet Commonly known as: MYRBETRIQ Take 1 tablet (25 mg total) by mouth daily.   oxybutynin 5 MG tablet Commonly known as: DITROPAN TAKE 1 TABLET (5 MG TOTAL) BY MOUTH 3 (THREE) TIMES DAILY AS NEEDED FOR BLADDER SPASMS.   temazepam 7.5 MG capsule Commonly known as: RESTORIL TAKE 1 CAPSULE (7.5 MG TOTAL) BY MOUTH NIGHTLY AS NEEDED FOR  SLEEP FOR UP TO 30 DAYS   zolpidem 10 MG tablet Commonly known as: AMBIEN Take 10 mg by mouth at bedtime as needed for sleep.       Allergies:  Allergies  Allergen Reactions  . Nitrofurantoin Hives  . Sulfa Antibiotics Rash    Family History: Family History  Problem Relation Age of Onset  . Breast cancer Mother 28       early 46's  . Kidney cancer Neg Hx   . Kidney disease Neg Hx   . Prostate cancer Neg Hx   . Bladder Cancer Neg Hx     Social History:  reports that she has never smoked. She has never used smokeless tobacco. She reports current alcohol use of about 1.0 standard drinks of alcohol per week. She reports that she does  not use drugs.  ROS: UROLOGY Frequent Urination?: Yes Hard to postpone urination?: Yes Burning/pain with urination?: No Get up at night to urinate?: Yes Leakage of urine?: Yes Urine stream starts and stops?: No Trouble starting stream?: Yes Do you have to strain to urinate?: Yes Blood in urine?: No Urinary tract infection?: No Sexually transmitted disease?: No Injury to kidneys or bladder?: No Painful intercourse?: No Weak stream?: Yes Currently pregnant?: No Vaginal bleeding?: No Last menstrual period?: N/A  Gastrointestinal Nausea?: No Vomiting?: No Indigestion/heartburn?: No Diarrhea?: No Constipation?: No  Constitutional Fever: No Night sweats?: No Weight loss?: Yes Fatigue?: Yes  Skin Skin rash/lesions?: No Itching?: No  Eyes Blurred vision?: Yes Double vision?: No  Ears/Nose/Throat Sore throat?: No Sinus problems?: No  Hematologic/Lymphatic Swollen glands?: No Easy bruising?: Yes  Cardiovascular Leg swelling?: No Chest pain?: No  Respiratory Cough?: No Shortness of breath?: No  Endocrine Excessive thirst?: No  Musculoskeletal Back pain?: No Joint pain?: No  Neurological Headaches?: No Dizziness?: Yes  Psychologic Depression?: No Anxiety?: No  Physical Exam: BP (!) 149/65 (BP Location: Left Arm, Patient Position: Sitting, Cuff Size: Small)   Pulse 98   Ht 5' (1.524 m)   Wt 96 lb (43.5 kg)   BMI 18.75 kg/m   Constitutional:  Alert and oriented, No acute distress. HEENT: [place] AT, moist mucus membranes.  Trachea midline, no masses. Cardiovascular: No clubbing, cyanosis, or edema. Respiratory: Normal respiratory effort, no increased work of breathing. GI: Abdomen is soft, nontender, nondistended, no abdominal masses Skin: No rashes, bruises or suspicious lesions. Neurologic: Grossly intact, no focal deficits, moving all 4 extremities. Psychiatric: Normal mood and affect.   Assessment & Plan:    - Recurrent UTI Doing well on  cephalexin suppression  - Incomplete bladder emptying PVR by bladder scan today showed no significant residual.  - Overactive bladder She does have side effects with oxybutynin.  Myrbetriq was not effective.  Discussed PTNS and she was given a brochure.  She will call back if interested.  Follow-up 6 months  Abbie Sons, MD  [facility] [address] [place],  [address] [phone]

## 2018-12-01 LAB — MICROSCOPIC EXAMINATION
Bacteria, UA: NONE SEEN
RBC, Urine: NONE SEEN /hpf (ref 0–2)

## 2018-12-01 LAB — URINALYSIS, COMPLETE
Bilirubin, UA: NEGATIVE
Glucose, UA: NEGATIVE
Ketones, UA: NEGATIVE
Leukocytes,UA: NEGATIVE
Nitrite, UA: NEGATIVE
Protein,UA: NEGATIVE
RBC, UA: NEGATIVE
Specific Gravity, UA: 1.015 (ref 1.005–1.030)
Urobilinogen, Ur: 0.2 mg/dL (ref 0.2–1.0)
pH, UA: 6 (ref 5.0–7.5)

## 2018-12-05 ENCOUNTER — Telehealth: Payer: Self-pay | Admitting: *Deleted

## 2018-12-05 NOTE — Telephone Encounter (Signed)
Notified patient as instructed, Left  Message on VM

## 2018-12-05 NOTE — Telephone Encounter (Signed)
-----   Message from Abbie Sons, MD sent at 12/03/2018 12:27 PM EDT ----- Urinalysis was normal

## 2019-01-10 ENCOUNTER — Ambulatory Visit: Payer: Medicare Other | Admitting: Physician Assistant

## 2019-01-19 ENCOUNTER — Ambulatory Visit: Payer: Medicare Other | Admitting: Physician Assistant

## 2019-01-19 ENCOUNTER — Other Ambulatory Visit: Payer: Self-pay

## 2019-01-19 ENCOUNTER — Encounter: Payer: Self-pay | Admitting: Physician Assistant

## 2019-01-19 VITALS — BP 136/60 | HR 78 | Ht 60.0 in | Wt 93.0 lb

## 2019-01-19 DIAGNOSIS — R350 Frequency of micturition: Secondary | ICD-10-CM | POA: Diagnosis not present

## 2019-01-19 DIAGNOSIS — N390 Urinary tract infection, site not specified: Secondary | ICD-10-CM

## 2019-01-19 DIAGNOSIS — R300 Dysuria: Secondary | ICD-10-CM | POA: Diagnosis not present

## 2019-01-19 LAB — URINALYSIS, COMPLETE
Bilirubin, UA: NEGATIVE
Glucose, UA: NEGATIVE
Ketones, UA: NEGATIVE
Leukocytes,UA: NEGATIVE
Nitrite, UA: NEGATIVE
Protein,UA: NEGATIVE
RBC, UA: NEGATIVE
Specific Gravity, UA: 1.015 (ref 1.005–1.030)
Urobilinogen, Ur: 0.2 mg/dL (ref 0.2–1.0)
pH, UA: 6 (ref 5.0–7.5)

## 2019-01-19 LAB — MICROSCOPIC EXAMINATION
Bacteria, UA: NONE SEEN
Epithelial Cells (non renal): NONE SEEN /hpf (ref 0–10)
RBC, Urine: NONE SEEN /hpf (ref 0–2)

## 2019-01-19 LAB — BLADDER SCAN AMB NON-IMAGING

## 2019-01-19 MED ORDER — CEPHALEXIN 500 MG PO CAPS: 500.0000 mg | ORAL_CAPSULE | Freq: Every day | ORAL | 3 refills | Status: DC

## 2019-01-19 NOTE — Progress Notes (Signed)
01/19/2019 9:37 AM   Felicia Hughes 1933/10/04 [id number]  CC: Dysuria, frequency  HPI: Felicia Minder is a 85 y.o. female who presents today for evaluation of possible UTI. She is an established [facility] patient who last saw Dr. Bernardo Heater on 11/30/2018 for routine follow-up of recurrent UTI on Keflex suppression and low-dose vaginal estrogen cream as well as OAB on oxybutynin.  She reports an 8-day history of urinary frequency, suprapubic pressure, and dysuria. She denies fevers, chills, nausea, vomiting, flank pain, and gross hematuria. She has taken oxybutynin at home with moderate symptom palliation.  She does report frequency, urgency, and low volume voids with occasional sensation of incomplete emptying at baseline.  She also reports a 1 day history of diarrhea, treated at home with Imodium with slight improvement.  In-office UA and microscopy pan-negative. PVR 199mL.  PMH: Past Medical History:  Diagnosis Date  . Arrhythmia   . Chronic kidney disease    UTI  . GERD (gastroesophageal reflux disease)   . Glaucoma (increased eye pressure)   . Hypercholesterolemia   . Hypertension     Surgical History: Past Surgical History:  Procedure Laterality Date  . ABDOMINAL HYSTERECTOMY    . BREAST BIOPSY Right 06/11/2015   Procedure: BREAST BIOPSY WITH NEEDLE LOCALIZATION;  Surgeon: Leonie Green, MD;  Location: [facility] ORS;  Service: General;  Laterality: Right;  . BREAST EXCISIONAL BIOPSY Right 06/11/2015   NEG  . BREAST SURGERY Right    Breast Needle Biopsy  . CHOLECYSTECTOMY    . DILATION AND CURETTAGE OF UTERUS    . EYE SURGERY Bilateral    Cataract Extraction with IOL  . LAPAROSCOPIC OOPHORECTOMY      Home Medications:  Allergies as of 01/19/2019      Reactions   Nitrofurantoin Hives   Sulfa Antibiotics Rash      Medication List       Accurate as of January 19, 2019 11:59 PM. If you have any questions, ask your nurse or doctor.        STOP taking these medications    mirabegron ER 25 MG Tb24 tablet Commonly known as: MYRBETRIQ Stopped by: Debroah Loop, PA-C   oxybutynin 5 MG tablet Commonly known as: DITROPAN Stopped by: Debroah Loop, PA-C   temazepam 7.5 MG capsule Commonly known as: RESTORIL Stopped by: Debroah Loop, PA-C     TAKE these medications   aspirin EC 81 MG tablet Take 81 mg by mouth daily.   atorvastatin 20 MG tablet Commonly known as: LIPITOR Take 20 mg by mouth daily at 6 PM.   Centrum Silver 50+Women Tabs Take by mouth.   cephALEXin 500 MG capsule Commonly known as: Keflex Take 1 capsule (500 mg total) by mouth daily.   cholecalciferol 1000 units tablet Commonly known as: VITAMIN D Take 1,000 Units by mouth daily.   estradiol 0.1 MG/GM vaginal cream Commonly known as: ESTRACE VAGINAL Apply 0.5mg  (pea-sized amount)  just inside the vaginal introitus with a finger-tip every night for two weeks and then Monday, Wednesday and Friday nights.   losartan 25 MG tablet Commonly known as: COZAAR Take 25 mg by mouth daily.   zolpidem 10 MG tablet Commonly known as: AMBIEN Take 10 mg by mouth at bedtime as needed for sleep.       Allergies:  Allergies  Allergen Reactions  . Nitrofurantoin Hives  . Sulfa Antibiotics Rash    Family History: Family History  Problem Relation Age of Onset  . Breast cancer Mother 11  early 33's  . Kidney cancer Neg Hx   . Kidney disease Neg Hx   . Prostate cancer Neg Hx   . Bladder Cancer Neg Hx     Social History:   reports that she has never smoked. She has never used smokeless tobacco. She reports current alcohol use of about 1.0 standard drinks of alcohol per week. She reports that she does not use drugs.  ROS: UROLOGY Frequent Urination?: Yes Hard to postpone urination?: Yes Burning/pain with urination?: Yes Get up at night to urinate?: Yes Leakage of urine?: Yes Urine stream starts and stops?: No Trouble starting stream?: No Do you  have to strain to urinate?: No Blood in urine?: No Urinary tract infection?: No Sexually transmitted disease?: No Injury to kidneys or bladder?: No Painful intercourse?: No Weak stream?: No Currently pregnant?: No Vaginal bleeding?: No Last menstrual period?: N  Gastrointestinal Nausea?: No Vomiting?: No Indigestion/heartburn?: No Diarrhea?: No Constipation?: No  Constitutional Fever: No Night sweats?: No Weight loss?: No Fatigue?: No  Skin Skin rash/lesions?: No Itching?: No  Eyes Blurred vision?: No Double vision?: No  Ears/Nose/Throat Sore throat?: No Sinus problems?: No  Hematologic/Lymphatic Swollen glands?: No Easy bruising?: No  Cardiovascular Leg swelling?: No Chest pain?: No  Respiratory Cough?: No Shortness of breath?: No  Endocrine Excessive thirst?: No  Musculoskeletal Back pain?: No Joint pain?: No  Neurological Headaches?: No Dizziness?: No  Psychologic Depression?: No Anxiety?: No  Physical Exam: BP 136/60   Pulse 78   Ht 5' (1.524 m)   Wt 93 lb (42.2 kg)   BMI 18.16 kg/m   Constitutional:  Alert and oriented, no acute distress, nontoxic appearing HEENT: [place], AT Cardiovascular: No clubbing, cyanosis, or edema Respiratory: Normal respiratory effort, no increased work of breathing GI: Abdomen is soft, nontender, nondistended, no abdominal masses GU: No CVA tenderness Lymph: No cervical or inguinal lymphadenopathy Skin: No rashes, bruises or suspicious lesions Neurologic: Grossly intact, no focal deficits, moving all 4 extremities Psychiatric: Normal mood and affect  Laboratory Data: Results for orders placed or performed in visit on 01/19/19  Microscopic Examination   URINE  Result Value Ref Range   WBC, UA 0-5 0 - 5 /hpf   RBC None seen 0 - 2 /hpf   Epithelial Cells (non renal) None seen 0 - 10 /hpf   Bacteria, UA None seen None seen/Few  Urinalysis, Complete  Result Value Ref Range   Specific Gravity, UA 1.015  1.005 - 1.030   pH, UA 6.0 5.0 - 7.5   Color, UA Yellow Yellow   Appearance Ur Clear Clear   Leukocytes,UA Negative Negative   Protein,UA Negative Negative/Trace   Glucose, UA Negative Negative   Ketones, UA Negative Negative   RBC, UA Negative Negative   Bilirubin, UA Negative Negative   Urobilinogen, Ur 0.2 0.2 - 1.0 mg/dL   Nitrite, UA Negative Negative   Microscopic Examination See below:   Bladder Scan (Post Void Residual) in office  Result Value Ref Range   Scan Result 129ML    Assessment & Plan:   1. Urinary frequency PVR WNL today.  Patient taking oxybutynin approximately 3 times daily.  I believe the symptom may recently be worsened due to GI distress.  I counseled patient to follow-up with her PCP for diarrhea continues and/or if she develops abdominal pain, fevers, chills or otherwise worsens.  She expressed understanding. - Bladder Scan (Post Void Residual) in office  2. Dysuria Patient presents with irritative voiding symptoms, however UA is  pan negative today.  Will send for culture.  Provided patient with Uribel samples and advised her to contact the office if this medication provides adequate symptom palliation, at which point I will prescribe the medication for her.  I also provided her with a coupon to use for this.  I counseled the patient on dietary modifications that can alleviate irritative bladder symptoms, including the avoidance of caffeine, alcohol, carbonated beverages, chocolate, spicy foods, and acidic foods including citrus and tomatoes.  - Urinalysis, Complete - CULTURE, URINE COMPREHENSIVE  3. Recurrent UTI Patient requests Keflex refill today.  Sent to her pharmacy. - cephALEXin (KEFLEX) 500 MG capsule; Take 1 capsule (500 mg total) by mouth daily.  Dispense: 30 capsule; Refill: 3  Return if symptoms worsen or fail to improve.  Debroah Loop, PA-C  [facility] [address] [place], [place] [address]  [phone]

## 2019-01-22 ENCOUNTER — Other Ambulatory Visit: Payer: Self-pay | Admitting: Urology

## 2019-01-22 LAB — CULTURE, URINE COMPREHENSIVE

## 2019-01-23 ENCOUNTER — Telehealth: Payer: Self-pay | Admitting: Physician Assistant

## 2019-01-23 ENCOUNTER — Other Ambulatory Visit: Payer: Self-pay | Admitting: Physician Assistant

## 2019-01-23 DIAGNOSIS — R300 Dysuria: Secondary | ICD-10-CM

## 2019-01-23 MED ORDER — URIBEL 118 MG PO CAPS: 1.0000 | ORAL_CAPSULE | Freq: Four times a day (QID) | ORAL | 3 refills | Status: DC | PRN

## 2019-01-23 NOTE — Telephone Encounter (Signed)
Pt used all Uribel samples that Sam gave her and says they work well.  She would [facility] to [facility] [address].

## 2019-01-23 NOTE — Telephone Encounter (Signed)
Rx sent. LMOM to notify patient.

## 2019-01-24 NOTE — Telephone Encounter (Signed)
Pt returned call and I told her RX was sent to pharmacy.

## 2019-02-07 ENCOUNTER — Ambulatory Visit: Payer: Medicare Other | Admitting: Physician Assistant

## 2019-02-07 ENCOUNTER — Other Ambulatory Visit: Payer: Self-pay

## 2019-02-07 ENCOUNTER — Other Ambulatory Visit: Payer: Self-pay | Admitting: Physician Assistant

## 2019-02-07 ENCOUNTER — Encounter: Payer: Self-pay | Admitting: Physician Assistant

## 2019-02-07 VITALS — BP 130/73 | HR 90 | Ht 60.0 in | Wt 93.0 lb

## 2019-02-07 DIAGNOSIS — R310 Gross hematuria: Secondary | ICD-10-CM | POA: Diagnosis not present

## 2019-02-07 DIAGNOSIS — R300 Dysuria: Secondary | ICD-10-CM | POA: Diagnosis not present

## 2019-02-07 MED ORDER — OXYBUTYNIN CHLORIDE ER 10 MG PO TB24: 10.0000 mg | ORAL_TABLET | Freq: Every day | ORAL | 2 refills | Status: DC

## 2019-02-07 NOTE — Progress Notes (Signed)
02/07/2019 12:31 PM   Felicia Hughes 1933-10-26 [id number]  CC: Gross hematuria, lower abdominal pain  HPI: Felicia Bent is a 85 y.o. female who presents today for evaluation of possible UTI. She is an established [facility] patient who last saw me on 01/19/2019 for dysuria and urinary frequency. .  She reports a 1 day history of lower abdominal pain and urinary frequency with an episode of gross hematuria last night.  She denies fever, chills, nausea, vomiting, dysuria, and urgency.  She is a never smoker.  No industrial exposures.  No family history of renal or bladder cancers.  No history of nephrolithiasis.  In-office UA and microscopy today pan-negative.  PMH: Past Medical History:  Diagnosis Date  . Arrhythmia   . Chronic kidney disease    UTI  . GERD (gastroesophageal reflux disease)   . Glaucoma (increased eye pressure)   . Hypercholesterolemia   . Hypertension     Surgical History: Past Surgical History:  Procedure Laterality Date  . ABDOMINAL HYSTERECTOMY    . BREAST BIOPSY Right 06/11/2015   Procedure: BREAST BIOPSY WITH NEEDLE LOCALIZATION;  Surgeon: Leonie Green, MD;  Location: [facility] ORS;  Service: General;  Laterality: Right;  . BREAST EXCISIONAL BIOPSY Right 06/11/2015   NEG  . BREAST SURGERY Right    Breast Needle Biopsy  . CHOLECYSTECTOMY    . DILATION AND CURETTAGE OF UTERUS    . EYE SURGERY Bilateral    Cataract Extraction with IOL  . LAPAROSCOPIC OOPHORECTOMY      Home Medications:  Allergies as of 02/07/2019      Reactions   Nitrofurantoin Hives   Sulfa Antibiotics Rash      Medication List       Accurate as of February 07, 2019 11:59 PM. If you have any questions, ask your nurse or doctor.        aspirin EC 81 MG tablet Take 81 mg by mouth daily.   atorvastatin 20 MG tablet Commonly known as: LIPITOR Take 20 mg by mouth daily at 6 PM.   Centrum Silver 50+Women Tabs Take by mouth.   cephALEXin 500 MG capsule Commonly known as:  Keflex Take 1 capsule (500 mg total) by mouth daily.   cholecalciferol 1000 units tablet Commonly known as: VITAMIN D Take 1,000 Units by mouth daily.   estradiol 0.1 MG/GM vaginal cream Commonly known as: ESTRACE VAGINAL Apply 0.5mg  (pea-sized amount)  just inside the vaginal introitus with a finger-tip every night for two weeks and then Monday, Wednesday and Friday nights.   losartan 25 MG tablet Commonly known as: COZAAR Take 25 mg by mouth daily.   oxybutynin 10 MG 24 hr tablet Commonly known as: DITROPAN-XL Take 1 tablet (10 mg total) by mouth daily. Started by: Debroah Loop, PA-C   Uribel 118 MG Caps Take 1 capsule (118 mg total) by mouth 4 (four) times daily as needed.   zolpidem 10 MG tablet Commonly known as: AMBIEN Take 10 mg by mouth at bedtime as needed for sleep.       Allergies:  Allergies  Allergen Reactions  . Nitrofurantoin Hives  . Sulfa Antibiotics Rash    Family History: Family History  Problem Relation Age of Onset  . Breast cancer Mother 75       early 4's  . Kidney cancer Neg Hx   . Kidney disease Neg Hx   . Prostate cancer Neg Hx   . Bladder Cancer Neg Hx     Social History:  reports that she has never smoked. She has never used smokeless tobacco. She reports current alcohol use of about 1.0 standard drinks of alcohol per week. She reports that she does not use drugs.  ROS: UROLOGY Frequent Urination?: Yes Hard to postpone urination?: Yes Burning/pain with urination?: No Get up at night to urinate?: Yes Leakage of urine?: Yes Urine stream starts and stops?: Yes Trouble starting stream?: No Do you have to strain to urinate?: Yes Blood in urine?: Yes Urinary tract infection?: Yes Sexually transmitted disease?: No Injury to kidneys or bladder?: No Painful intercourse?: No Weak stream?: Yes Currently pregnant?: No Vaginal bleeding?: No  Gastrointestinal Nausea?: No Vomiting?: No Indigestion/heartburn?: No  Diarrhea?: Yes Constipation?: No  Constitutional Fever: No Night sweats?: No Weight loss?: No Fatigue?: No  Skin Skin rash/lesions?: No Itching?: No  Eyes Blurred vision?: Yes Double vision?: No  Ears/Nose/Throat Sore throat?: No Sinus problems?: No  Hematologic/Lymphatic Swollen glands?: No Easy bruising?: Yes  Cardiovascular Leg swelling?: No Chest pain?: No  Respiratory Cough?: No Shortness of breath?: No  Endocrine Excessive thirst?: Yes  Musculoskeletal Back pain?: No Joint pain?: No  Neurological Headaches?: No Dizziness?: Yes  Psychologic Depression?: No Anxiety?: No  Physical Exam: BP 130/73   Pulse 90   Ht 5' (1.524 m)   Wt 93 lb (42.2 kg)   BMI 18.16 kg/m   Constitutional:  Alert and oriented, no acute distress, nontoxic appearing HEENT: [place], AT Cardiovascular: No clubbing, cyanosis, or edema Respiratory: Normal respiratory effort, no increased work of breathing Skin: No rashes, bruises or suspicious lesions Neurologic: Grossly intact, no focal deficits, moving all 4 extremities Psychiatric: Normal mood and affect  Laboratory Data: Results for orders placed or performed in visit on 02/07/19  Microscopic Examination   URINE  Result Value Ref Range   WBC, UA None seen 0 - 5 /hpf   RBC 0-2 0 - 2 /hpf   Epithelial Cells (non renal) None seen 0 - 10 /hpf   Bacteria, UA None seen None seen/Few  Urinalysis, Complete  Result Value Ref Range   Specific Gravity, UA 1.015 1.005 - 1.030   pH, UA 7.0 5.0 - 7.5   Color, UA Black Yellow   Appearance Ur Clear Clear   Leukocytes,UA Negative Negative   Protein,UA Negative Negative/Trace   Glucose, UA Negative Negative   Ketones, UA Negative Negative   RBC, UA Negative Negative   Bilirubin, UA Negative Negative   Urobilinogen, Ur 0.2 0.2 - 1.0 mg/dL   Nitrite, UA Negative Negative   Microscopic Examination See below:    Assessment & Plan:   1. Dysuria Patient with a 1 day history of  lower abdominal pain and dysuria.  UA reassuring for infection.  Will send for culture to confirm.  I saw her for the same complaint with a negative urine culture less than 1 month ago.  I would like to further evaluate her at this point, the below.  Patient is previously taken oxybutynin and requests a refill at this time.  I switching her to a long-acting version today given her age to decrease possible CNS side effects. - Urinalysis, Complete - CULTURE, URINE COMPREHENSIVE - oxybutynin (DITROPAN-XL) 10 MG 24 hr tablet; Take 1 tablet (10 mg total) by mouth daily.  Dispense: 30 tablet; Refill: 2  2. Hematuria, gross Patient reports an episode of gross hematuria last night.  No MH on UA today, however would like to proceed with hematuria work-up at this point.  CT ordered today.  I like her to follow-up with CT results and cystoscopy in about a month.  She expressed understanding. - CT HEMATURIA WORKUP   Return in about 4 weeks (around 03/07/2019) for CTU results and cystoscopy.  Debroah Loop, PA-C  [facility] [address], [place], [place] [address] [phone]

## 2019-02-08 LAB — URINALYSIS, COMPLETE
Bilirubin, UA: NEGATIVE
Glucose, UA: NEGATIVE
Ketones, UA: NEGATIVE
Leukocytes,UA: NEGATIVE
Nitrite, UA: NEGATIVE
Protein,UA: NEGATIVE
RBC, UA: NEGATIVE
Specific Gravity, UA: 1.015 (ref 1.005–1.030)
Urobilinogen, Ur: 0.2 mg/dL (ref 0.2–1.0)
pH, UA: 7 (ref 5.0–7.5)

## 2019-02-08 LAB — MICROSCOPIC EXAMINATION
Bacteria, UA: NONE SEEN
Epithelial Cells (non renal): NONE SEEN /hpf (ref 0–10)
WBC, UA: NONE SEEN /hpf (ref 0–5)

## 2019-02-10 LAB — CULTURE, URINE COMPREHENSIVE

## 2019-02-27 ENCOUNTER — Other Ambulatory Visit: Payer: Self-pay | Admitting: Physician Assistant

## 2019-02-27 DIAGNOSIS — R300 Dysuria: Secondary | ICD-10-CM

## 2019-03-06 ENCOUNTER — Ambulatory Visit
Admission: RE | Admit: 2019-03-06 | Discharge: 2019-03-06 | Disposition: A | Discharge status: Home / Self Care | Payer: Medicare Other | Source: Ambulatory Visit | Attending: Physician Assistant | Admitting: Physician Assistant

## 2019-03-06 ENCOUNTER — Other Ambulatory Visit: Payer: Self-pay

## 2019-03-06 DIAGNOSIS — R310 Gross hematuria: Secondary | ICD-10-CM | POA: Diagnosis present

## 2019-03-06 LAB — POCT I-STAT CREATININE: Creatinine, Ser: 0.8 mg/dL (ref 0.44–1.00)

## 2019-03-06 MED ORDER — IOHEXOL 300 MG/ML  SOLN: 125.0000 mL | Freq: Once | INTRAMUSCULAR | Status: DC | PRN

## 2019-03-06 MED ORDER — IOHEXOL 300 MG/ML  SOLN
100.0000 mL | Freq: Once | INTRAMUSCULAR | Status: AC | PRN
  Administered 2019-03-06: 100 mL via INTRAVENOUS

## 2019-03-09 ENCOUNTER — Encounter: Payer: Self-pay | Admitting: Urology

## 2019-03-09 ENCOUNTER — Other Ambulatory Visit: Payer: Self-pay

## 2019-03-09 ENCOUNTER — Ambulatory Visit: Payer: Medicare Other | Admitting: Urology

## 2019-03-09 VITALS — BP 150/84 | HR 81 | Ht 60.0 in | Wt 93.0 lb

## 2019-03-09 DIAGNOSIS — N390 Urinary tract infection, site not specified: Secondary | ICD-10-CM

## 2019-03-09 DIAGNOSIS — R310 Gross hematuria: Secondary | ICD-10-CM | POA: Diagnosis not present

## 2019-03-09 MED ORDER — LIDOCAINE HCL URETHRAL/MUCOSAL 2 % EX GEL
1.0000 "application " | Freq: Once | CUTANEOUS | Status: AC
  Administered 2019-03-09: 1 via URETHRAL

## 2019-03-09 NOTE — Progress Notes (Signed)
   03/09/19  CC:  Chief Complaint  Patient presents with  . Cysto    HPI: 85 y.o. female with a long history of recurrent UTI and overactive bladder.  She saw Thomas Hoff on 02/07/2019 with lower abdominal pain, frequency and mild hematuria.  Urinalysis was clear and urine culture negative.  CT urogram and cystoscopy were recommended.  CTU showed no upper tract abnormalities.  She has no complaints today.  Blood pressure (!) 150/84, pulse 81, height 5' (1.524 m), weight 93 lb (42.2 kg). NED. A&Ox3.   No respiratory distress   Abd soft, NT, ND Normal external genitalia with patent urethral meatus  Cystoscopy Procedure Note  Patient identification was confirmed, informed consent was obtained, and patient was prepped using Betadine solution.  Lidocaine jelly was administered per urethral meatus.    Procedure: - Flexible cystoscope introduced, without any difficulty.   - Thorough search of the bladder revealed:    normal/patent urethral meatus    normal urothelium    no stones    no ulcers     no tumors    no urethral polyps    no trabeculation  - Ureteral orifices were normal in position and appearance.  Post-Procedure: - Patient tolerated the procedure well  Assessment/ Plan: -Bladder looks quite well with her history of recurrent infections.  No abnormalities noted. -Urine was sent for cytology. -Keep scheduled follow-up and call as needed for worsening symptoms   Abbie Sons, MD

## 2019-03-15 ENCOUNTER — Other Ambulatory Visit: Payer: Self-pay | Admitting: Urology

## 2019-03-21 ENCOUNTER — Telehealth: Payer: Self-pay | Admitting: Urology

## 2019-03-21 NOTE — Telephone Encounter (Signed)
Urine cytology showed no abnormal or malignant cells.

## 2019-03-22 NOTE — Telephone Encounter (Signed)
Called pt no answer. Left detailed message per DPR. Advised pt to call back for questions or concerns.  

## 2019-04-03 ENCOUNTER — Other Ambulatory Visit: Payer: Self-pay | Admitting: Internal Medicine

## 2019-04-03 DIAGNOSIS — Z1231 Encounter for screening mammogram for malignant neoplasm of breast: Secondary | ICD-10-CM

## 2019-04-24 ENCOUNTER — Ambulatory Visit
Admission: RE | Admit: 2019-04-24 | Discharge: 2019-04-24 | Disposition: A | Discharge status: Home / Self Care | Payer: Medicare Other | Source: Ambulatory Visit | Attending: Internal Medicine | Admitting: Internal Medicine

## 2019-04-24 DIAGNOSIS — Z1231 Encounter for screening mammogram for malignant neoplasm of breast: Secondary | ICD-10-CM | POA: Diagnosis not present

## 2019-05-12 ENCOUNTER — Other Ambulatory Visit: Payer: Self-pay | Admitting: Physician Assistant

## 2019-05-12 DIAGNOSIS — R300 Dysuria: Secondary | ICD-10-CM

## 2019-05-22 ENCOUNTER — Other Ambulatory Visit: Payer: Self-pay

## 2019-05-22 DIAGNOSIS — N390 Urinary tract infection, site not specified: Secondary | ICD-10-CM

## 2019-05-22 MED ORDER — CEPHALEXIN 500 MG PO CAPS: 500.0000 mg | ORAL_CAPSULE | Freq: Every day | ORAL | 3 refills | Status: DC

## 2019-05-22 NOTE — Telephone Encounter (Signed)
Incoming call from pt requesting refill on rx for Keflex. RX sent. Pt scheduled to see provider next week.

## 2019-06-01 ENCOUNTER — Other Ambulatory Visit: Payer: Self-pay

## 2019-06-01 ENCOUNTER — Ambulatory Visit: Payer: Medicare Other | Admitting: Urology

## 2019-06-01 ENCOUNTER — Encounter: Payer: Self-pay | Admitting: Urology

## 2019-06-01 VITALS — BP 117/73 | HR 99 | Ht 60.0 in | Wt 92.0 lb

## 2019-06-01 DIAGNOSIS — N3281 Overactive bladder: Secondary | ICD-10-CM | POA: Diagnosis not present

## 2019-06-01 DIAGNOSIS — N390 Urinary tract infection, site not specified: Secondary | ICD-10-CM | POA: Diagnosis not present

## 2019-06-01 LAB — BLADDER SCAN AMB NON-IMAGING: Scan Result: 23

## 2019-06-01 MED ORDER — URO-MP 118 MG PO CAPS: 1.0000 | ORAL_CAPSULE | Freq: Three times a day (TID) | ORAL | 1 refills | Status: DC | PRN

## 2019-06-01 NOTE — Progress Notes (Signed)
06/01/2019 2:01 PM   Niger Heemstra 1933/10/22 [id number]  Referring provider: Tracie Harrier, MD [address] [facility] [place],  [place] [address]  Chief Complaint  Patient presents with  . Follow-up    1.  Recurrent UTI -On cephalexin suppression, low-dose vaginal estrogen  2.  Overactive bladder -On oxybutynin, Uribel -No improvement with Myrbetriq  HPI: 86 y.o. female presents for follow-up.  -Last seen 03/09/19 for cystoscopy which was negative -CTU unremarkable -Remains on low-dose cephalexin prophylaxis and has been infection free since her last visit -Remains on oxybutynin -Uribel has also been beneficial prn   PMH: Past Medical History:  Diagnosis Date  . Arrhythmia   . Chronic kidney disease    UTI  . GERD (gastroesophageal reflux disease)   . Glaucoma (increased eye pressure)   . Hypercholesterolemia   . Hypertension     Surgical History: Past Surgical History:  Procedure Laterality Date  . ABDOMINAL HYSTERECTOMY    . BREAST BIOPSY Right 06/11/2015   Procedure: BREAST BIOPSY WITH NEEDLE LOCALIZATION;  Surgeon: Leonie Green, MD;  Location: [facility] ORS;  Service: General;  Laterality: Right;  . BREAST EXCISIONAL BIOPSY Right 06/11/2015   papilloma  . BREAST SURGERY Right    Breast Needle Biopsy  . CHOLECYSTECTOMY    . DILATION AND CURETTAGE OF UTERUS    . EYE SURGERY Bilateral    Cataract Extraction with IOL  . LAPAROSCOPIC OOPHORECTOMY      Home Medications:  Allergies as of 06/01/2019      Reactions   Nitrofurantoin Hives   Sulfa Antibiotics Rash      Medication List       Accurate as of June 01, 2019  2:01 PM. If you have any questions, ask your nurse or doctor.        aspirin EC 81 MG tablet Take 81 mg by mouth daily.   atorvastatin 20 MG tablet Commonly known as: LIPITOR Take 20 mg by mouth daily at 6 PM.   Centrum Silver 50+Women Tabs Take by mouth.   cephALEXin 500 MG capsule Commonly known  as: Keflex Take 1 capsule (500 mg total) by mouth daily.   cholecalciferol 1000 units tablet Commonly known as: VITAMIN D Take 1,000 Units by mouth daily.   estradiol 0.1 MG/GM vaginal cream Commonly known as: ESTRACE VAGINAL Apply 0.5mg  (pea-sized amount)  just inside the vaginal introitus with a finger-tip every night for two weeks and then Monday, Wednesday and Friday nights.   losartan 25 MG tablet Commonly known as: COZAAR Take 25 mg by mouth daily.   oxybutynin 10 MG 24 hr tablet Commonly known as: DITROPAN-XL TAKE 1 TABLET BY MOUTH EVERY DAY   Uro-MP 118 MG Caps TAKE 1 CAPSULE (118 MG TOTAL) BY MOUTH 4 (FOUR) TIMES DAILY AS NEEDED.   zolpidem 10 MG tablet Commonly known as: AMBIEN Take 10 mg by mouth at bedtime as needed for sleep.       Allergies:  Allergies  Allergen Reactions  . Nitrofurantoin Hives  . Sulfa Antibiotics Rash    Family History: Family History  Problem Relation Age of Onset  . Breast cancer Mother 24       early 69's  . Kidney cancer Neg Hx   . Kidney disease Neg Hx   . Prostate cancer Neg Hx   . Bladder Cancer Neg Hx     Social History:  reports that she has never smoked. She has never used smokeless tobacco. She reports current alcohol  use of about 1.0 standard drinks of alcohol per week. She reports that she does not use drugs.   Physical Exam: BP 117/73 (BP Location: Left Arm, Patient Position: Sitting, Cuff Size: Normal)   Pulse 99   Ht 5' (1.524 m)   Wt 92 lb (41.7 kg)   BMI 17.97 kg/m   Constitutional:  Alert and oriented, No acute distress. HEENT: [place] AT, moist mucus membranes.  Trachea midline, no masses. Cardiovascular: No clubbing, cyanosis, or edema. Respiratory: Normal respiratory effort, no increased work of breathing. Psychiatric: Normal mood and affect.   Assessment & Plan:    - Recurrent UTI  Doing well on low-dose cephalexin suppression.  She did not refill  - Overactive bladder Stable.  Requested refill  on the Uribel.  PVR by bladder scan today was 23 mL.   Follow-up 6 months  Abbie Sons, MD  [facility] [address] [place], [place] [address] [phone]

## 2019-06-02 ENCOUNTER — Encounter: Payer: Self-pay | Admitting: Urology

## 2019-07-26 ENCOUNTER — Other Ambulatory Visit: Payer: Self-pay | Admitting: Urology

## 2019-07-26 DIAGNOSIS — R300 Dysuria: Secondary | ICD-10-CM

## 2019-08-23 ENCOUNTER — Ambulatory Visit: Payer: Medicare Other | Admitting: Urology

## 2019-09-06 ENCOUNTER — Other Ambulatory Visit: Payer: Self-pay | Admitting: Physician Assistant

## 2019-09-06 ENCOUNTER — Other Ambulatory Visit: Payer: Self-pay | Admitting: Urology

## 2019-09-06 DIAGNOSIS — N3281 Overactive bladder: Secondary | ICD-10-CM

## 2019-09-06 DIAGNOSIS — N390 Urinary tract infection, site not specified: Secondary | ICD-10-CM

## 2019-10-17 ENCOUNTER — Other Ambulatory Visit: Payer: Self-pay | Admitting: Urology

## 2019-10-17 DIAGNOSIS — R300 Dysuria: Secondary | ICD-10-CM

## 2019-11-13 ENCOUNTER — Other Ambulatory Visit: Payer: Self-pay | Admitting: Urology

## 2019-11-13 DIAGNOSIS — N3281 Overactive bladder: Secondary | ICD-10-CM

## 2019-11-28 ENCOUNTER — Other Ambulatory Visit: Payer: Self-pay | Admitting: Family Medicine

## 2019-12-04 MED ORDER — METH-HYO-M BL-BENZ ACD-PH SAL 81.6 MG PO TABS: ORAL_TABLET | ORAL | 0 refills | Status: DC

## 2019-12-06 NOTE — Progress Notes (Signed)
12/07/2019 2:46 PM   Felicia Hughes [birth year] [id number]  Referring provider: Tracie Harrier, MD [address] [facility] [place],  [place] [address] Chief Complaint  Patient presents with  . Follow-up   Urologic history: 1.Recurrent UTI -On cephalexin suppression, low-dose vaginal estrogen  2.Overactive bladder -On oxybutynin, Uribel -No improvement with Myrbetriq  HPI: Felicia Hughes is a 86 y.o. female who presents today for a 6 month follow up of rUTI and OAB.  -Patient remains on low-dose cephalexin prophylaxis without rUTIs. -She is on oxybutynin -Ran out of uribel, no dysuria -She reports urgency and frequency.  -PVR 25 mL.    PMH: Past Medical History:  Diagnosis Date  . Arrhythmia   . Chronic kidney disease    UTI  . GERD (gastroesophageal reflux disease)   . Glaucoma (increased eye pressure)   . Hypercholesterolemia   . Hypertension     Surgical History: Past Surgical History:  Procedure Laterality Date  . ABDOMINAL HYSTERECTOMY    . BREAST BIOPSY Right 06/11/2015   Procedure: BREAST BIOPSY WITH NEEDLE LOCALIZATION;  Surgeon: Leonie Green, MD;  Location: [facility] ORS;  Service: General;  Laterality: Right;  . BREAST EXCISIONAL BIOPSY Right 06/11/2015   papilloma  . BREAST SURGERY Right    Breast Needle Biopsy  . CHOLECYSTECTOMY    . DILATION AND CURETTAGE OF UTERUS    . EYE SURGERY Bilateral    Cataract Extraction with IOL  . LAPAROSCOPIC OOPHORECTOMY      Home Medications:  Allergies as of 12/07/2019      Reactions   Nitrofurantoin Hives   Sulfa Antibiotics Rash      Medication List       Accurate as of December 07, 2019  2:46 PM. If you have any questions, ask your nurse or doctor.        aspirin EC 81 MG tablet Take 81 mg by mouth daily.   atorvastatin 20 MG tablet Commonly known as: LIPITOR Take 20 mg by mouth daily at 6 PM.   Centrum Silver 50+Women Tabs Take by mouth.   cephALEXin 500 MG  capsule Commonly known as: KEFLEX TAKE 1 CAPSULE (500 MG TOTAL) BY MOUTH DAILY.   cholecalciferol 1000 units tablet Commonly known as: VITAMIN D Take 1,000 Units by mouth daily.   estradiol 0.1 MG/GM vaginal cream Commonly known as: ESTRACE VAGINAL Apply 0.5mg  (pea-sized amount)  just inside the vaginal introitus with a finger-tip every night for two weeks and then Monday, Wednesday and Friday nights.   losartan 25 MG tablet Commonly known as: COZAAR Take 25 mg by mouth daily.   Meth-Hyo-M Bl-Benz Acd-Ph Sal 81.6 MG Tabs 1 tab every 8 hours as needed for burning, frequency, urgency   oxybutynin 10 MG 24 hr tablet Commonly known as: DITROPAN-XL TAKE 1 TABLET BY MOUTH EVERY DAY   phenazopyridine 100 MG tablet Commonly known as: PYRIDIUM Please specify directions, refills and quantity   zolpidem 10 MG tablet Commonly known as: AMBIEN Take 10 mg by mouth at bedtime as needed for sleep.       Allergies:  Allergies  Allergen Reactions  . Nitrofurantoin Hives  . Sulfa Antibiotics Rash    Family History: Family History  Problem Relation Age of Onset  . Breast cancer Mother 14       early 60's  . Kidney cancer Neg Hx   . Kidney disease Neg Hx   . Prostate cancer Neg Hx   . Bladder Cancer Neg Hx  Social History:  reports that she has never smoked. She has never used smokeless tobacco. She reports current alcohol use of about 1.0 standard drink of alcohol per week. She reports that she does not use drugs.   Physical Exam: BP 114/72   Ht 5' (1.524 m)   Wt 94 lb (42.6 kg)   BMI 18.36 kg/m   Constitutional:  Alert and oriented, No acute distress. HEENT: [place] AT, moist mucus membranes.  Trachea midline, no masses. Cardiovascular: No clubbing, cyanosis, or edema. Respiratory: Normal respiratory effort, no increased work of breathing. GI: Abdomen is soft, nontender, nondistended, no abdominal masses GU: No CVA tenderness Lymph: No cervical or inguinal  lymphadenopathy. Skin: No rashes, bruises or suspicious lesions. Neurologic: Grossly intact, no focal deficits, moving all 4 extremities. Psychiatric: Normal mood and affect.    Pertinent Imaging: Results for orders placed or performed in visit on 12/07/19  Bladder Scan (Post Void Residual) in office  Result Value Ref Range   Scan Result 25     Assessment & Plan:    1. rUTI No recent infections, on low-dose cephalexin suppression.   2. OAB (overactive bladder) Continue Oxybutynin Will add Myrbetriq 25 mg daily, samples given.  RTC in 6 months for follow up.   No follow-ups on file.  [facility] [address] [place], [place] [address] [phone]  I, Selena Batten, am acting as a scribe for Dr. Nicki Reaper C. Tiann Saha,  I have reviewed the above documentation for accuracy and completeness, and I agree with the above.    Abbie Sons, MD

## 2019-12-07 ENCOUNTER — Ambulatory Visit: Payer: Medicare Other | Admitting: Urology

## 2019-12-07 ENCOUNTER — Other Ambulatory Visit: Payer: Self-pay

## 2019-12-07 ENCOUNTER — Encounter: Payer: Self-pay | Admitting: Urology

## 2019-12-07 VITALS — BP 114/72 | Ht 60.0 in | Wt 94.0 lb

## 2019-12-07 DIAGNOSIS — N390 Urinary tract infection, site not specified: Secondary | ICD-10-CM | POA: Diagnosis not present

## 2019-12-07 DIAGNOSIS — N3281 Overactive bladder: Secondary | ICD-10-CM

## 2019-12-07 LAB — BLADDER SCAN AMB NON-IMAGING: Scan Result: 25

## 2020-01-08 ENCOUNTER — Other Ambulatory Visit: Payer: Self-pay | Admitting: Urology

## 2020-01-08 DIAGNOSIS — N390 Urinary tract infection, site not specified: Secondary | ICD-10-CM

## 2020-01-11 ENCOUNTER — Ambulatory Visit: Payer: Medicare Other | Admitting: Gastroenterology

## 2020-01-26 ENCOUNTER — Other Ambulatory Visit: Payer: Self-pay | Admitting: Urology

## 2020-02-17 ENCOUNTER — Other Ambulatory Visit: Payer: Self-pay | Admitting: Urology

## 2020-02-17 DIAGNOSIS — R300 Dysuria: Secondary | ICD-10-CM

## 2020-05-17 ENCOUNTER — Other Ambulatory Visit: Payer: Self-pay | Admitting: Urology

## 2020-05-17 DIAGNOSIS — N390 Urinary tract infection, site not specified: Secondary | ICD-10-CM

## 2020-05-31 ENCOUNTER — Other Ambulatory Visit: Payer: Self-pay | Admitting: Internal Medicine

## 2020-05-31 DIAGNOSIS — Z1231 Encounter for screening mammogram for malignant neoplasm of breast: Secondary | ICD-10-CM

## 2020-06-05 ENCOUNTER — Encounter: Payer: Self-pay | Admitting: Urology

## 2020-06-05 ENCOUNTER — Other Ambulatory Visit: Payer: Self-pay

## 2020-06-05 ENCOUNTER — Ambulatory Visit: Payer: Medicare Other | Admitting: Urology

## 2020-06-05 VITALS — BP 146/70 | HR 92 | Ht 60.0 in | Wt 96.0 lb

## 2020-06-05 DIAGNOSIS — N390 Urinary tract infection, site not specified: Secondary | ICD-10-CM | POA: Diagnosis not present

## 2020-06-05 DIAGNOSIS — N3941 Urge incontinence: Secondary | ICD-10-CM

## 2020-06-05 DIAGNOSIS — N3281 Overactive bladder: Secondary | ICD-10-CM | POA: Diagnosis not present

## 2020-06-05 LAB — BLADDER SCAN AMB NON-IMAGING: Scan Result: 0

## 2020-06-05 MED ORDER — CEPHALEXIN 500 MG PO CAPS: 500.0000 mg | ORAL_CAPSULE | Freq: Every day | ORAL | 3 refills | Status: DC

## 2020-06-06 ENCOUNTER — Encounter: Payer: Self-pay | Admitting: Urology

## 2020-06-06 MED ORDER — TROSPIUM CHLORIDE 20 MG PO TABS: 20.0000 mg | ORAL_TABLET | Freq: Every day | ORAL | 0 refills | Status: DC

## 2020-06-06 NOTE — Progress Notes (Signed)
06/05/2020 7:07 AM   Niger Terrill November 16, 1933 [id number]  Referring provider: Tracie Harrier, MD [address] [facility] [place],  [place] [address]  Chief Complaint  Patient presents with  . Other    Urologic history: 1.Recurrent UTI -On cephalexin suppression, low-dose vaginal estrogen  2.Overactive bladder -On oxybutynin, Uribel -No improvement with Myrbetriq  HPI: 87 y.o. female presents for 75-month follow-up.   Remains on cephalexin suppression and has been UTI free  She has discontinued oxybutynin and has noted increased urinary frequency and urgency  Denies dysuria, gross hematuria  Denies flank, abdominal or pelvic pain   PMH: Past Medical History:  Diagnosis Date  . Arrhythmia   . Chronic kidney disease    UTI  . GERD (gastroesophageal reflux disease)   . Glaucoma (increased eye pressure)   . Hypercholesterolemia   . Hypertension     Surgical History: Past Surgical History:  Procedure Laterality Date  . ABDOMINAL HYSTERECTOMY    . BREAST BIOPSY Right 06/11/2015   Procedure: BREAST BIOPSY WITH NEEDLE LOCALIZATION;  Surgeon: Leonie Green, MD;  Location: [facility] ORS;  Service: General;  Laterality: Right;  . BREAST EXCISIONAL BIOPSY Right 06/11/2015   papilloma  . BREAST SURGERY Right    Breast Needle Biopsy  . CHOLECYSTECTOMY    . DILATION AND CURETTAGE OF UTERUS    . EYE SURGERY Bilateral    Cataract Extraction with IOL  . LAPAROSCOPIC OOPHORECTOMY      Home Medications:  Allergies as of 06/05/2020      Reactions   Nitrofurantoin Hives   Sulfa Antibiotics Rash      Medication List       Accurate as of June 05, 2020 11:59 PM. If you have any questions, ask your nurse or doctor.        aspirin EC 81 MG tablet Take 81 mg by mouth daily.   atorvastatin 20 MG tablet Commonly known as: LIPITOR Take 20 mg by mouth daily at 6 PM.   Centrum Silver 50+Women Tabs Take by mouth.   cephALEXin 500 MG  capsule Commonly known as: KEFLEX Take 1 capsule (500 mg total) by mouth daily.   cholecalciferol 1000 units tablet Commonly known as: VITAMIN D Take 1,000 Units by mouth daily.   estradiol 0.1 MG/GM vaginal cream Commonly known as: ESTRACE VAGINAL Apply 0.5mg  (pea-sized amount)  just inside the vaginal introitus with a finger-tip every night for two weeks and then Monday, Wednesday and Friday nights.   losartan 25 MG tablet Commonly known as: COZAAR Take 25 mg by mouth daily.   oxybutynin 10 MG 24 hr tablet Commonly known as: DITROPAN-XL TAKE 1 TABLET BY MOUTH EVERY DAY   zolpidem 10 MG tablet Commonly known as: AMBIEN Take 10 mg by mouth at bedtime as needed for sleep.       Allergies:  Allergies  Allergen Reactions  . Nitrofurantoin Hives  . Sulfa Antibiotics Rash    Family History: Family History  Problem Relation Age of Onset  . Breast cancer Mother 44       early 71's  . Kidney cancer Neg Hx   . Kidney disease Neg Hx   . Prostate cancer Neg Hx   . Bladder Cancer Neg Hx     Social History:  reports that she has never smoked. She has never used smokeless tobacco. She reports current alcohol use of about 1.0 standard drink of alcohol per week. She reports that she does not use drugs.  Physical Exam: BP (!) 146/70   Pulse 92   Ht 5' (1.524 m)   Wt 96 lb (43.5 kg)   BMI 18.75 kg/m   Constitutional:  Alert and oriented, No acute distress. HEENT: [place] AT, moist mucus membranes.  Trachea midline, no masses. Cardiovascular: No clubbing, cyanosis, or edema. Respiratory: Normal respiratory effort, no increased work of breathing.   Assessment & Plan:    1. Recurrent UTI  Doing well on cephalexin suppression  Refill sent to pharmacy and she would like to try every other day dosing  Follow-up 6 months  2. Overactive bladder  She may have had incomplete emptying on oxybutynin though bladder scan PVRs have not indicated incomplete emptying  Trial  trospium 20 mg daily  3.  Urge incontinence  As above   Abbie Sons, MD  [facility] [address], [place], [place] [address] [phone]

## 2020-06-21 ENCOUNTER — Other Ambulatory Visit: Payer: Self-pay

## 2020-06-21 ENCOUNTER — Ambulatory Visit
Admission: RE | Admit: 2020-06-21 | Discharge: 2020-06-21 | Disposition: A | Discharge status: Home / Self Care | Payer: Medicare Other | Source: Ambulatory Visit | Attending: Internal Medicine | Admitting: Internal Medicine

## 2020-06-21 DIAGNOSIS — Z1231 Encounter for screening mammogram for malignant neoplasm of breast: Secondary | ICD-10-CM | POA: Insufficient documentation

## 2020-06-28 ENCOUNTER — Other Ambulatory Visit: Payer: Self-pay | Admitting: Urology

## 2020-08-06 ENCOUNTER — Other Ambulatory Visit: Payer: Self-pay | Admitting: Urology

## 2020-10-25 ENCOUNTER — Other Ambulatory Visit: Payer: Self-pay | Admitting: Urology

## 2020-10-25 DIAGNOSIS — N390 Urinary tract infection, site not specified: Secondary | ICD-10-CM

## 2020-12-08 NOTE — Progress Notes (Deleted)
12/09/2020 12:00 PM   Felicia Hughes Aug 22, 1933 [id number]  Referring provider: Tracie Harrier, [place] [facility] [place],  [place] [address]  No chief complaint on file.   Urologic history: 1.  Recurrent UTI -On cephalexin suppression, low-dose vaginal estrogen   2.  Overactive bladder -On oxybutynin, Uribel -No improvement with Myrbetriq   HPI: 87 y.o. female presents for semiannual follow-up.  At last visit March 2022 had discontinued oxybutynin and noted increased frequency and urgency Given a trial of trospium 20 mg daily Remains on Keflex suppression for recurrent UTI  PMH: Past Medical History:  Diagnosis Date   Arrhythmia    Chronic kidney disease    UTI   GERD (gastroesophageal reflux disease)    Glaucoma (increased eye pressure)    Hypercholesterolemia    Hypertension     Surgical History: Past Surgical History:  Procedure Laterality Date   ABDOMINAL HYSTERECTOMY     BREAST BIOPSY Right 06/11/2015   Procedure: BREAST BIOPSY WITH NEEDLE LOCALIZATION;  Surgeon: Leonie Green, MD;  Location: [facility] ORS;  Service: General;  Laterality: Right;   BREAST EXCISIONAL BIOPSY Right 06/11/2015   papilloma   BREAST SURGERY Right    Breast Needle Biopsy   CHOLECYSTECTOMY     DILATION AND CURETTAGE OF UTERUS     EYE SURGERY Bilateral    Cataract Extraction with IOL   LAPAROSCOPIC OOPHORECTOMY      Home Medications:  Allergies as of 12/09/2020       Reactions   Nitrofurantoin Hives   Sulfa Antibiotics Rash        Medication List        Accurate as of December 08, 2020 12:00 PM. If you have any questions, ask your nurse or doctor.          aspirin EC 81 MG tablet Take 81 mg by mouth daily.   atorvastatin 20 MG tablet Commonly known as: LIPITOR Take 20 mg by mouth daily at 6 PM.   Centrum Silver 50+Women Tabs Take by mouth.   cephALEXin 500 MG capsule Commonly known as: KEFLEX TAKE 1 CAPSULE (500 MG TOTAL)  BY MOUTH DAILY.   cholecalciferol 1000 units tablet Commonly known as: VITAMIN D Take 1,000 Units by mouth daily.   estradiol 0.1 MG/GM vaginal cream Commonly known as: ESTRACE VAGINAL Apply 0.'5mg'$  (pea-sized amount)  just inside the vaginal introitus with a finger-tip every night for two weeks and then Monday, Wednesday and Friday nights.   losartan 25 MG tablet Commonly known as: COZAAR Take 25 mg by mouth daily.   trospium 20 MG tablet Commonly known as: SANCTURA Take 1 tablet (20 mg total) by mouth daily.   zolpidem 10 MG tablet Commonly known as: AMBIEN Take 10 mg by mouth at bedtime as needed for sleep.        Allergies:  Allergies  Allergen Reactions   Nitrofurantoin Hives   Sulfa Antibiotics Rash    Family History: Family History  Problem Relation Age of Onset   Breast cancer Mother 39       early 65's   Kidney cancer Neg Hx    Kidney disease Neg Hx    Prostate cancer Neg Hx    Bladder Cancer Neg Hx     Social History:  reports that she has never smoked. She has never used smokeless tobacco. She reports current alcohol use of about 1.0 standard drink per week. She reports that she does not use drugs.   Physical  Exam: There were no vitals taken for this visit.  Constitutional:  Alert and oriented, No acute distress. HEENT: [place] AT, moist mucus membranes.  Trachea midline, no masses. Cardiovascular: No clubbing, cyanosis, or edema. Respiratory: Normal respiratory effort, no increased work of breathing. GI: Abdomen is soft, nontender, nondistended, no abdominal masses GU: No CVA tenderness Lymph: No cervical or inguinal lymphadenopathy. Skin: No rashes, bruises or suspicious lesions. Neurologic: Grossly intact, no focal deficits, moving all 4 extremities. Psychiatric: Normal mood and affect.  Laboratory Data: No results found for: WBC, HGB, HCT, MCV, PLT  Lab Results  Component Value Date   CREATININE 0.80 03/06/2019    No results found for:  PSA  No results found for: TESTOSTERONE  No results found for: HGBA1C  Urinalysis    Component Value Date/Time   COLORURINE AMBER (A) 06/26/2017 1802   APPEARANCEUR Clear 02/07/2019 1556   LABSPEC 1.016 06/26/2017 1802   PHURINE 5.0 06/26/2017 1802   GLUCOSEU Negative 02/07/2019 1556   HGBUR MODERATE (A) 06/26/2017 1802   BILIRUBINUR Negative 02/07/2019 1556   KETONESUR 5 (A) 06/26/2017 1802   PROTEINUR Negative 02/07/2019 1556   PROTEINUR 100 (A) 06/26/2017 1802   NITRITE Negative 02/07/2019 1556   NITRITE NEGATIVE 06/26/2017 1802   LEUKOCYTESUR Negative 02/07/2019 1556    Lab Results  Component Value Date   LABMICR See below: 02/07/2019   WBCUA None seen 02/07/2019   RBCUA 0-2 05/24/2018   LABEPIT None seen 02/07/2019   MUCUS Present (A) 01/25/2018   BACTERIA None seen 02/07/2019    Pertinent Imaging: *** No results found for this or any previous visit.  No results found for this or any previous visit.  No results found for this or any previous visit.  No results found for this or any previous visit.  No results found for this or any previous visit.  No results found for this or any previous visit.  Results for orders placed in visit on 02/07/19  CT HEMATURIA WORKUP  Narrative CLINICAL DATA:  Gross hematuria.  EXAM: CT ABDOMEN AND PELVIS WITHOUT AND WITH CONTRAST  TECHNIQUE: Multidetector CT imaging of the abdomen and pelvis was performed following the standard protocol before and following the bolus administration of intravenous contrast.  CONTRAST:  190m OMNIPAQUE IOHEXOL 300 MG/ML  SOLN  COMPARISON:  CT scan 05/25/2016  FINDINGS: Lower chest: Stable basilar scarring changes. No infiltrates or effusions. The heart is normal in size. Moderate distal aortic calcifications. The distal esophagus is grossly normal.  Hepatobiliary: No focal hepatic lesions or intrahepatic biliary dilatation. Gallbladder is surgically absent. No common bile  duct dilatation.  Pancreas: No mass, inflammation or ductal dilatation. Moderate pancreatic atrophy.  Spleen: Normal size.  No focal lesions.  Adrenals/Urinary Tract: No adrenal gland lesions are identified.  No renal, ureteral or bladder calculi or mass is identified without contrast.  Stomach/Bowel: The stomach, duodenum, small bowel and colon are grossly normal without oral contrast. No acute inflammatory changes, mass lesions or obstructive findings.  Vascular/Lymphatic: Scattered atherosclerotic calcifications involving the abdominal aorta and iliac arteries. No aneurysm.  Reproductive: Surgically absent.  Other: Small right inguinal hernia containing fat. No anterior abdominal wall hernia is or subcutaneous lesions.  Musculoskeletal: No significant bony findings.  IMPRESSION: 1. No CT findings to account for the patient's hematuria. No renal, ureteral or bladder calculi or obvious mass without contrast. 2. No acute abdominal/pelvic findings, mass lesions or adenopathy. 3. Status post cholecystectomy.  No biliary dilatation.   Electronically Signed By: PMamie Nick  Gallerani M.D. On: 03/06/2019 14:26  No results found for this or any previous visit.   Assessment & Plan:    There are no diagnoses linked to this encounter.  No follow-ups on file.  Abbie Sons, [place] [address] [place], [place] [address] [phone]

## 2020-12-09 ENCOUNTER — Ambulatory Visit: Payer: Self-pay | Admitting: Urology

## 2020-12-12 ENCOUNTER — Encounter: Payer: Self-pay | Admitting: Urology

## 2021-01-03 ENCOUNTER — Encounter: Payer: Self-pay | Admitting: Urology

## 2021-01-03 ENCOUNTER — Other Ambulatory Visit: Payer: Self-pay

## 2021-01-03 ENCOUNTER — Ambulatory Visit: Payer: Medicare Other | Admitting: Urology

## 2021-01-03 VITALS — BP 131/77 | HR 93 | Ht 60.0 in | Wt 95.0 lb

## 2021-01-03 DIAGNOSIS — N3941 Urge incontinence: Secondary | ICD-10-CM | POA: Diagnosis not present

## 2021-01-03 DIAGNOSIS — N390 Urinary tract infection, site not specified: Secondary | ICD-10-CM | POA: Diagnosis not present

## 2021-01-03 DIAGNOSIS — N3281 Overactive bladder: Secondary | ICD-10-CM | POA: Diagnosis not present

## 2021-01-03 LAB — BLADDER SCAN AMB NON-IMAGING: Scan Result: 17

## 2021-01-03 MED ORDER — GEMTESA 75 MG PO TABS: 75.0000 mg | ORAL_TABLET | Freq: Every day | ORAL | 0 refills | Status: DC

## 2021-01-03 NOTE — Progress Notes (Signed)
01/03/2021 11:58 AM   Felicia Hughes 02-18-1934 [id number]  Referring provider: Tracie Harrier, MD [address] [facility] [place],  [place] [address]  Chief Complaint  Patient presents with   Other   Urologic history: 1.  Recurrent UTI -On cephalexin suppression, low-dose vaginal estrogen   2.  Overactive bladder -Had been on oxybutynin but discontinued due to elevated residual -No improvement with Myrbetriq -Trial IR trospium 05/2020   HPI: 87 y.o. female presents for 6 month follow-up visit.  Has done well on cephalexin suppression and no recurrent UTIs since her last visit Her only complaint is urinary frequency and urgency Did not see improvement on trospium Bladder scan PVR today 17 mL   PMH: Past Medical History:  Diagnosis Date   Arrhythmia    Chronic kidney disease    UTI   GERD (gastroesophageal reflux disease)    Glaucoma (increased eye pressure)    Hypercholesterolemia    Hypertension     Surgical History: Past Surgical History:  Procedure Laterality Date   ABDOMINAL HYSTERECTOMY     BREAST BIOPSY Right 06/11/2015   Procedure: BREAST BIOPSY WITH NEEDLE LOCALIZATION;  Surgeon: Leonie Green, MD;  Location: [facility] ORS;  Service: General;  Laterality: Right;   BREAST EXCISIONAL BIOPSY Right 06/11/2015   papilloma   BREAST SURGERY Right    Breast Needle Biopsy   CHOLECYSTECTOMY     DILATION AND CURETTAGE OF UTERUS     EYE SURGERY Bilateral    Cataract Extraction with IOL   LAPAROSCOPIC OOPHORECTOMY      Home Medications:  Allergies as of 01/03/2021       Reactions   Nitrofurantoin Hives   Sulfa Antibiotics Rash        Medication List        Accurate as of January 03, 2021 11:58 AM. If you have any questions, ask your nurse or doctor.          aspirin EC 81 MG tablet Take 81 mg by mouth daily.   atorvastatin 20 MG tablet Commonly known as: LIPITOR Take 20 mg by mouth daily at 6 PM.   Centrum Silver  50+Women Tabs Take by mouth.   cephALEXin 500 MG capsule Commonly known as: KEFLEX TAKE 1 CAPSULE (500 MG TOTAL) BY MOUTH DAILY.   cholecalciferol 1000 units tablet Commonly known as: VITAMIN D Take 1,000 Units by mouth daily.   estradiol 0.1 MG/GM vaginal cream Commonly known as: ESTRACE VAGINAL Apply 0.5mg  (pea-sized amount)  just inside the vaginal introitus with a finger-tip every night for two weeks and then Monday, Wednesday and Friday nights.   losartan 25 MG tablet Commonly known as: COZAAR Take 25 mg by mouth daily.   trospium 20 MG tablet Commonly known as: SANCTURA Take 1 tablet (20 mg total) by mouth daily.   zolpidem 10 MG tablet Commonly known as: AMBIEN Take 10 mg by mouth at bedtime as needed for sleep.        Allergies:  Allergies  Allergen Reactions   Nitrofurantoin Hives   Sulfa Antibiotics Rash    Family History: Family History  Problem Relation Age of Onset   Breast cancer Mother 75       early 44's   Kidney cancer Neg Hx    Kidney disease Neg Hx    Prostate cancer Neg Hx    Bladder Cancer Neg Hx     Social History:  reports that she has never smoked. She has never used smokeless tobacco.  She reports current alcohol use of about 1.0 standard drink per week. She reports that she does not use drugs.   Physical Exam: BP 131/77   Pulse 93   Ht 5' (1.524 m)   Wt 95 lb (43.1 kg)   BMI 18.55 kg/m   Constitutional:  Alert and oriented, No acute distress. HEENT: [place] AT, moist mucus membranes.  Trachea midline, no masses. Cardiovascular: No clubbing, cyanosis, or edema. Respiratory: Normal respiratory effort, no increased work of breathing. Psychiatric: Normal mood and affect.   Assessment & Plan:    1. Overactive bladder Symptoms are bothersome We discussed the availability of Gemtesa however if effective would most likely be cost prohibitive. She does desire a trial and was given samples x4 weeks and will call back regarding  efficacy No improvement on trospium and would not recommend oxybutynin based on age  87.  Recurrent UTI Stable on low-dose antibiotic suppression   1 year follow-up or earlier for any worsening symptoms    Abbie Sons, MD  [facility] [address], [place], [place] [address] [phone]

## 2021-04-22 ENCOUNTER — Other Ambulatory Visit: Payer: Self-pay | Admitting: Urology

## 2021-04-22 DIAGNOSIS — N390 Urinary tract infection, site not specified: Secondary | ICD-10-CM

## 2021-06-24 ENCOUNTER — Other Ambulatory Visit: Payer: Self-pay | Admitting: Internal Medicine

## 2021-06-24 DIAGNOSIS — Z1231 Encounter for screening mammogram for malignant neoplasm of breast: Secondary | ICD-10-CM

## 2021-06-25 ENCOUNTER — Ambulatory Visit
Admission: RE | Admit: 2021-06-25 | Discharge: 2021-06-25 | Disposition: A | Discharge status: Home / Self Care | Payer: Medicare Other | Source: Ambulatory Visit | Attending: Internal Medicine | Admitting: Internal Medicine

## 2021-06-25 DIAGNOSIS — Z1231 Encounter for screening mammogram for malignant neoplasm of breast: Secondary | ICD-10-CM

## 2021-09-10 ENCOUNTER — Emergency Department: Payer: Medicare Other

## 2021-09-10 ENCOUNTER — Other Ambulatory Visit: Payer: Self-pay

## 2021-09-10 ENCOUNTER — Emergency Department
Admission: EM | Admit: 2021-09-10 | Discharge: 2021-09-10 | Disposition: A | Discharge status: Home / Self Care | Payer: Medicare Other | Attending: Emergency Medicine | Admitting: Emergency Medicine

## 2021-09-10 DIAGNOSIS — S7002XA Contusion of left hip, initial encounter: Secondary | ICD-10-CM | POA: Insufficient documentation

## 2021-09-10 DIAGNOSIS — I10 Essential (primary) hypertension: Secondary | ICD-10-CM | POA: Diagnosis not present

## 2021-09-10 DIAGNOSIS — S0083XA Contusion of other part of head, initial encounter: Secondary | ICD-10-CM | POA: Insufficient documentation

## 2021-09-10 DIAGNOSIS — W182XXA Fall in (into) shower or empty bathtub, initial encounter: Secondary | ICD-10-CM | POA: Insufficient documentation

## 2021-09-10 DIAGNOSIS — S0993XA Unspecified injury of face, initial encounter: Secondary | ICD-10-CM | POA: Diagnosis present

## 2021-09-10 DIAGNOSIS — W19XXXA Unspecified fall, initial encounter: Secondary | ICD-10-CM

## 2021-09-10 MED ORDER — LIDOCAINE 5 % EX PTCH
1.0000 | MEDICATED_PATCH | Freq: Two times a day (BID) | CUTANEOUS | 0 refills | Status: DC
Start: 2021-09-10 — End: 2022-07-30

## 2021-09-10 MED ORDER — LIDOCAINE 5 % EX PTCH
1.0000 | MEDICATED_PATCH | Freq: Once | CUTANEOUS | Status: DC
  Administered 2021-09-10: 1 via TRANSDERMAL
  Filled 2021-09-10: qty 1

## 2021-09-10 NOTE — ED Triage Notes (Signed)
Per EMS pt had fall. Tripped over a rug. Left hip pain. Left back lac. No deformity per EMS. AxOx4. Lives with partner.  Pt fell and hit her head on the shower door as she fell.   HOH

## 2021-09-10 NOTE — ED Notes (Signed)
Patient ambulatory to the restroom.

## 2021-09-10 NOTE — ED Notes (Signed)
Patient given discharge instructions, all questions answered. Patient in possession of all belongings, directed to the discharge area  

## 2021-09-10 NOTE — ED Provider Notes (Signed)
[facility] Provider Note    Event Date/Time   First MD Initiated Contact with Patient 09/10/21 1122     (approximate)   History   Chief Complaint Fall (hip) and Hip Pain (left)   HPI  Felicia Hughes is a 88 y.o. female with past medical history of hypertension and hyperlipidemia who presents to the ED complaining of fall.  Patient reports that earlier this morning she tripped and fell trying to get into her shower.  She states that she hit her head on the shower door in the lower part of her back on the step to get into the shower.  She denies losing consciousness and does not take any blood thinners.  She did notice a small amount of bleeding from her lower back area and has had pain in her lower back moving into her left hip since the fall.  She has been able to take a few steps, but felt dizzy doing so and so partner called EMS.  She denies any pain in her chest, abdomen, or upper extremities.     Physical Exam   Triage Vital Signs: ED Triage Vitals  Enc Vitals Group     BP 09/10/21 1122 133/63     Pulse Rate 09/10/21 1122 93     Resp 09/10/21 1122 18     Temp 09/10/21 1122 98.3 F (36.8 C)     Temp Source 09/10/21 1122 Oral     SpO2 09/10/21 1122 97 %     Weight 09/10/21 1121 95 lb (43.1 kg)     Height 09/10/21 1121 5' (1.524 m)     Head Circumference --      Peak Flow --      Pain Score 09/10/21 1120 6     Pain Loc --      Pain Edu? --      Excl. in [place]? --     Most recent vital signs: Vitals:   09/10/21 1122  BP: 133/63  Pulse: 93  Resp: 18  Temp: 98.3 F (36.8 C)  SpO2: 97%    Constitutional: Alert and oriented. Eyes: Conjunctivae are normal. Head: Small amount of ecchymosis over right eye with no hematoma or bony tenderness. Nose: No congestion/rhinnorhea. Mouth/Throat: Mucous membranes are moist.  Neck: No midline cervical spine tenderness to palpation. Cardiovascular: Normal rate, regular rhythm. Grossly normal heart sounds.   2+ radial pulses bilaterally. Respiratory: Normal respiratory effort.  No retractions. Lungs CTAB.  No chest wall tenderness to palpation. Gastrointestinal: Soft and nontender. No distention. Musculoskeletal: Tenderness to palpation noted at left hip diffusely with range of motion intact.  No tenderness to palpation noted at right hip, bilateral knees, or bilateral ankles.  Tenderness to palpation noted over midline lower lumbar spine with associated ecchymosis and edema. Neurologic:  Normal speech and language. No gross focal neurologic deficits are appreciated.    ED Results / Procedures / Treatments   Labs (all labs ordered are listed, but only abnormal results are displayed) Labs Reviewed - No data to display  RADIOLOGY X-rays of left hip reviewed and interpreted by me with subtle irregularity at the left femoral neck, no obvious fracture noted.  PROCEDURES:  Critical Care performed: No  Procedures   MEDICATIONS ORDERED IN ED: Medications  lidocaine (LIDODERM) 5 % 1 patch (1 patch Transdermal Patch Applied 09/10/21 1216)     IMPRESSION / MDM / ASSESSMENT AND PLAN / ED COURSE  I reviewed the triage vital signs and the nursing  notes.                              88 y.o. female with past medical history of hypertension and hyperlipidemia who presents to the ED complaining of fall while getting into her shower, striking her head and left hip with no LOC.  Patient's presentation is most consistent with acute complicated illness / injury requiring diagnostic workup.  Differential diagnosis includes, but is not limited to, intracranial injury, cervical spine injury, hip fracture, hip dislocation, lumbar spinal fracture.  Patient well-appearing and in no acute distress, vital signs are unremarkable and she has no focal neurologic deficits on exam.  Given her head trauma and advanced age, we will check CT head and cervical spine.  X-rays of left hip show possible irregularity at  the left femoral neck with no obvious fracture, we will further assess with CT scan of her left hip as well as her lumbar spine.  Patient declines pain medication beyond Lidoderm patch.  CT head and cervical spine are negative for acute process, CT of lumbar spine and hip also show no signs of acute traumatic injury. Low suspicion for occult hip fracture are patient has been able to range her hip and bear weight. She is appropriate for discharge home with PCP follow-up, was counseled to return to the ED for new or worsening symptoms.      FINAL CLINICAL IMPRESSION(S) / ED DIAGNOSES   Final diagnoses:  Fall, initial encounter  Contusion of face, initial encounter  Contusion of left hip, initial encounter     Rx / DC Orders   ED Discharge Orders          Ordered    lidocaine (LIDODERM) 5 %  Every 12 hours        09/10/21 1415             Note:  This document was prepared using Dragon voice recognition software and may include unintentional dictation errors.   Blake Divine, MD 09/10/21 [phone]

## 2021-10-02 ENCOUNTER — Other Ambulatory Visit: Payer: Self-pay | Admitting: Internal Medicine

## 2021-10-02 DIAGNOSIS — M545 Low back pain, unspecified: Secondary | ICD-10-CM

## 2021-10-02 DIAGNOSIS — G8911 Acute pain due to trauma: Secondary | ICD-10-CM

## 2021-10-02 DIAGNOSIS — Z9181 History of falling: Secondary | ICD-10-CM

## 2021-10-12 ENCOUNTER — Ambulatory Visit
Admission: RE | Admit: 2021-10-12 | Discharge: 2021-10-12 | Disposition: A | Discharge status: Home / Self Care | Payer: Medicare Other | Source: Ambulatory Visit | Attending: Internal Medicine | Admitting: Internal Medicine

## 2021-10-12 DIAGNOSIS — G8911 Acute pain due to trauma: Secondary | ICD-10-CM | POA: Diagnosis present

## 2021-10-12 DIAGNOSIS — M545 Low back pain, unspecified: Secondary | ICD-10-CM | POA: Insufficient documentation

## 2021-10-12 DIAGNOSIS — Z9181 History of falling: Secondary | ICD-10-CM | POA: Insufficient documentation

## 2021-10-12 MED ORDER — GADOBUTROL 1 MMOL/ML IV SOLN
5.0000 mL | Freq: Once | INTRAVENOUS | Status: AC | PRN
  Administered 2021-10-12: 7.5 mL via INTRAVENOUS

## 2022-01-12 ENCOUNTER — Ambulatory Visit: Payer: Medicare Other | Admitting: Urology

## 2022-01-14 ENCOUNTER — Ambulatory Visit: Payer: Medicare Other | Admitting: Urology

## 2022-01-14 ENCOUNTER — Encounter: Payer: Self-pay | Admitting: Urology

## 2022-07-30 ENCOUNTER — Encounter: Payer: Self-pay | Admitting: Urology

## 2022-07-30 ENCOUNTER — Ambulatory Visit: Payer: Medicare Other | Admitting: Urology

## 2022-07-30 VITALS — BP 118/74 | HR 111 | Ht 60.0 in | Wt 100.0 lb

## 2022-07-30 DIAGNOSIS — R39198 Other difficulties with micturition: Secondary | ICD-10-CM

## 2022-07-30 DIAGNOSIS — N3281 Overactive bladder: Secondary | ICD-10-CM | POA: Diagnosis not present

## 2022-07-30 DIAGNOSIS — R3915 Urgency of urination: Secondary | ICD-10-CM

## 2022-07-30 DIAGNOSIS — R350 Frequency of micturition: Secondary | ICD-10-CM | POA: Diagnosis not present

## 2022-07-30 DIAGNOSIS — N390 Urinary tract infection, site not specified: Secondary | ICD-10-CM

## 2022-07-30 LAB — BLADDER SCAN AMB NON-IMAGING: Scan Result: 1

## 2022-07-30 MED ORDER — GEMTESA 75 MG PO TABS: 75.0000 mg | ORAL_TABLET | Freq: Every day | ORAL | 0 refills | Status: AC

## 2022-07-30 MED ORDER — GEMTESA 75 MG PO TABS: 75.0000 mg | ORAL_TABLET | Freq: Every day | ORAL | 0 refills | Status: DC

## 2022-07-30 NOTE — Progress Notes (Signed)
Marcelle Overlie Plume,acting as a scribe for Riki Altes, MD.,have documented all relevant documentation on the behalf of Riki Altes, MD,as directed by  Riki Altes, MD while in the presence of Riki Altes, MD.  07/30/2022 3:37 PM   Felicia Hughes [birth year] [id number]  Referring provider: Barbette Reichmann, MD [address] [facility] [place],  [place] [address]  Chief Complaint  Patient presents with   Urinary Frequency   Urologic history: 1.  Recurrent UTI -Previously on cephalexin suppression, low-dose vaginal estrogen   2.  Overactive bladder -Had been on oxybutynin but discontinued due to elevated residual -No improvement with Myrbetriq -Trial IR trospium 05/2020  HPI: Felicia Mccalman is a 89 y.o. female who presents for an acute visit for possible UTI  Last seen here on 01/03/2021 and was doing well with low dose antibiotic prophylaxis and low dose vaginal estrogen She stopped taking her low dose antibiotic prophylaxis several months ago and has been symptom free until recently Complains of increased frequency, urgency, and mild discomfort with urination   PMH: Past Medical History:  Diagnosis Date   Arrhythmia    Chronic kidney disease    UTI   GERD (gastroesophageal reflux disease)    Glaucoma (increased eye pressure)    Hypercholesterolemia    Hypertension     Surgical History: Past Surgical History:  Procedure Laterality Date   ABDOMINAL HYSTERECTOMY     BREAST BIOPSY Right 06/11/2015   Procedure: BREAST BIOPSY WITH NEEDLE LOCALIZATION;  Surgeon: Nadeen Landau, MD;  Location: [facility] ORS;  Service: General;  Laterality: Right;   BREAST EXCISIONAL BIOPSY Right 06/11/2015   papilloma   BREAST SURGERY Right    Breast Needle Biopsy   CHOLECYSTECTOMY     DILATION AND CURETTAGE OF UTERUS     EYE SURGERY Bilateral    Cataract Extraction with IOL   LAPAROSCOPIC OOPHORECTOMY      Home Medications:  Allergies as of 07/30/2022        Reactions   Nitrofurantoin Hives   Sulfa Antibiotics Rash        Medication List        Accurate as of Jul 30, 2022  3:37 PM. If you have any questions, ask your nurse or doctor.          STOP taking these medications    aspirin EC 81 MG tablet Stopped by: Riki Altes, MD   cephALEXin 500 MG capsule Commonly known as: KEFLEX Stopped by: Riki Altes, MD   estradiol 0.1 MG/GM vaginal cream Commonly known as: ESTRACE VAGINAL Stopped by: Riki Altes, MD   Gemtesa 75 MG Tabs Generic drug: Vibegron Stopped by: Riki Altes, MD   lidocaine 5 % Commonly known as: Lidoderm Stopped by: Riki Altes, MD       TAKE these medications    atorvastatin 20 MG tablet Commonly known as: LIPITOR Take 20 mg by mouth daily at 6 PM.   Centrum Silver 50+Women Tabs Take by mouth.   cholecalciferol 1000 units tablet Commonly known as: VITAMIN D Take 1,000 Units by mouth daily.   losartan 25 MG tablet Commonly known as: COZAAR Take 25 mg by mouth daily.   zolpidem 10 MG tablet Commonly known as: AMBIEN Take 10 mg by mouth at bedtime as needed for sleep.        Allergies:  Allergies  Allergen Reactions   Nitrofurantoin Hives   Sulfa Antibiotics Rash    Family  History: Family History  Problem Relation Age of Onset   Breast cancer Mother 28       early 37's   Kidney cancer Neg Hx    Kidney disease Neg Hx    Prostate cancer Neg Hx    Bladder Cancer Neg Hx     Social History:  reports that she has never smoked. She has never used smokeless tobacco. She reports current alcohol use of about 1.0 standard drink of alcohol per week. She reports that she does not use drugs.   Physical Exam: BP 118/74   Pulse (!) 111   Ht 5' (1.524 m)   Wt 100 lb (45.4 kg)   BMI 19.53 kg/m   Constitutional:  Alert and oriented, No acute distress. HEENT: [place] AT, moist mucus membranes.  Trachea midline, no masses. Cardiovascular: No clubbing, cyanosis, or  edema. Respiratory: Normal respiratory effort, no increased work of breathing. GI: Abdomen is soft, nontender, nondistended, no abdominal masses Skin: No rashes, bruises or suspicious lesions. Neurologic: Grossly intact, no focal deficits, moving all 4 extremities. Psychiatric: Normal mood and affect.  Laboratory Data:  She was unable to provide a urine specimen today.  Bladder scan volume was 1 cc.   Assessment & Plan:    1. Recurrent UTI Bladder scan volume was 1 cc She will bring a urine specimen tomorrow  2. Overactive bladder Restart gemtesa 75 mg daily- samples given  I have reviewed the above documentation for accuracy and completeness, and I agree with the above.   Riki Altes, MD  [facility] [address] [place], [place] [address] [phone]

## 2022-07-31 ENCOUNTER — Other Ambulatory Visit: Payer: Medicare Other

## 2022-08-03 ENCOUNTER — Ambulatory Visit: Payer: Medicare Other | Admitting: Urology

## 2022-08-07 ENCOUNTER — Emergency Department
Admission: EM | Admit: 2022-08-07 | Discharge: 2022-08-07 | Discharge status: Against Medical Advice | Payer: Medicare Other | Attending: Emergency Medicine | Admitting: Emergency Medicine

## 2022-08-07 ENCOUNTER — Other Ambulatory Visit: Payer: Self-pay

## 2022-08-07 DIAGNOSIS — Z5321 Procedure and treatment not carried out due to patient leaving prior to being seen by health care provider: Secondary | ICD-10-CM | POA: Insufficient documentation

## 2022-08-07 DIAGNOSIS — R1031 Right lower quadrant pain: Secondary | ICD-10-CM | POA: Diagnosis present

## 2022-08-07 LAB — COMPREHENSIVE METABOLIC PANEL
ALT: 17 U/L (ref 0–44)
AST: 22 U/L (ref 15–41)
Albumin: 4.1 g/dL (ref 3.5–5.0)
Alkaline Phosphatase: 65 U/L (ref 38–126)
Anion gap: 10 (ref 5–15)
BUN: 21 mg/dL (ref 8–23)
CO2: 27 mmol/L (ref 22–32)
Calcium: 9.4 mg/dL (ref 8.9–10.3)
Chloride: 100 mmol/L (ref 98–111)
Creatinine, Ser: 0.84 mg/dL (ref 0.44–1.00)
GFR, Estimated: >60 mL/min (ref >60)
Glucose, Bld: 136 mg/dL — ABNORMAL HIGH (ref 70–99)
Potassium: 4.8 mmol/L (ref 3.5–5.1)
Sodium: 137 mmol/L (ref 135–145)
Total Bilirubin: 0.8 mg/dL (ref 0.3–1.2)
Total Protein: 6.5 g/dL (ref 6.5–8.1)

## 2022-08-07 LAB — CBC
HCT: 42.5 % (ref 36.0–46.0)
Hemoglobin: 13.8 g/dL (ref 12.0–15.0)
MCH: 32.9 pg (ref 26.0–34.0)
MCHC: 32.5 g/dL (ref 30.0–36.0)
MCV: 101.4 fL — ABNORMAL HIGH (ref 80.0–100.0)
Platelets: 394 10*3/uL (ref 150–400)
RBC: 4.19 MIL/uL (ref 3.87–5.11)
RDW: 11.9 % (ref 11.5–15.5)
WBC: 9.6 10*3/uL (ref 4.0–10.5)
nRBC: 0 % (ref 0.0–0.2)

## 2022-08-07 LAB — LIPASE, BLOOD: Lipase: 47 U/L (ref 11–51)

## 2022-08-07 NOTE — ED Triage Notes (Signed)
Pt presents to ED with c/o of RLQ pain that started today but does endorse pain has been intermittent for a few days. Pt denies fevers or chills. Pt states she has no appendix. Pt denies urinary s/s.   Pt states "my doctor old me it is a hernia".

## 2022-08-31 ENCOUNTER — Ambulatory Visit: Payer: Medicare Other | Admitting: Urology

## 2022-09-02 ENCOUNTER — Other Ambulatory Visit: Payer: Medicare Other

## 2022-09-03 ENCOUNTER — Inpatient Hospital Stay
Admission: EM | Admit: 2022-09-03 | Discharge: 2022-09-06 | DRG: 352 | Disposition: A | Discharge status: Nursing Home (Includes ICF) | Payer: Medicare Other | Source: Skilled Nursing Facility | Attending: Surgery | Admitting: Surgery

## 2022-09-03 ENCOUNTER — Other Ambulatory Visit: Payer: Self-pay

## 2022-09-03 ENCOUNTER — Emergency Department: Payer: Medicare Other

## 2022-09-03 ENCOUNTER — Encounter: Payer: Self-pay | Admitting: Emergency Medicine

## 2022-09-03 ENCOUNTER — Inpatient Hospital Stay: Admit: 2022-09-03 | Payer: Medicare Other | Admitting: Surgery

## 2022-09-03 DIAGNOSIS — I129 Hypertensive chronic kidney disease with stage 1 through stage 4 chronic kidney disease, or unspecified chronic kidney disease: Secondary | ICD-10-CM | POA: Diagnosis present

## 2022-09-03 DIAGNOSIS — N189 Chronic kidney disease, unspecified: Secondary | ICD-10-CM | POA: Diagnosis present

## 2022-09-03 DIAGNOSIS — Z882 Allergy status to sulfonamides status: Secondary | ICD-10-CM | POA: Diagnosis not present

## 2022-09-03 DIAGNOSIS — Z803 Family history of malignant neoplasm of breast: Secondary | ICD-10-CM

## 2022-09-03 DIAGNOSIS — K56609 Unspecified intestinal obstruction, unspecified as to partial versus complete obstruction: Principal | ICD-10-CM | POA: Diagnosis present

## 2022-09-03 DIAGNOSIS — E7800 Pure hypercholesterolemia, unspecified: Secondary | ICD-10-CM | POA: Diagnosis present

## 2022-09-03 DIAGNOSIS — K219 Gastro-esophageal reflux disease without esophagitis: Secondary | ICD-10-CM | POA: Diagnosis present

## 2022-09-03 DIAGNOSIS — H409 Unspecified glaucoma: Secondary | ICD-10-CM | POA: Diagnosis present

## 2022-09-03 DIAGNOSIS — K4030 Unilateral inguinal hernia, with obstruction, without gangrene, not specified as recurrent: Principal | ICD-10-CM | POA: Diagnosis present

## 2022-09-03 DIAGNOSIS — Z881 Allergy status to other antibiotic agents status: Secondary | ICD-10-CM

## 2022-09-03 DIAGNOSIS — R059 Cough, unspecified: Secondary | ICD-10-CM | POA: Diagnosis not present

## 2022-09-03 DIAGNOSIS — K460 Unspecified abdominal hernia with obstruction, without gangrene: Secondary | ICD-10-CM

## 2022-09-03 LAB — COMPREHENSIVE METABOLIC PANEL
ALT: 18 U/L (ref 0–44)
AST: 24 U/L (ref 15–41)
Albumin: 4.3 g/dL (ref 3.5–5.0)
Alkaline Phosphatase: 75 U/L (ref 38–126)
Anion gap: 10 (ref 5–15)
BUN: 25 mg/dL — ABNORMAL HIGH (ref 8–23)
CO2: 26 mmol/L (ref 22–32)
Calcium: 9.6 mg/dL (ref 8.9–10.3)
Chloride: 101 mmol/L (ref 98–111)
Creatinine, Ser: 0.89 mg/dL (ref 0.44–1.00)
GFR, Estimated: >60 mL/min (ref >60)
Glucose, Bld: 144 mg/dL — ABNORMAL HIGH (ref 70–99)
Potassium: 4.3 mmol/L (ref 3.5–5.1)
Sodium: 137 mmol/L (ref 135–145)
Total Bilirubin: 0.8 mg/dL (ref 0.3–1.2)
Total Protein: 6.7 g/dL (ref 6.5–8.1)

## 2022-09-03 LAB — CBC
HCT: 41.4 % (ref 36.0–46.0)
Hemoglobin: 13.5 g/dL (ref 12.0–15.0)
MCH: 32.4 pg (ref 26.0–34.0)
MCHC: 32.6 g/dL (ref 30.0–36.0)
MCV: 99.3 fL (ref 80.0–100.0)
Platelets: 436 10*3/uL — ABNORMAL HIGH (ref 150–400)
RBC: 4.17 MIL/uL (ref 3.87–5.11)
RDW: 11.9 % (ref 11.5–15.5)
WBC: 7.9 10*3/uL (ref 4.0–10.5)
nRBC: 0 % (ref 0.0–0.2)

## 2022-09-03 LAB — LIPASE, BLOOD: Lipase: 36 U/L (ref 11–51)

## 2022-09-03 MED ORDER — PIPERACILLIN-TAZOBACTAM 3.375 G IVPB: 3.3750 g | Freq: Three times a day (TID) | INTRAVENOUS | Status: DC

## 2022-09-03 MED ORDER — IOHEXOL 300 MG/ML  SOLN: 75.0000 mL | Freq: Once | INTRAMUSCULAR | Status: DC | PRN

## 2022-09-03 MED ORDER — HEPARIN SODIUM (PORCINE) 5000 UNIT/ML IJ SOLN
5000.0000 [IU] | Freq: Three times a day (TID) | INTRAMUSCULAR | Status: DC
  Administered 2022-09-04 – 2022-09-06 (×7): 5000 [IU] via SUBCUTANEOUS
  Filled 2022-09-03 (×7): qty 1

## 2022-09-03 MED ORDER — PIPERACILLIN-TAZOBACTAM 3.375 G IVPB 30 MIN
3.3750 g | Freq: Three times a day (TID) | INTRAVENOUS | Status: DC
  Administered 2022-09-03: 3.375 g via INTRAVENOUS
  Filled 2022-09-03 (×2): qty 50

## 2022-09-03 MED ORDER — ONDANSETRON HCL 4 MG/2ML IJ SOLN: 4.0000 mg | Freq: Four times a day (QID) | INTRAMUSCULAR | Status: DC | PRN

## 2022-09-03 MED ORDER — PROCHLORPERAZINE MALEATE 10 MG PO TABS: 10.0000 mg | ORAL_TABLET | Freq: Four times a day (QID) | ORAL | Status: DC | PRN

## 2022-09-03 MED ORDER — ONDANSETRON HCL 4 MG/2ML IJ SOLN
4.0000 mg | Freq: Once | INTRAMUSCULAR | Status: AC
  Administered 2022-09-03: 4 mg via INTRAVENOUS
  Filled 2022-09-03: qty 2

## 2022-09-03 MED ORDER — DIPHENHYDRAMINE HCL 12.5 MG/5ML PO ELIX: 12.5000 mg | ORAL_SOLUTION | Freq: Four times a day (QID) | ORAL | Status: DC | PRN

## 2022-09-03 MED ORDER — PANTOPRAZOLE SODIUM 40 MG IV SOLR
40.0000 mg | Freq: Two times a day (BID) | INTRAVENOUS | Status: DC
  Administered 2022-09-03 – 2022-09-05 (×3): 40 mg via INTRAVENOUS
  Filled 2022-09-03 (×5): qty 10

## 2022-09-03 MED ORDER — MORPHINE SULFATE (PF) 2 MG/ML IV SOLN
2.0000 mg | Freq: Once | INTRAVENOUS | Status: AC
  Administered 2022-09-03: 2 mg via INTRAVENOUS
  Filled 2022-09-03: qty 1

## 2022-09-03 MED ORDER — ONDANSETRON 4 MG PO TBDP: 4.0000 mg | ORAL_TABLET | Freq: Four times a day (QID) | ORAL | Status: DC | PRN

## 2022-09-03 MED ORDER — FENTANYL CITRATE PF 50 MCG/ML IJ SOSY
50.0000 ug | PREFILLED_SYRINGE | Freq: Once | INTRAMUSCULAR | Status: AC
  Administered 2022-09-03: 50 ug via INTRAVENOUS
  Filled 2022-09-03: qty 1

## 2022-09-03 MED ORDER — ACETAMINOPHEN 10 MG/ML IV SOLN
1000.0000 mg | Freq: Four times a day (QID) | INTRAVENOUS | Status: DC
  Administered 2022-09-04 (×2): 1000 mg via INTRAVENOUS
  Filled 2022-09-03 (×3): qty 100

## 2022-09-03 MED ORDER — PIPERACILLIN-TAZOBACTAM 3.375 G IVPB
3.3750 g | Freq: Three times a day (TID) | INTRAVENOUS | Status: DC
  Administered 2022-09-04: 3.375 g via INTRAVENOUS
  Filled 2022-09-03: qty 50

## 2022-09-03 MED ORDER — PROCHLORPERAZINE EDISYLATE 10 MG/2ML IJ SOLN: 5.0000 mg | Freq: Four times a day (QID) | INTRAMUSCULAR | Status: DC | PRN

## 2022-09-03 MED ORDER — MORPHINE SULFATE (PF) 2 MG/ML IV SOLN: 2.0000 mg | INTRAVENOUS | Status: DC | PRN

## 2022-09-03 MED ORDER — HYDRALAZINE HCL 20 MG/ML IJ SOLN: 10.0000 mg | INTRAMUSCULAR | Status: DC | PRN

## 2022-09-03 MED ORDER — SODIUM CHLORIDE 0.9 % IV BOLUS
1000.0000 mL | Freq: Once | INTRAVENOUS | Status: AC
  Administered 2022-09-03: 1000 mL via INTRAVENOUS

## 2022-09-03 MED ORDER — MORPHINE SULFATE (PF) 4 MG/ML IV SOLN
4.0000 mg | INTRAVENOUS | Status: DC | PRN
  Administered 2022-09-03 – 2022-09-04 (×2): 4 mg via INTRAVENOUS
  Filled 2022-09-03 (×2): qty 1

## 2022-09-03 MED ORDER — KETOROLAC TROMETHAMINE 15 MG/ML IJ SOLN: 15.0000 mg | Freq: Four times a day (QID) | INTRAMUSCULAR | Status: DC | PRN

## 2022-09-03 MED ORDER — IOHEXOL 9 MG/ML PO SOLN
1000.0000 mL | ORAL | Status: AC
  Administered 2022-09-03: 1000 mL via ORAL
  Filled 2022-09-03: qty 1000

## 2022-09-03 MED ORDER — IOHEXOL 300 MG/ML  SOLN
100.0000 mL | Freq: Once | INTRAMUSCULAR | Status: AC | PRN
  Administered 2022-09-03: 100 mL via INTRAVENOUS

## 2022-09-03 MED ORDER — DIPHENHYDRAMINE HCL 50 MG/ML IJ SOLN
12.5000 mg | Freq: Four times a day (QID) | INTRAMUSCULAR | Status: DC | PRN
  Administered 2022-09-03: 12.5 mg via INTRAVENOUS
  Filled 2022-09-03: qty 1

## 2022-09-03 MED ORDER — SODIUM CHLORIDE 0.9 % IV SOLN: Freq: Once | INTRAVENOUS | Status: AC

## 2022-09-03 MED ORDER — SODIUM CHLORIDE 0.9 % IV SOLN: INTRAVENOUS | Status: DC

## 2022-09-03 NOTE — ED Notes (Signed)
Three attempts to start the NG tube without success. Pt stated she was not able to tolerate any further attempts. Epistaxis noted in left nostril after final attempt. Bleeding controlled with direct pressure and hemostasis achieved. ED provider notified and received verbal orders discontinue NG tube.

## 2022-09-03 NOTE — ED Notes (Signed)
See triage note  Presents with abd pain  States she has been told that she had a hernia  States pain became worse about 1 pm

## 2022-09-03 NOTE — ED Provider Notes (Signed)
[facility] Provider Note    Event Date/Time   First MD Initiated Contact with Patient 09/03/22 1642     (approximate)  History   Chief Complaint: Abdominal Pain  HPI  Felicia Hughes is a 89 y.o. female with a past medical history of CKD, gastric reflux, hypertension, hyperlipidemia, known right inguinal hernia who presents to the emergency department for worsening pain in her right inguinal hernia.  According to the patient over the last few hours she has noticed increased pain and distention of her right inguinal hernia.  States she spoke to the surgery office who she has been following up with and they recommended she come to the emergency department for evaluation.  Patient states normal bowel movement this morning.  Denies any vomiting.  Does state moderate pain to this area.  Physical Exam   Triage Vital Signs: ED Triage Vitals  Enc Vitals Group     BP 09/03/22 1616 127/79     Pulse Rate 09/03/22 1616 94     Resp 09/03/22 1616 16     Temp 09/03/22 1616 98.3 F (36.8 C)     Temp src --      SpO2 09/03/22 1616 96 %     Weight 09/03/22 1617 100 lb (45.4 kg)     Height 09/03/22 1617 5' (1.524 m)     Head Circumference --      Peak Flow --      Pain Score 09/03/22 1616 5     Pain Loc --      Pain Edu? --      Excl. in [place]? --     Most recent vital signs: Vitals:   09/03/22 1616  BP: 127/79  Pulse: 94  Resp: 16  Temp: 98.3 F (36.8 C)  SpO2: 96%    General: Awake, no distress.  CV:  Good peripheral perfusion.  Regular rate and rhythm  Resp:  Normal effort.  Equal breath sounds bilaterally.  Abd:  No distention.  Soft, Moderate tenderness over the right inguinal area where she has fullness consistent with a hernia.  Unable to reduce at bedside.  ED Results / Procedures / Treatments   RADIOLOGY  I have reviewed and interpreted CT images.  Patient appears to have a bowel obstruction due to right inguinal hernia on my evaluation. Radiology  confirms high-grade small bowel obstruction.   MEDICATIONS ORDERED IN ED: Medications  sodium chloride 0.9 % bolus 1,000 mL (has no administration in time range)  ondansetron (ZOFRAN) injection 4 mg (has no administration in time range)  fentaNYL (SUBLIMAZE) injection 50 mcg (has no administration in time range)  iohexol (OMNIPAQUE) 300 MG/ML solution 75 mL (has no administration in time range)     IMPRESSION / MDM / ASSESSMENT AND PLAN / ED COURSE  I reviewed the triage vital signs and the nursing notes.  Patient's presentation is most consistent with acute presentation with potential threat to life or bodily function.  Patient presents to the emergency department for right inguinal pain and tenderness and distention worsening over the last few hours.  Clinical exam is most consistent with an incarcerated inguinal hernia.  Attempted bedside reduction unsuccessful.  Patient's lab work is reassuring with a normal CBC reassuring chemistry including normal LFTs and lipase.  We will obtain a CT scan abdomen/pelvis to further evaluate.  Will treat pain nausea and IV hydrate while awaiting CT imaging.  Patient agreeable to plan of care.  Patient's lab work is reassuring with  a normal CBC reassuring chemistry and negative lipase.  Patient CT scan confirms high-grade bowel obstruction due to right inguinal hernia.  Unable to reduce in the emergency department.  Discussed with general surgery they recommend starting patient on IV Zosyn fluids pain medication as needed.  They will admit to their service for further workup and treatment with likely operation tomorrow patient is to remain n.p.o. and we will place an NG tube to low intermittent suction.  Discussed this with the patient who is agreeable to plan of care and workup.  FINAL CLINICAL IMPRESSION(S) / ED DIAGNOSES   Inguinal hernia Small bowel obstruction  Note:  This document was prepared using Dragon voice recognition software and may  include unintentional dictation errors.   Minna Antis, MD 09/03/22 2037

## 2022-09-03 NOTE — ED Notes (Signed)
This tech and [name],EDT assisted pt to the restroom via wheelchair.  Pt was placed into a hospital gown. VS reassess. Not other needs found a this moment.

## 2022-09-03 NOTE — ED Notes (Signed)
Pt vomited just prior to transport. Emesis was clear, possibly the water used during the NG insertion attempts. Pt denied further nausea. Pt cleaned, placed in a fresh gown. Linens changed. Pt returned to a comfortable position.

## 2022-09-03 NOTE — Progress Notes (Signed)
Pt  w SBO caused by [facility]. Not toxic nor peritonitic. CT no free air or evidence of bowel compromise We will start fluid resuscitation, ng decompression and plan to perform open repair in am. D/W pt and Boyfriend in detail . They understand and are in agreement.

## 2022-09-03 NOTE — ED Triage Notes (Signed)
Pt to ED right sided abd pain for the past few hours. States was dx with hernia and instructed to come here per Dr Tonna Boehringer office d/t him being out of town. States they were not going to operate until she was in pain.

## 2022-09-04 ENCOUNTER — Inpatient Hospital Stay: Payer: Medicare Other | Admitting: Anesthesiology

## 2022-09-04 ENCOUNTER — Encounter
Admission: EM | Disposition: A | Discharge status: Nursing Home (Includes ICF) | Payer: Self-pay | Source: Skilled Nursing Facility | Attending: Surgery

## 2022-09-04 ENCOUNTER — Encounter: Payer: Self-pay | Admitting: Surgery

## 2022-09-04 ENCOUNTER — Other Ambulatory Visit: Payer: Self-pay

## 2022-09-04 DIAGNOSIS — K4030 Unilateral inguinal hernia, with obstruction, without gangrene, not specified as recurrent: Principal | ICD-10-CM

## 2022-09-04 DIAGNOSIS — K56609 Unspecified intestinal obstruction, unspecified as to partial versus complete obstruction: Secondary | ICD-10-CM

## 2022-09-04 HISTORY — PX: INGUINAL HERNIA REPAIR: SHX194

## 2022-09-04 LAB — CBC
HCT: 36.6 % (ref 36.0–46.0)
Hemoglobin: 11.8 g/dL — ABNORMAL LOW (ref 12.0–15.0)
MCH: 33 pg (ref 26.0–34.0)
MCHC: 32.2 g/dL (ref 30.0–36.0)
MCV: 102.2 fL — ABNORMAL HIGH (ref 80.0–100.0)
Platelets: 346 10*3/uL (ref 150–400)
RBC: 3.58 MIL/uL — ABNORMAL LOW (ref 3.87–5.11)
RDW: 11.8 % (ref 11.5–15.5)
WBC: 12.9 10*3/uL — ABNORMAL HIGH (ref 4.0–10.5)
nRBC: 0 % (ref 0.0–0.2)

## 2022-09-04 LAB — BASIC METABOLIC PANEL
Anion gap: 8 (ref 5–15)
BUN: 20 mg/dL (ref 8–23)
CO2: 22 mmol/L (ref 22–32)
Calcium: 8.6 mg/dL — ABNORMAL LOW (ref 8.9–10.3)
Chloride: 106 mmol/L (ref 98–111)
Creatinine, Ser: 0.95 mg/dL (ref 0.44–1.00)
GFR, Estimated: 58 mL/min — ABNORMAL LOW (ref >60)
Glucose, Bld: 115 mg/dL — ABNORMAL HIGH (ref 70–99)
Potassium: 4.9 mmol/L (ref 3.5–5.1)
Sodium: 136 mmol/L (ref 135–145)

## 2022-09-04 SURGERY — REPAIR, HERNIA, INGUINAL, ADULT
Anesthesia: General | Laterality: Right

## 2022-09-04 MED ORDER — BUPIVACAINE HCL (PF) 0.25 % IJ SOLN
INTRAMUSCULAR | Status: AC
  Filled 2022-09-04: qty 30

## 2022-09-04 MED ORDER — FENTANYL CITRATE (PF) 100 MCG/2ML IJ SOLN
INTRAMUSCULAR | Status: DC | PRN
  Administered 2022-09-04 (×2): 50 ug via INTRAVENOUS

## 2022-09-04 MED ORDER — PROPOFOL 10 MG/ML IV BOLUS
INTRAVENOUS | Status: AC
  Filled 2022-09-04: qty 20

## 2022-09-04 MED ORDER — ZOLPIDEM TARTRATE 5 MG PO TABS
5.0000 mg | ORAL_TABLET | Freq: Every evening | ORAL | Status: DC | PRN
  Administered 2022-09-04 – 2022-09-05 (×2): 5 mg via ORAL
  Filled 2022-09-04 (×2): qty 1

## 2022-09-04 MED ORDER — OXYCODONE HCL 5 MG PO TABS
5.0000 mg | ORAL_TABLET | ORAL | Status: DC | PRN
  Administered 2022-09-06: 5 mg via ORAL
  Filled 2022-09-04: qty 1

## 2022-09-04 MED ORDER — LIDOCAINE HCL (PF) 2 % IJ SOLN
INTRAMUSCULAR | Status: AC
  Filled 2022-09-04: qty 5

## 2022-09-04 MED ORDER — EPINEPHRINE PF 1 MG/ML IJ SOLN
INTRAMUSCULAR | Status: AC
  Filled 2022-09-04: qty 1

## 2022-09-04 MED ORDER — BUPIVACAINE-EPINEPHRINE 0.25% -1:200000 IJ SOLN
INTRAMUSCULAR | Status: DC | PRN
  Administered 2022-09-04: 50 mL

## 2022-09-04 MED ORDER — ZOLPIDEM TARTRATE 5 MG PO TABS: 10.0000 mg | ORAL_TABLET | Freq: Every evening | ORAL | Status: DC | PRN

## 2022-09-04 MED ORDER — SUGAMMADEX SODIUM 200 MG/2ML IV SOLN
INTRAVENOUS | Status: DC | PRN
  Administered 2022-09-04: 100 mg via INTRAVENOUS

## 2022-09-04 MED ORDER — FENTANYL CITRATE (PF) 100 MCG/2ML IJ SOLN
INTRAMUSCULAR | Status: AC
  Filled 2022-09-04: qty 2

## 2022-09-04 MED ORDER — ROCURONIUM BROMIDE 100 MG/10ML IV SOLN
INTRAVENOUS | Status: DC | PRN
  Administered 2022-09-04: 20 mg via INTRAVENOUS

## 2022-09-04 MED ORDER — GLYCOPYRROLATE 0.2 MG/ML IJ SOLN
INTRAMUSCULAR | Status: DC | PRN
  Administered 2022-09-04: .2 mg via INTRAVENOUS

## 2022-09-04 MED ORDER — PROPOFOL 10 MG/ML IV BOLUS
INTRAVENOUS | Status: DC | PRN
  Administered 2022-09-04: 70 mg via INTRAVENOUS

## 2022-09-04 MED ORDER — DEXAMETHASONE SODIUM PHOSPHATE 10 MG/ML IJ SOLN
INTRAMUSCULAR | Status: DC | PRN
  Administered 2022-09-04: 10 mg via INTRAVENOUS

## 2022-09-04 MED ORDER — SUCCINYLCHOLINE CHLORIDE 200 MG/10ML IV SOSY
PREFILLED_SYRINGE | INTRAVENOUS | Status: AC
  Filled 2022-09-04: qty 10

## 2022-09-04 MED ORDER — DEXAMETHASONE SODIUM PHOSPHATE 10 MG/ML IJ SOLN
INTRAMUSCULAR | Status: AC
  Filled 2022-09-04: qty 1

## 2022-09-04 MED ORDER — ONDANSETRON HCL 4 MG/2ML IJ SOLN
INTRAMUSCULAR | Status: AC
  Filled 2022-09-04: qty 2

## 2022-09-04 MED ORDER — ACETAMINOPHEN 10 MG/ML IV SOLN
INTRAVENOUS | Status: AC
  Filled 2022-09-04: qty 100

## 2022-09-04 MED ORDER — ONDANSETRON HCL 4 MG/2ML IJ SOLN
INTRAMUSCULAR | Status: DC | PRN
  Administered 2022-09-04: 4 mg via INTRAVENOUS

## 2022-09-04 MED ORDER — ACETAMINOPHEN 500 MG PO TABS
1000.0000 mg | ORAL_TABLET | Freq: Four times a day (QID) | ORAL | Status: DC
  Administered 2022-09-04 – 2022-09-05 (×2): 1000 mg via ORAL
  Filled 2022-09-04 (×4): qty 2

## 2022-09-04 MED ORDER — OXYCODONE HCL 5 MG PO TABS
5.0000 mg | ORAL_TABLET | Freq: Once | ORAL | Status: AC | PRN
  Administered 2022-09-04: 5 mg via ORAL

## 2022-09-04 MED ORDER — OXYCODONE HCL 5 MG PO TABS
ORAL_TABLET | ORAL | Status: AC
  Filled 2022-09-04: qty 1

## 2022-09-04 MED ORDER — GLYCOPYRROLATE 0.2 MG/ML IJ SOLN
INTRAMUSCULAR | Status: AC
  Filled 2022-09-04: qty 1

## 2022-09-04 MED ORDER — 0.9 % SODIUM CHLORIDE (POUR BTL) OPTIME
TOPICAL | Status: DC | PRN
  Administered 2022-09-04: 500 mL

## 2022-09-04 MED ORDER — ROCURONIUM BROMIDE 10 MG/ML (PF) SYRINGE
PREFILLED_SYRINGE | INTRAVENOUS | Status: AC
  Filled 2022-09-04: qty 10

## 2022-09-04 MED ORDER — ACETAMINOPHEN 10 MG/ML IV SOLN
INTRAVENOUS | Status: DC | PRN
  Administered 2022-09-04: 500 mg via INTRAVENOUS

## 2022-09-04 MED ORDER — SODIUM CHLORIDE 0.9 % IV SOLN: INTRAVENOUS | Status: DC

## 2022-09-04 MED ORDER — FENTANYL CITRATE (PF) 100 MCG/2ML IJ SOLN: 25.0000 ug | INTRAMUSCULAR | Status: DC | PRN

## 2022-09-04 MED ORDER — LIDOCAINE HCL (CARDIAC) PF 100 MG/5ML IV SOSY
PREFILLED_SYRINGE | INTRAVENOUS | Status: DC | PRN
  Administered 2022-09-04: 50 mg via INTRAVENOUS

## 2022-09-04 MED ORDER — SUCCINYLCHOLINE CHLORIDE 200 MG/10ML IV SOSY
PREFILLED_SYRINGE | INTRAVENOUS | Status: DC | PRN
  Administered 2022-09-04: 60 mg via INTRAVENOUS

## 2022-09-04 MED ORDER — OXYCODONE HCL 5 MG/5ML PO SOLN: 5.0000 mg | Freq: Once | ORAL | Status: AC | PRN

## 2022-09-04 MED ORDER — BUPIVACAINE LIPOSOME 1.3 % IJ SUSP
INTRAMUSCULAR | Status: AC
  Filled 2022-09-04: qty 20

## 2022-09-04 SURGICAL SUPPLY — 33 items
ADH SKN CLS APL DERMABOND .7 (GAUZE/BANDAGES/DRESSINGS) ×1
APL PRP STRL LF DISP 70% ISPRP (MISCELLANEOUS) ×1
BLADE CLIPPER SURG (BLADE) ×1 IMPLANT
CHLORAPREP W/TINT 26 (MISCELLANEOUS) ×1 IMPLANT
DERMABOND ADVANCED .7 DNX12 (GAUZE/BANDAGES/DRESSINGS) ×1 IMPLANT
DRAIN PENROSE 0.625X18 (DRAIN) ×1 IMPLANT
DRAPE INCISE IOBAN 66X45 STRL (DRAPES) ×1 IMPLANT
DRAPE LAPAROTOMY 77X122 PED (DRAPES) ×1 IMPLANT
ELECT CAUTERY BLADE 6.4 (BLADE) ×1 IMPLANT
ELECT REM PT RETURN 9FT ADLT (ELECTROSURGICAL) ×1
ELECTRODE REM PT RTRN 9FT ADLT (ELECTROSURGICAL) ×1 IMPLANT
GAUZE 4X4 16PLY ~~LOC~~+RFID DBL (SPONGE) ×1 IMPLANT
GLOVE BIO SURGEON STRL SZ7 (GLOVE) ×1 IMPLANT
GOWN STRL REUS W/ TWL LRG LVL3 (GOWN DISPOSABLE) ×2 IMPLANT
GOWN STRL REUS W/TWL LRG LVL3 (GOWN DISPOSABLE) ×2
MANIFOLD NEPTUNE II (INSTRUMENTS) ×1 IMPLANT
NDL HYPO 22X1.5 SAFETY MO (MISCELLANEOUS) ×1 IMPLANT
NEEDLE HYPO 22X1.5 SAFETY MO (MISCELLANEOUS) ×1 IMPLANT
NS IRRIG 1000ML POUR BTL (IV SOLUTION) ×1 IMPLANT
PACK BASIN MINOR ARMC (MISCELLANEOUS) ×1 IMPLANT
SPONGE KITTNER 5P (MISCELLANEOUS) ×1 IMPLANT
SPONGE T-LAP 18X18 ~~LOC~~+RFID (SPONGE) ×1 IMPLANT
SUT ETHIBOND NAB MO 7 #0 18IN (SUTURE) ×2 IMPLANT
SUT MNCRL AB 4-0 PS2 18 (SUTURE) ×1 IMPLANT
SUT VIC AB 2-0 SH 27 (SUTURE) ×1
SUT VIC AB 2-0 SH 27XBRD (SUTURE) IMPLANT
SUT VIC AB 3-0 54X BRD REEL (SUTURE) ×1 IMPLANT
SUT VIC AB 3-0 BRD 54 (SUTURE) ×1
SUT VIC AB 3-0 SH 27 (SUTURE) ×1
SUT VIC AB 3-0 SH 27X BRD (SUTURE) ×1 IMPLANT
SYR 20ML LL LF (SYRINGE) ×1 IMPLANT
TRAP FLUID SMOKE EVACUATOR (MISCELLANEOUS) ×1 IMPLANT
WATER STERILE IRR 500ML POUR (IV SOLUTION) ×1 IMPLANT

## 2022-09-04 NOTE — Op Note (Signed)
Open Right Inguinal Hernia Repair Modified Bassini       Pre-operative Diagnosis: incarcerated right Inguinal Hernia   Post-operative Diagnosis: Same   Procedure: Right  repair of Inguinal hernia   Surgeon: Sterling Big, MD FACS   Anesthesia: Gen. with endotracheal tube   Findings: Incarcerated Right indirect inguinal hernia that spontaneously reduced upon induction, no bowel compromise.         Procedure Details  The patient was seen again in the Holding Room. The benefits, complications, treatment options, and expected outcomes were discussed with the patient. The risks of bleeding, infection, recurrence of symptoms, failure to resolve symptoms, recurrence of hernia, ischemic orchitis, chronic pain syndrome or neuroma, were discussed again. The likelihood of improving the patient's symptoms with return to their baseline status is good.  The patient and/or family concurred with the proposed plan, giving informed consent.  The patient was taken to Operating Room, identified  and the procedure verified . Laterality confirmed.  A Time Out was held and the above information confirmed.   Prior to the induction of general anesthesia, antibiotic prophylaxis was administered. VTE prophylaxis was in place. General endotracheal anesthesia was then administered and tolerated well.  He is noted that the hernia reduces spontaneously after induction of anesthesia After the induction, the abdomen was prepped with Chloraprep and draped in the sterile fashion. The patient was positioned in the supine position. Right inguinal incision was created using 10 blade knife and electrocautery was used to dissect through subcutaneous tissue.  The external oblique fascia was incised all the way to the external inguinal ring. We  Were able to dissect the hernia sac from adjacent structures. Was found that the hernia was an indirect hernia.  The sac was opened and we ensure there was no evidence of bowel compromise.  The  hernia sac was close with a pursestring 2-0 Vicryl. Felt that the safest method of office repair was a tissue repair given her age and potential bacterial translocation related to bowel obstruction.  I perform a modified Bassini repair by approximating the conjoined tendon and the transversalis fascia to the shelving edge of the inguinal , multiple Ethibond sutures ligament obliterating the hernia space.  Also  identified the ilioinguinal ligament and I cauterized proximal and distally to avoid any inguinodynia. Liposomal marcaine used to infiltrate fascia and sub q. 2-0 Vicryl suture was used to close ext oblique fascia. 3-0 used to close scarpa's.  4-0 subcuticular Monocryl was used at all skin edges. Dermabond was placed.  Patient tolerated the procedure well. There were no complications. SHe was taken to the recovery room in stable condition.              Sterling Big, MD, FACS

## 2022-09-04 NOTE — Plan of Care (Signed)

## 2022-09-04 NOTE — H&P (Signed)
Patient ID: Felicia Hughes, female   DOB: 02-Feb-1934, 89 y.o.   MRN: [id number]  HPI Felicia Hughes is a 89 y.o. female presenting with an acute onset  of abdominal pain.  She does have history of right inguinal hernia and was seen by Dr. Lorin Picket in the outpatient setting was elected to be managed nonoperatively.  She does have chronic kidney disease hypertension hyperlipidemia.  Yesterday she presented with an acute onset of pain in the right inguinal area associated with nausea.  She denied any vomiting on initial presentation.  The pain was intermittent located in the right lower quadrant moderate to severe in intensity and seem to be aggravated with movements.  She came into the emergency room and a CT scan was performed that have personally reviewed showing evidence of a bowel obstruction from incarcerated right inguinal hernia no evidence of free air no evidence of bowel compromise.  She was admitted for further resuscitation and surgical intervention.  Unfortunately were not able to place NG last night due to patient reports tolerance and some bleeding. Wbc 7.9 hb 13.5, BUN 25 creat 0.89 She is able to perform more than 4 METS w/o SOB or C/p. Prior abdominal hysterectomy in the past and cholecystectomy. Chronic biliary dilation likely related to her age and prior chole.  HPI  Past Medical History:  Diagnosis Date   Arrhythmia    Chronic kidney disease    UTI   GERD (gastroesophageal reflux disease)    Glaucoma (increased eye pressure)    Hypercholesterolemia    Hypertension     Past Surgical History:  Procedure Laterality Date   ABDOMINAL HYSTERECTOMY     BREAST BIOPSY Right 06/11/2015   Procedure: BREAST BIOPSY WITH NEEDLE LOCALIZATION;  Surgeon: Nadeen Landau, MD;  Location: [facility] ORS;  Service: General;  Laterality: Right;   BREAST EXCISIONAL BIOPSY Right 06/11/2015   papilloma   BREAST SURGERY Right    Breast Needle Biopsy   CHOLECYSTECTOMY     DILATION AND CURETTAGE OF UTERUS      EYE SURGERY Bilateral    Cataract Extraction with IOL   LAPAROSCOPIC OOPHORECTOMY      Family History  Problem Relation Age of Onset   Breast cancer Mother 36       early 66's   Kidney cancer Neg Hx    Kidney disease Neg Hx    Prostate cancer Neg Hx    Bladder Cancer Neg Hx     Social History Social History   Tobacco Use   Smoking status: Never   Smokeless tobacco: Never  Vaping Use   Vaping Use: Never used  Substance Use Topics   Alcohol use: Yes    Alcohol/week: 1.0 standard drink of alcohol    Types: 1 Glasses of wine per week    Comment: wine daily   Drug use: No    Allergies  Allergen Reactions   Nitrofurantoin Hives   Sulfa Antibiotics Rash    Current Facility-Administered Medications  Medication Dose Route Frequency Provider Last Rate Last Admin   0.9 %  sodium chloride infusion   Intravenous Continuous Leafy Ro, MD 125 mL/hr at 09/04/22 0003 New Bag at 09/04/22 0003   acetaminophen (OFIRMEV) IV 1,000 mg  1,000 mg Intravenous Q6H Molina Hollenback F, MD 400 mL/hr at 09/04/22 0615 1,000 mg at 09/04/22 0615   diphenhydrAMINE (BENADRYL) 12.5 MG/5ML elixir 12.5 mg  12.5 mg Oral Q6H PRN Leafy Ro, MD       Or  diphenhydrAMINE (BENADRYL) injection 12.5 mg  12.5 mg Intravenous Q6H PRN Benen Weida F, MD   12.5 mg at 09/03/22 2357   heparin injection 5,000 Units  5,000 Units Subcutaneous Q8H Khole Arterburn F, MD   5,000 Units at 09/04/22 0344   hydrALAZINE (APRESOLINE) injection 10 mg  10 mg Intravenous Q2H PRN Indria Bishara F, MD       ketorolac (TORADOL) 15 MG/ML injection 15 mg  15 mg Intravenous Q6H PRN Jearld Hemp F, MD       morphine (PF) 4 MG/ML injection 4 mg  4 mg Intravenous Q3H PRN Urban Naval F, MD   4 mg at 09/03/22 2358   ondansetron (ZOFRAN-ODT) disintegrating tablet 4 mg  4 mg Oral Q6H PRN Semaj Kham F, MD       Or   ondansetron (ZOFRAN) injection 4 mg  4 mg Intravenous Q6H PRN Pati Thinnes F, MD       pantoprazole (PROTONIX) injection 40  mg  40 mg Intravenous Q12H Cheyanne Lamison F, MD   40 mg at 09/03/22 2357   piperacillin-tazobactam (ZOSYN) IVPB 3.375 g  3.375 g Intravenous Q8H Jacobb Alen F, MD 12.5 mL/hr at 09/04/22 0618 3.375 g at 09/04/22 1610   prochlorperazine (COMPAZINE) tablet 10 mg  10 mg Oral Q6H PRN Tyreik Delahoussaye F, MD       Or   prochlorperazine (COMPAZINE) injection 5-10 mg  5-10 mg Intravenous Q6H PRN Ardon Franklin F, MD         Review of Systems Full ROS  was asked and was negative except for the information on the HPI  Physical Exam Blood pressure (!) 108/56, pulse 90, temperature 98.6 F (37 C), resp. rate 16, height 5' (1.524 m), weight 45.4 kg, SpO2 96 %. CONSTITUTIONAL: NAD. EYES: Pupils are equal, round,, Sclera are non-icteric. EARS, NOSE, MOUTH AND THROAT: The oral mucosa is pink and moist. Hearing is intact to voice. LYMPH NODES:  Lymph nodes in the neck are normal. RESPIRATORY:  Lungs are clear. There is normal respiratory effort, with equal breath sounds bilaterally, and without pathologic use of accessory muscles. CARDIOVASCULAR: Heart is regular without murmurs, gallops, or rubs. GI: The abdomen is  soft, mildly distended. Right incarcerated inguinal hernia.  There are no palpable masses. There is no hepatosplenomegaly. There are normal bowel sounds. GU: Rectal deferred.   MUSCULOSKELETAL: Normal muscle strength and tone. No cyanosis or edema.   SKIN: Turgor is good and there are no pathologic skin lesions or ulcers. NEUROLOGIC: Motor and sensation is grossly normal. Cranial nerves are grossly intact. PSYCH:  Oriented to person, place and time. Affect is normal.  Data Reviewed  I have personally reviewed the patient's imaging, laboratory findings and medical records.    Assessment/Plan 89 year old female with small bowel obstruction caused by incarcerated right inguinal hernia in need for urgent repair.  We have admitted her and attempted NG tube decompression but unfortunately she left and  was not able to tolerate this.  She will require urgent surgical intervention.  Discussed with the patient and the family in detail regarding her disease process and about the need for surgical intervention.  There is a chance that I may have to do a bowel resection.  Procedure discussed with them in detail.  Risk, benefits and possible implications including but not limited to: Bleeding, infection, bowel injuries and perioperative complications.  I do not think there is other good alternatives.  We may have to do a laparotomy if we find that  the bowel is compromised.  We have also started appropriate antibiotics and fluid resuscitation.  Sterling Big, MD FACS General Surgeon 09/04/2022, 8:19 AM

## 2022-09-04 NOTE — Anesthesia Postprocedure Evaluation (Signed)
Anesthesia Post Note  Patient: Felicia Hughes  Procedure(s) Performed: HERNIA REPAIR INGUINAL ADULT (Right)  Patient location during evaluation: PACU Anesthesia Type: General Level of consciousness: awake and alert Pain management: pain level controlled Vital Signs Assessment: post-procedure vital signs reviewed and stable Respiratory status: spontaneous breathing, nonlabored ventilation, respiratory function stable and patient connected to nasal cannula oxygen Cardiovascular status: blood pressure returned to baseline and stable Postop Assessment: no apparent nausea or vomiting Anesthetic complications: no   No notable events documented.   Last Vitals:  Vitals:   09/04/22 1110 09/04/22 1215  BP: 139/82 134/80  Pulse: 94 88  Resp: 20   Temp: (!) 36.1 C   SpO2: 93% 95%    Last Pain:  Vitals:   09/04/22 1220  TempSrc:   PainSc: 9                  Kenneshia Rehm K Shye Doty

## 2022-09-04 NOTE — Transfer of Care (Signed)
Immediate Anesthesia Transfer of Care Note  Patient: Felicia Hughes  Procedure(s) Performed: HERNIA REPAIR INGUINAL ADULT (Right)  Patient Location: PACU  Anesthesia Type:General  Level of Consciousness: sedated  Airway & Oxygen Therapy: Patient Spontanous Breathing and Patient connected to face mask oxygen  Post-op Assessment: Report given to RN and Post -op Vital signs reviewed and stable  Post vital signs: Reviewed  Last Vitals:  Vitals Value Taken Time  BP 135/73   Temp    Pulse 93   Resp 16   SpO2 100     Last Pain:  Vitals:   09/04/22 0857  TempSrc:   PainSc: 0-No pain         Complications: No notable events documented.

## 2022-09-04 NOTE — Anesthesia Procedure Notes (Signed)
Procedure Name: Intubation Date/Time: 09/04/2022 9:31 AM  Performed by: Mathews Argyle, CRNAPre-anesthesia Checklist: Patient identified, Patient being monitored, Timeout performed, Emergency Drugs available and Suction available Patient Re-evaluated:Patient Re-evaluated prior to induction Oxygen Delivery Method: Circle system utilized Preoxygenation: Pre-oxygenation with 100% oxygen Induction Type: IV induction Ventilation: Mask ventilation without difficulty Laryngoscope Size: 3 and McGraph Grade View: Grade I Tube type: Oral Tube size: 7.0 mm Number of attempts: 1 Airway Equipment and Method: Stylet and Video-laryngoscopy Placement Confirmation: ETT inserted through vocal cords under direct vision, positive ETCO2 and breath sounds checked- equal and bilateral Secured at: 20 cm Tube secured with: Tape Dental Injury: Teeth and Oropharynx as per pre-operative assessment

## 2022-09-04 NOTE — Anesthesia Preprocedure Evaluation (Signed)
Anesthesia Evaluation  Patient identified by MRN, date of birth, ID band Patient awake    Reviewed: Allergy & Precautions, NPO status , Patient's Chart, lab work & pertinent test results  History of Anesthesia Complications Negative for: history of anesthetic complications  Airway Mallampati: III  TM Distance: >3 FB Neck ROM: full    Dental  (+) Chipped   Pulmonary neg pulmonary ROS, neg shortness of breath   Pulmonary exam normal        Cardiovascular Exercise Tolerance: Good hypertension, (-) angina (-) Past MI Normal cardiovascular exam     Neuro/Psych negative neurological ROS  negative psych ROS   GI/Hepatic Neg liver ROS,GERD  Controlled,,  Endo/Other  negative endocrine ROS    Renal/GU Renal disease     Musculoskeletal   Abdominal   Peds  Hematology negative hematology ROS (+)   Anesthesia Other Findings Past Medical History: No date: Arrhythmia No date: Chronic kidney disease     Comment:  UTI No date: GERD (gastroesophageal reflux disease) No date: Glaucoma (increased eye pressure) No date: Hypercholesterolemia No date: Hypertension  Past Surgical History: No date: ABDOMINAL HYSTERECTOMY 06/11/2015: BREAST BIOPSY; Right     Comment:  Procedure: BREAST BIOPSY WITH NEEDLE LOCALIZATION;                Surgeon: Nadeen Landau, MD;  Location: [facility] ORS;                Service: General;  Laterality: Right; 06/11/2015: BREAST EXCISIONAL BIOPSY; Right     Comment:  papilloma No date: BREAST SURGERY; Right     Comment:  Breast Needle Biopsy No date: CHOLECYSTECTOMY No date: DILATION AND CURETTAGE OF UTERUS No date: EYE SURGERY; Bilateral     Comment:  Cataract Extraction with IOL No date: LAPAROSCOPIC OOPHORECTOMY  BMI    Body Mass Index: 19.53 kg/m      Reproductive/Obstetrics negative OB ROS                             Anesthesia Physical Anesthesia Plan  ASA:  3  Anesthesia Plan: General ETT   Post-op Pain Management:    Induction: Intravenous  PONV Risk Score and Plan: Ondansetron, Dexamethasone, Midazolam and Treatment may vary due to age or medical condition  Airway Management Planned: Oral ETT  Additional Equipment:   Intra-op Plan:   Post-operative Plan: Extubation in OR  Informed Consent: I have reviewed the patients History and Physical, chart, labs and discussed the procedure including the risks, benefits and alternatives for the proposed anesthesia with the patient or authorized representative who has indicated his/her understanding and acceptance.     Dental Advisory Given  Plan Discussed with: Anesthesiologist, CRNA and Surgeon  Anesthesia Plan Comments: (Patient consented for risks of anesthesia including but not limited to:  - adverse reactions to medications - damage to eyes, teeth, lips or other oral mucosa - nerve damage due to positioning  - sore throat or hoarseness - Damage to heart, brain, nerves, lungs, other parts of body or loss of life  Patient voiced understanding.)       Anesthesia Quick Evaluation

## 2022-09-05 ENCOUNTER — Encounter: Payer: Self-pay | Admitting: Surgery

## 2022-09-05 LAB — CBC
HCT: 32.8 % — ABNORMAL LOW (ref 36.0–46.0)
Hemoglobin: 10.5 g/dL — ABNORMAL LOW (ref 12.0–15.0)
MCH: 32.5 pg (ref 26.0–34.0)
MCHC: 32 g/dL (ref 30.0–36.0)
MCV: 101.5 fL — ABNORMAL HIGH (ref 80.0–100.0)
Platelets: 320 10*3/uL (ref 150–400)
RBC: 3.23 MIL/uL — ABNORMAL LOW (ref 3.87–5.11)
RDW: 12 % (ref 11.5–15.5)
WBC: 16.5 10*3/uL — ABNORMAL HIGH (ref 4.0–10.5)
nRBC: 0 % (ref 0.0–0.2)

## 2022-09-05 LAB — BASIC METABOLIC PANEL
Anion gap: 5 (ref 5–15)
BUN: 15 mg/dL (ref 8–23)
CO2: 21 mmol/L — ABNORMAL LOW (ref 22–32)
Calcium: 8.6 mg/dL — ABNORMAL LOW (ref 8.9–10.3)
Chloride: 111 mmol/L (ref 98–111)
Creatinine, Ser: 0.88 mg/dL (ref 0.44–1.00)
GFR, Estimated: >60 mL/min (ref >60)
Glucose, Bld: 101 mg/dL — ABNORMAL HIGH (ref 70–99)
Potassium: 4.2 mmol/L (ref 3.5–5.1)
Sodium: 137 mmol/L (ref 135–145)

## 2022-09-05 MED ORDER — HYDROCODONE-ACETAMINOPHEN 5-325 MG PO TABS: 1.0000 | ORAL_TABLET | Freq: Four times a day (QID) | ORAL | 0 refills | Status: DC | PRN

## 2022-09-05 MED ORDER — LOSARTAN POTASSIUM 25 MG PO TABS
25.0000 mg | ORAL_TABLET | Freq: Every day | ORAL | Status: DC
  Administered 2022-09-05 – 2022-09-06 (×2): 25 mg via ORAL
  Filled 2022-09-05 (×2): qty 1

## 2022-09-05 MED ORDER — ACETAMINOPHEN 325 MG PO TABS: 650.0000 mg | ORAL_TABLET | Freq: Three times a day (TID) | ORAL | 0 refills | Status: DC | PRN

## 2022-09-05 MED ORDER — ACETAMINOPHEN 325 MG PO TABS
650.0000 mg | ORAL_TABLET | Freq: Four times a day (QID) | ORAL | Status: DC | PRN
  Administered 2022-09-05: 650 mg via ORAL
  Filled 2022-09-05: qty 2

## 2022-09-05 MED ORDER — DOCUSATE SODIUM 100 MG PO CAPS: 100.0000 mg | ORAL_CAPSULE | Freq: Two times a day (BID) | ORAL | 0 refills | Status: DC | PRN

## 2022-09-05 MED ORDER — DOCUSATE SODIUM 100 MG PO CAPS
100.0000 mg | ORAL_CAPSULE | Freq: Two times a day (BID) | ORAL | Status: DC
  Administered 2022-09-05 – 2022-09-06 (×2): 100 mg via ORAL
  Filled 2022-09-05 (×2): qty 1

## 2022-09-05 NOTE — Progress Notes (Signed)
Mobility Specialist - Progress Note   09/05/22 1129  Mobility  Activity Ambulated with assistance in hallway  Level of Assistance Standby assist, set-up cues, supervision of patient - no hands on  Assistive Device Cane  Distance Ambulated (ft) 90 ft  Activity Response Tolerated well  Mobility Referral Yes  $Mobility charge 1 Mobility  Mobility Specialist Start Time (ACUTE ONLY) 1110  Mobility Specialist Stop Time (ACUTE ONLY) 1128  Mobility Specialist Time Calculation (min) (ACUTE ONLY) 18 min   Pt OOB on RA upon arrival. Pt ambulates in hallway SBA with no LOB noted. Pt returns to EOB with needs in reach.   Terrilyn Saver  Mobility Specialist  09/05/22 11:30 AM

## 2022-09-05 NOTE — Progress Notes (Signed)
Patient is moderate fall risk. Bed alarm was set. Patient told this RN she does not wish to have the bed alarm on. RN discussed the risks of not having it set and RN explained to this patient that she should call before getting out of bed. Patient agrees to call before getting out of bed and continues to refuse bed alarm.

## 2022-09-05 NOTE — Discharge Instructions (Signed)
Hernia repair, Care After ?This sheet gives you information about how to care for yourself after your procedure. Your health care provider may also give you more specific instructions. If you have problems or questions, contact your health care provider. ?What can I expect after the procedure? ?After your procedure, it is common to have the following: ?Pain in your abdomen, especially in the incision areas. You will be given medicine to control the pain. ?Tiredness. This is a normal part of the recovery process. Your energy level will return to normal over the next several weeks. ?Changes in your bowel movements, such as constipation or needing to go more often. Talk with your health care provider about how to manage this. ?Follow these instructions at home: ?Medicines ? tylenol and advil as needed for discomfort.  Please alternate between the two every four hours as needed for pain.   ? Use narcotics, if prescribed, only when tylenol and motrin is not enough to control pain. ? 325-650mg every 8hrs to max of 3000mg/24hrs (including the 325mg in every norco dose) for the tylenol.   ? Advil up to 800mg per dose every 8hrs as needed for pain.   ?PLEASE RECORD NUMBER OF PILLS TAKEN UNTIL NEXT FOLLOW UP APPT.  THIS WILL HELP DETERMINE HOW READY YOU ARE TO BE RELEASED FROM ANY ACTIVITY RESTRICTIONS ?Do not drive or use heavy machinery while taking prescription pain medicine. ?Do not drink alcohol while taking prescription pain medicine. ? ?Incision care ? ?  ?Follow instructions from your health care provider about how to take care of your incision areas. Make sure you: ?Keep your incisions clean and dry. ?Wash your hands with soap and water before and after applying medicine to the areas, and before and after changing your bandage (dressing). If soap and water are not available, use hand sanitizer. ?Change your dressing as told by your health care provider. ?Leave stitches (sutures), skin glue, or adhesive strips in  place. These skin closures may need to stay in place for 2 weeks or longer. If adhesive strip edges start to loosen and curl up, you may trim the loose edges. Do not remove adhesive strips completely unless your health care provider tells you to do that. ?Do not wear tight clothing over the incisions. Tight clothing may rub and irritate the incision areas, which may cause the incisions to open. ?Do not take baths, swim, or use a hot tub until your health care provider approves. OK TO SHOWER IN 24HRS.   ?Check your incision area every day for signs of infection. Check for: ?More redness, swelling, or pain. ?More fluid or blood. ?Warmth. ?Pus or a bad smell. ?Activity ?Avoid lifting anything that is heavier than 10 lb (4.5 kg) for 2 weeks or until your health care provider says it is okay. ?No pushing/pulling greater than 30lbs ?You may resume normal activities as told by your health care provider. Ask your health care provider what activities are safe for you. ?Take rest breaks during the day as needed. ?Eating and drinking ?Follow instructions from your health care provider about what you can eat after surgery. ?To prevent or treat constipation while you are taking prescription pain medicine, your health care provider may recommend that you: ?Drink enough fluid to keep your urine clear or pale yellow. ?Take over-the-counter or prescription medicines. ?Eat foods that are high in fiber, such as fresh fruits and vegetables, whole grains, and beans. ?Limit foods that are high in fat and processed sugars, such as fried and   sweet foods. ?General instructions ?Ask your health care provider when you will need an appointment to get your sutures or staples removed. ?Keep all follow-up visits as told by your health care provider. This is important. ?Contact a health care provider if: ?You have more redness, swelling, or pain around your incisions. ?You have more fluid or blood coming from the incisions. ?Your incisions feel  warm to the touch. ?You have pus or a bad smell coming from your incisions or your dressing. ?You have a fever. ?You have an incision that breaks open (edges not staying together) after sutures or staples have been removed. ?You develop a rash. ?You have chest pain or difficulty breathing. ?You have pain or swelling in your legs. ?You feel light-headed or you faint. ?Your abdomen swells (becomes distended). ?You have nausea or vomiting. ?You have blood in your stool (feces). ?This information is not intended to replace advice given to you by your health care provider. Make sure you discuss any questions you have with your health care provider. ?Document Released: 09/26/2004 Document Revised: 11/26/2017 Document Reviewed: 12/09/2015 ?Elsevier Interactive Patient Education ? 2019 [employer] ?  ? ?

## 2022-09-05 NOTE — Progress Notes (Signed)
Subjective:  CC: Felicia Hughes is a 89 y.o. female  Hospital stay day 2, 1 Day Post-Op open right inguinal hernia repair with primary tissue secondary to small bowel obstruction  HPI: No complaints overnight.  Tolerated clear liquids for dinner.  Passing flatus, no BM  ROS:  General: Denies weight loss, weight gain, fatigue, fevers, chills, and night sweats. Heart: Denies chest pain, palpitations, racing heart, irregular heartbeat, leg pain or swelling, and decreased activity tolerance. Respiratory: Denies breathing difficulty, shortness of breath, wheezing, cough, and sputum. GI: Denies change in appetite, heartburn, nausea, vomiting, constipation, diarrhea, and blood in stool. GU: Denies difficulty urinating, pain with urinating, urgency, frequency, blood in urine.   Objective:   Temp:  [97 F (36.1 C)-98.8 F (37.1 C)] 98.8 F (37.1 C) (06/15 0716) Pulse Rate:  [82-98] 84 (06/15 0716) Resp:  [15-24] 16 (06/15 0716) BP: (102-146)/(52-88) 102/52 (06/15 0716) SpO2:  [93 %-100 %] 96 % (06/15 0716)     Height: 5' (152.4 cm) Weight: 45.4 kg BMI (Calculated): 19.53   Intake/Output this shift:   Intake/Output Summary (Last 24 hours) at 09/05/2022 1003 Last data filed at 09/05/2022 0300 Gross per 24 hour  Intake 2194.74 ml  Output --  Net 2194.74 ml    Constitutional :  alert, cooperative, appears stated age, and no distress  Respiratory:  clear to auscultation bilaterally  Cardiovascular:  regular rate and rhythm  Gastrointestinal: Soft, no guarding, minimal tenderness to palpation right groin incision site .   Skin: Cool and moist.  Incision clean dry and intact, as expected swelling with some bruising  Psychiatric: Normal affect, non-agitated, not confused       LABS:     Latest Ref Rng & Units 09/05/2022    5:30 AM 09/04/2022    4:09 AM 09/03/2022    4:19 PM  CMP  Glucose 70 - 99 mg/dL 161  096  045   BUN 8 - 23 mg/dL 15  20  25    Creatinine 0.44 - 1.00 mg/dL 4.09  8.11   9.14   Sodium 135 - 145 mmol/L 137  136  137   Potassium 3.5 - 5.1 mmol/L 4.2  4.9  4.3   Chloride 98 - 111 mmol/L 111  106  101   CO2 22 - 32 mmol/L 21  22  26    Calcium 8.9 - 10.3 mg/dL 8.6  8.6  9.6   Total Protein 6.5 - 8.1 g/dL   6.7   Total Bilirubin 0.3 - 1.2 mg/dL   0.8   Alkaline Phos 38 - 126 U/L   75   AST 15 - 41 U/L   24   ALT 0 - 44 U/L   18       Latest Ref Rng & Units 09/05/2022    5:30 AM 09/04/2022    4:09 AM 09/03/2022    4:19 PM  CBC  WBC 4.0 - 10.5 K/uL 16.5  12.9  7.9   Hemoglobin 12.0 - 15.0 g/dL 78.2  95.6  21.3   Hematocrit 36.0 - 46.0 % 32.8  36.6  41.4   Platelets 150 - 400 K/uL 320  346  436     RADS: N/a Assessment:   S/p open right inguinal hernia repair with primary tissue secondary to small bowel obstruction.  regular diet today. ambulate in halls. If she has BM and tolerates her regular diet, potential d/c later today   labs/images/medications/previous chart entries reviewed personally and relevant changes/updates noted above.

## 2022-09-05 NOTE — TOC Initial Note (Signed)
Transition of Care [facility]) - Initial/Assessment Note    Patient Details  Name: Felicia Hughes MRN: [id number] Date of Birth: 1933/07/12  Transition of Care [facility]) CM/SW Contact:    Allena Katz, LCSW Phone Number: 09/05/2022, 11:39 AM  Clinical Narrative:    Pt admitted from [facility] [place].  Pt had hernia repair. Pt ambulating in room with Cane. If patient tolerates regular diet and has a BM she can discharge today. TOC following for care plan updates and needs.                    Patient Goals and CMS Choice            Expected Discharge Plan and Services                                              Prior Living Arrangements/Services                       Activities of Daily Living Home Assistive Devices/Equipment: Cane (specify quad or straight) (Straight) ADL Screening (condition at time of admission) Patient's cognitive ability adequate to safely complete daily activities?: Yes Is the patient deaf or have difficulty hearing?: Yes Does the patient have difficulty seeing, even when wearing glasses/contacts?: No Does the patient have difficulty concentrating, remembering, or making decisions?: No Patient able to express need for assistance with ADLs?: Yes Does the patient have difficulty dressing or bathing?: No Independently performs ADLs?: Yes (appropriate for developmental age) Does the patient have difficulty walking or climbing stairs?: Yes Weakness of Legs: Both Weakness of Arms/Hands: None  Permission Sought/Granted                  Emotional Assessment              Admission diagnosis:  Small bowel obstruction (HCC) [K56.609] SBO (small bowel obstruction) (HCC) [K56.609] Hernia with obstruction [K46.0] Patient Active Problem List   Diagnosis Date Noted   Incarcerated inguinal hernia, unilateral 09/04/2022   Small bowel obstruction (HCC) 09/03/2022   Glaucoma (increased eye pressure) 06/01/2018   Hyperlipidemia  06/01/2018   Hypertension 06/01/2018   Increased frequency of urination 04/30/2018   Overactive bladder 03/31/2016   Recurrent UTI 06/18/2015   Urge incontinence 05/22/2014   PCP:  Barbette Reichmann, MD Pharmacy:   [facility] [phone] [address] [address], [place] - [address] [place] [address] Phone: [phone] Fax: [phone]     Social Determinants of Health (SDOH) Social History: SDOH Screenings   Food Insecurity: No Food Insecurity (09/03/2022)  Housing: Low Risk  (09/03/2022)  Transportation Needs: No Transportation Needs (09/03/2022)  Utilities: Not At Risk (09/03/2022)  Tobacco Use: Low Risk  (09/05/2022)   SDOH Interventions:     Readmission Risk Interventions    09/05/2022   11:39 AM  Readmission Risk Prevention Plan  Post Dischage Appt Complete  Medication Screening Complete  Transportation Screening Complete

## 2022-09-05 NOTE — Plan of Care (Signed)

## 2022-09-05 NOTE — Plan of Care (Signed)

## 2022-09-06 ENCOUNTER — Inpatient Hospital Stay: Payer: Medicare Other

## 2022-09-06 NOTE — Progress Notes (Signed)
MEWS Progress Note  Patient Details Name: Felicia Hughes MRN: [id number] DOB: 1933/03/26 Today's Date: 09/06/2022   MEWS Flowsheet Documentation:  Assess: MEWS Score Temp: 99 F (37.2 C) BP: 129/64 MAP (mmHg): 82 Pulse Rate: (!) 114 ECG Heart Rate: 95 Resp: 16 Level of Consciousness: Alert SpO2: 97 % O2 Device: Room Air Patient Activity (if Appropriate): In bed O2 Flow Rate (L/min): 4 L/min Assess: MEWS Score MEWS Temp: 0 MEWS Systolic: 0 MEWS Pulse: 2 MEWS RR: 0 MEWS LOC: 0 MEWS Score: 2 MEWS Score Color: Yellow Assess: SIRS CRITERIA SIRS Temperature : 0 SIRS Respirations : 0 SIRS Pulse: 1 SIRS WBC: 0 SIRS Score Sum : 1 SIRS Temperature : 0 SIRS Pulse: 1 SIRS Respirations : 0 SIRS WBC: 0 SIRS Score Sum : 1 Assess: if the MEWS score is Yellow or Red Were vital signs taken at a resting state?: Yes Focused Assessment: No change from prior assessment Does the patient meet 2 or more of the SIRS criteria?: No MEWS guidelines implemented : Yes, yellow Treat MEWS Interventions: Considered administering scheduled or prn medications/treatments as ordered Take Vital Signs Increase Vital Sign Frequency : Yellow: Q2hr x1, continue Q4hrs until patient remains green for 12hrs Escalate MEWS: Escalate: Yellow: Discuss with charge nurse and consider notifying provider and/or RRT Notify: Charge Nurse/RN Name of Charge Nurse/RN Notified: Lupita Leash, RN Provider Notification Provider Name/Title: Sung Amabile, DO Date Provider Notified: 09/06/22 Time Provider Notified: [phone] Method of Notification: Page, Call (secure chat and text page) Notification Reason: Other (Comment) (yellow MEWS, HR elevated asymptomatic, cough x 3 days new nasal congestion) Provider response: See new orders      Ernest Mallick 09/06/2022, 6:52 AM

## 2022-09-06 NOTE — Progress Notes (Signed)
Mobility Specialist - Progress Note   09/06/22 0905  Mobility  Activity Ambulated independently in room;Ambulated independently to bathroom;Stood at bedside;Dangled on edge of bed  Level of Assistance Independent  Assistive Device None  Distance Ambulated (ft) 15 ft  Activity Response Tolerated well  Mobility Referral Yes  $Mobility charge 1 Mobility  Mobility Specialist Start Time (ACUTE ONLY) [phone]  Mobility Specialist Stop Time (ACUTE ONLY) [id number]  Mobility Specialist Time Calculation (min) (ACUTE ONLY) 11 min   Pt supine in bed on RA upon arrival. Pt STS and ambulates to/from bathroom indep. Pt returns to EOB with needs in reach.   Terrilyn Saver  Mobility Specialist  09/06/22 9:06 AM

## 2022-09-06 NOTE — Plan of Care (Signed)

## 2022-09-06 NOTE — Discharge Summary (Signed)
Physician Discharge Summary  Patient ID: [place] Jurado MRN: [id number] DOB/AGE: 87/25/1935 87 y.o.  Admit date: 09/03/2022 Discharge date: 09/06/2022  Admission Diagnoses: Incarcerated inguinal hernia  Discharge Diagnoses:  Same as above  Discharged Condition: good  Hospital Course: admitted for above. Underwent surgery.  Please see op note for details.  Post op, recovered as expected.  At time of d/c, tolerating diet and pain controlled, bowel movements noted.  She did complain of some increased coughing the night prior to discharge so chest x-ray was obtained.  Final read pending but no obvious pathology noted and patient's O2 saturation remained stable.  No evidence of infection on blood work either.  Consults: None  Discharge Exam: Blood pressure (!) 142/88, pulse 90, temperature 98.4 F (36.9 C), resp. rate 16, height 5' (1.524 m), weight 45.4 kg, SpO2 97 %. General appearance: alert, cooperative, and no distress GI: Soft, no guarding, appropriate tenderness to palpation around right groin incision site as expected.  Incision clean dry and intact.  Disposition:  Discharge disposition: 01-Home or Self Care       Discharge Instructions     Discharge patient   Complete by: As directed    Discharge disposition: 01-Home or Self Care   Discharge patient date: 09/06/2022      Allergies as of 09/06/2022       Reactions   Nitrofurantoin Hives   Sulfa Antibiotics Rash        Medication List     TAKE these medications    acetaminophen 325 MG tablet Commonly known as: Tylenol Take 2 tablets (650 mg total) by mouth every 8 (eight) hours as needed for mild pain.   atorvastatin 20 MG tablet Commonly known as: LIPITOR Take 20 mg by mouth daily at 6 PM.   Centrum Silver 50+Women Tabs Take by mouth.   cholecalciferol 1000 units tablet Commonly known as: VITAMIN D Take 1,000 Units by mouth daily.   docusate sodium 100 MG capsule Commonly known as: Colace Take 1  capsule (100 mg total) by mouth 2 (two) times daily as needed for up to 10 days for mild constipation.   Gemtesa 75 MG Tabs Generic drug: Vibegron Take 1 tablet (75 mg total) by mouth daily.   HYDROcodone-acetaminophen 5-325 MG tablet Commonly known as: Norco Take 1 tablet by mouth every 6 (six) hours as needed for up to 15 doses for moderate pain.   losartan 25 MG tablet Commonly known as: COZAAR Take 25 mg by mouth daily.   zolpidem 10 MG tablet Commonly known as: AMBIEN Take 10 mg by mouth at bedtime as needed for sleep.        Follow-up Information     Pabon, [place] F, MD Follow up in 1 week(s).   Specialty: General Surgery Why: post op inguinal hernia repair Contact information: [address] [place] [address] [phone]                  Total time spent arranging discharge was >4min. Signed: Sung Amabile 09/06/2022, 10:25 AM

## 2022-09-14 ENCOUNTER — Inpatient Hospital Stay: Payer: Medicare Other

## 2022-09-14 ENCOUNTER — Inpatient Hospital Stay: Payer: Medicare Other | Admitting: Anesthesiology

## 2022-09-14 ENCOUNTER — Emergency Department: Payer: Medicare Other

## 2022-09-14 ENCOUNTER — Encounter: Payer: Medicare Other | Admitting: Surgery

## 2022-09-14 ENCOUNTER — Other Ambulatory Visit: Payer: Self-pay

## 2022-09-14 ENCOUNTER — Encounter
Admission: EM | Disposition: A | Discharge status: Skilled Nursing Facility | Payer: Self-pay | Source: Home / Self Care | Attending: Internal Medicine

## 2022-09-14 ENCOUNTER — Inpatient Hospital Stay
Admission: EM | Admit: 2022-09-14 | Discharge: 2022-09-21 | Disposition: A | Discharge status: Skilled Nursing Facility | Payer: Medicare Other | Attending: Internal Medicine | Admitting: Internal Medicine

## 2022-09-14 DIAGNOSIS — N1831 Chronic kidney disease, stage 3a: Secondary | ICD-10-CM | POA: Diagnosis present

## 2022-09-14 DIAGNOSIS — E872 Acidosis, unspecified: Secondary | ICD-10-CM | POA: Diagnosis present

## 2022-09-14 DIAGNOSIS — K265 Chronic or unspecified duodenal ulcer with perforation: Secondary | ICD-10-CM | POA: Diagnosis present

## 2022-09-14 DIAGNOSIS — H409 Unspecified glaucoma: Secondary | ICD-10-CM | POA: Diagnosis present

## 2022-09-14 DIAGNOSIS — E876 Hypokalemia: Secondary | ICD-10-CM | POA: Diagnosis present

## 2022-09-14 DIAGNOSIS — K5100 Ulcerative (chronic) pancolitis without complications: Secondary | ICD-10-CM | POA: Diagnosis not present

## 2022-09-14 DIAGNOSIS — K529 Noninfective gastroenteritis and colitis, unspecified: Secondary | ICD-10-CM

## 2022-09-14 DIAGNOSIS — Z803 Family history of malignant neoplasm of breast: Secondary | ICD-10-CM

## 2022-09-14 DIAGNOSIS — Z9049 Acquired absence of other specified parts of digestive tract: Secondary | ICD-10-CM

## 2022-09-14 DIAGNOSIS — Z882 Allergy status to sulfonamides status: Secondary | ICD-10-CM

## 2022-09-14 DIAGNOSIS — E871 Hypo-osmolality and hyponatremia: Secondary | ICD-10-CM | POA: Diagnosis present

## 2022-09-14 DIAGNOSIS — J90 Pleural effusion, not elsewhere classified: Secondary | ICD-10-CM | POA: Diagnosis present

## 2022-09-14 DIAGNOSIS — E860 Dehydration: Secondary | ICD-10-CM | POA: Diagnosis present

## 2022-09-14 DIAGNOSIS — A419 Sepsis, unspecified organism: Principal | ICD-10-CM | POA: Diagnosis present

## 2022-09-14 DIAGNOSIS — Z9841 Cataract extraction status, right eye: Secondary | ICD-10-CM

## 2022-09-14 DIAGNOSIS — R350 Frequency of micturition: Secondary | ICD-10-CM | POA: Diagnosis present

## 2022-09-14 DIAGNOSIS — Z79899 Other long term (current) drug therapy: Secondary | ICD-10-CM

## 2022-09-14 DIAGNOSIS — K219 Gastro-esophageal reflux disease without esophagitis: Secondary | ICD-10-CM | POA: Diagnosis present

## 2022-09-14 DIAGNOSIS — R739 Hyperglycemia, unspecified: Secondary | ICD-10-CM | POA: Diagnosis present

## 2022-09-14 DIAGNOSIS — I700 Atherosclerosis of aorta: Secondary | ICD-10-CM | POA: Diagnosis present

## 2022-09-14 DIAGNOSIS — I129 Hypertensive chronic kidney disease with stage 1 through stage 4 chronic kidney disease, or unspecified chronic kidney disease: Secondary | ICD-10-CM | POA: Diagnosis present

## 2022-09-14 DIAGNOSIS — E7800 Pure hypercholesterolemia, unspecified: Secondary | ICD-10-CM | POA: Diagnosis present

## 2022-09-14 DIAGNOSIS — K4030 Unilateral inguinal hernia, with obstruction, without gangrene, not specified as recurrent: Secondary | ICD-10-CM | POA: Diagnosis present

## 2022-09-14 DIAGNOSIS — N179 Acute kidney failure, unspecified: Secondary | ICD-10-CM | POA: Diagnosis present

## 2022-09-14 DIAGNOSIS — Z961 Presence of intraocular lens: Secondary | ICD-10-CM | POA: Diagnosis present

## 2022-09-14 DIAGNOSIS — Z90710 Acquired absence of both cervix and uterus: Secondary | ICD-10-CM | POA: Diagnosis not present

## 2022-09-14 DIAGNOSIS — R6521 Severe sepsis with septic shock: Secondary | ICD-10-CM | POA: Diagnosis present

## 2022-09-14 DIAGNOSIS — Z888 Allergy status to other drugs, medicaments and biological substances status: Secondary | ICD-10-CM

## 2022-09-14 DIAGNOSIS — K449 Diaphragmatic hernia without obstruction or gangrene: Secondary | ICD-10-CM | POA: Diagnosis present

## 2022-09-14 DIAGNOSIS — Z9842 Cataract extraction status, left eye: Secondary | ICD-10-CM

## 2022-09-14 DIAGNOSIS — I471 Supraventricular tachycardia, unspecified: Secondary | ICD-10-CM | POA: Diagnosis not present

## 2022-09-14 DIAGNOSIS — D75839 Thrombocytosis, unspecified: Secondary | ICD-10-CM | POA: Diagnosis present

## 2022-09-14 HISTORY — PX: LAPAROTOMY: SHX154

## 2022-09-14 LAB — HEPATIC FUNCTION PANEL
ALT: 24 U/L (ref 0–44)
AST: 28 U/L (ref 15–41)
Albumin: 3.7 g/dL (ref 3.5–5.0)
Alkaline Phosphatase: 66 U/L (ref 38–126)
Bilirubin, Direct: 0.1 mg/dL (ref 0.0–0.2)
Indirect Bilirubin: 0.7 mg/dL (ref 0.3–0.9)
Total Bilirubin: 0.8 mg/dL (ref 0.3–1.2)
Total Protein: 6.4 g/dL — ABNORMAL LOW (ref 6.5–8.1)

## 2022-09-14 LAB — LACTIC ACID, PLASMA
Lactic Acid, Venous: 3.9 mmol/L (ref 0.5–1.9)
Lactic Acid, Venous: 2.9 mmol/L (ref 0.5–1.9)
Lactic Acid, Venous: 2.9 mmol/L (ref 0.5–1.9)
Lactic Acid, Venous: 3 mmol/L (ref 0.5–1.9)
Lactic Acid, Venous: 4 mmol/L (ref 0.5–1.9)

## 2022-09-14 LAB — CBC WITH DIFFERENTIAL/PLATELET
Abs Immature Granulocytes: 0.14 10*3/uL — ABNORMAL HIGH (ref 0.00–0.07)
Basophils Absolute: 0.2 10*3/uL — ABNORMAL HIGH (ref 0.0–0.1)
Basophils Relative: 1 %
Eosinophils Absolute: 0.1 10*3/uL (ref 0.0–0.5)
Eosinophils Relative: 0 %
HCT: 41.8 % (ref 36.0–46.0)
Hemoglobin: 13.8 g/dL (ref 12.0–15.0)
Immature Granulocytes: 1 %
Lymphocytes Relative: 5 %
Lymphs Abs: 1.4 10*3/uL (ref 0.7–4.0)
MCH: 33.1 pg (ref 26.0–34.0)
MCHC: 33 g/dL (ref 30.0–36.0)
MCV: 100.2 fL — ABNORMAL HIGH (ref 80.0–100.0)
Monocytes Absolute: 0.9 10*3/uL (ref 0.1–1.0)
Monocytes Relative: 3 %
Neutro Abs: 24.7 10*3/uL — ABNORMAL HIGH (ref 1.7–7.7)
Neutrophils Relative %: 90 %
Platelets: 530 10*3/uL — ABNORMAL HIGH (ref 150–400)
RBC: 4.17 MIL/uL (ref 3.87–5.11)
RDW: 12.2 % (ref 11.5–15.5)
Smear Review: NORMAL
WBC: 27.4 10*3/uL — ABNORMAL HIGH (ref 4.0–10.5)
nRBC: 0 % (ref 0.0–0.2)

## 2022-09-14 LAB — URINALYSIS, ROUTINE W REFLEX MICROSCOPIC
Bacteria, UA: NONE SEEN
Bilirubin Urine: NEGATIVE
Glucose, UA: NEGATIVE mg/dL
Hgb urine dipstick: NEGATIVE
Ketones, ur: NEGATIVE mg/dL
Nitrite: POSITIVE — AB
Protein, ur: 30 mg/dL — AB
Specific Gravity, Urine: >1.046 — ABNORMAL HIGH (ref 1.005–1.030)
pH: 6 (ref 5.0–8.0)

## 2022-09-14 LAB — C-REACTIVE PROTEIN: CRP: 10.8 mg/dL — ABNORMAL HIGH (ref <1.0)

## 2022-09-14 LAB — TYPE AND SCREEN
ABO/RH(D): A POS
Antibody Screen: NEGATIVE

## 2022-09-14 LAB — BASIC METABOLIC PANEL
Anion gap: 13 (ref 5–15)
Anion gap: 12 (ref 5–15)
BUN: 26 mg/dL — ABNORMAL HIGH (ref 8–23)
BUN: 29 mg/dL — ABNORMAL HIGH (ref 8–23)
CO2: 19 mmol/L — ABNORMAL LOW (ref 22–32)
CO2: 18 mmol/L — ABNORMAL LOW (ref 22–32)
Calcium: 9.3 mg/dL (ref 8.9–10.3)
Calcium: 8.8 mg/dL — ABNORMAL LOW (ref 8.9–10.3)
Chloride: 104 mmol/L (ref 98–111)
Chloride: 104 mmol/L (ref 98–111)
Creatinine, Ser: 1.54 mg/dL — ABNORMAL HIGH (ref 0.44–1.00)
Creatinine, Ser: 1.24 mg/dL — ABNORMAL HIGH (ref 0.44–1.00)
GFR, Estimated: 32 mL/min — ABNORMAL LOW (ref >60)
GFR, Estimated: 42 mL/min — ABNORMAL LOW (ref >60)
Glucose, Bld: 172 mg/dL — ABNORMAL HIGH (ref 70–99)
Glucose, Bld: 150 mg/dL — ABNORMAL HIGH (ref 70–99)
Potassium: 3.5 mmol/L (ref 3.5–5.1)
Potassium: 4.2 mmol/L (ref 3.5–5.1)
Sodium: 136 mmol/L (ref 135–145)
Sodium: 134 mmol/L — ABNORMAL LOW (ref 135–145)

## 2022-09-14 LAB — SEDIMENTATION RATE: Sed Rate: 8 mm/hr (ref 0–30)

## 2022-09-14 LAB — AMMONIA: Ammonia: <10 umol/L (ref 9–35)

## 2022-09-14 LAB — ABO/RH: ABO/RH(D): A POS

## 2022-09-14 LAB — PROCALCITONIN: Procalcitonin: 5.55 ng/mL

## 2022-09-14 LAB — LIPASE, BLOOD: Lipase: 116 U/L — ABNORMAL HIGH (ref 11–51)

## 2022-09-14 LAB — GLUCOSE, CAPILLARY: Glucose-Capillary: 114 mg/dL — ABNORMAL HIGH (ref 70–99)

## 2022-09-14 LAB — MAGNESIUM: Magnesium: 2.9 mg/dL — ABNORMAL HIGH (ref 1.7–2.4)

## 2022-09-14 SURGERY — LAPAROTOMY, EXPLORATORY
Anesthesia: General

## 2022-09-14 MED ORDER — LACTATED RINGERS IV SOLN: INTRAVENOUS | Status: DC | PRN

## 2022-09-14 MED ORDER — FENTANYL CITRATE (PF) 100 MCG/2ML IJ SOLN
INTRAMUSCULAR | Status: AC
  Filled 2022-09-14: qty 2

## 2022-09-14 MED ORDER — PIPERACILLIN-TAZOBACTAM 3.375 G IVPB
3.3750 g | Freq: Three times a day (TID) | INTRAVENOUS | Status: DC
  Administered 2022-09-15 – 2022-09-19 (×14): 3.375 g via INTRAVENOUS
  Filled 2022-09-14 (×14): qty 50

## 2022-09-14 MED ORDER — ACETAMINOPHEN 10 MG/ML IV SOLN
INTRAVENOUS | Status: AC
  Filled 2022-09-14: qty 100

## 2022-09-14 MED ORDER — SODIUM CHLORIDE 0.9 % IV SOLN: INTRAVENOUS | Status: DC

## 2022-09-14 MED ORDER — LACTATED RINGERS IV BOLUS
1000.0000 mL | Freq: Once | INTRAVENOUS | Status: AC
  Administered 2022-09-14: 1000 mL via INTRAVENOUS

## 2022-09-14 MED ORDER — HYDRALAZINE HCL 20 MG/ML IJ SOLN: 10.0000 mg | Freq: Four times a day (QID) | INTRAMUSCULAR | Status: DC | PRN

## 2022-09-14 MED ORDER — METRONIDAZOLE 500 MG/100ML IV SOLN
500.0000 mg | Freq: Once | INTRAVENOUS | Status: AC
  Administered 2022-09-14: 500 mg via INTRAVENOUS
  Filled 2022-09-14: qty 100

## 2022-09-14 MED ORDER — HYDROMORPHONE HCL 1 MG/ML IJ SOLN
0.5000 mg | INTRAMUSCULAR | Status: DC | PRN
  Administered 2022-09-14 (×2): 0.5 mg via INTRAVENOUS
  Filled 2022-09-14 (×3): qty 0.5

## 2022-09-14 MED ORDER — LIDOCAINE HCL (CARDIAC) PF 100 MG/5ML IV SOSY
PREFILLED_SYRINGE | INTRAVENOUS | Status: DC | PRN
  Administered 2022-09-14: 60 mg via INTRAVENOUS

## 2022-09-14 MED ORDER — CEFAZOLIN SODIUM-DEXTROSE 2-3 GM-%(50ML) IV SOLR
INTRAVENOUS | Status: DC | PRN
  Administered 2022-09-14: 2 g via INTRAVENOUS

## 2022-09-14 MED ORDER — SODIUM CHLORIDE 0.9 % IV SOLN
2.0000 g | Freq: Once | INTRAVENOUS | Status: AC
  Administered 2022-09-14: 2 g via INTRAVENOUS
  Filled 2022-09-14: qty 12.5

## 2022-09-14 MED ORDER — ONDANSETRON HCL 4 MG PO TABS
4.0000 mg | ORAL_TABLET | Freq: Four times a day (QID) | ORAL | Status: DC | PRN
  Administered 2022-09-17: 4 mg via ORAL
  Filled 2022-09-14: qty 1

## 2022-09-14 MED ORDER — SUGAMMADEX SODIUM 200 MG/2ML IV SOLN
INTRAVENOUS | Status: DC | PRN
  Administered 2022-09-14 (×2): 50 mg via INTRAVENOUS
  Administered 2022-09-14: 100 mg via INTRAVENOUS

## 2022-09-14 MED ORDER — ROCURONIUM BROMIDE 100 MG/10ML IV SOLN
INTRAVENOUS | Status: DC | PRN
  Administered 2022-09-14: 30 mg via INTRAVENOUS

## 2022-09-14 MED ORDER — PROMETHAZINE HCL 25 MG/ML IJ SOLN: 6.2500 mg | INTRAMUSCULAR | Status: DC | PRN

## 2022-09-14 MED ORDER — HYDROMORPHONE HCL 1 MG/ML IJ SOLN
1.0000 mg | INTRAMUSCULAR | Status: DC | PRN
  Administered 2022-09-14 – 2022-09-16 (×5): 1 mg via INTRAVENOUS
  Filled 2022-09-14 (×5): qty 1

## 2022-09-14 MED ORDER — 0.9 % SODIUM CHLORIDE (POUR BTL) OPTIME
TOPICAL | Status: DC | PRN
  Administered 2022-09-14: 7000 mL

## 2022-09-14 MED ORDER — ALBUTEROL SULFATE (2.5 MG/3ML) 0.083% IN NEBU: 2.5000 mg | INHALATION_SOLUTION | RESPIRATORY_TRACT | Status: DC | PRN

## 2022-09-14 MED ORDER — PHENYLEPHRINE HCL-NACL 20-0.9 MG/250ML-% IV SOLN
INTRAVENOUS | Status: DC | PRN
  Administered 2022-09-14: 50 ug/min via INTRAVENOUS

## 2022-09-14 MED ORDER — ACETAMINOPHEN 325 MG PO TABS
650.0000 mg | ORAL_TABLET | Freq: Four times a day (QID) | ORAL | Status: DC | PRN
  Administered 2022-09-17 – 2022-09-21 (×3): 650 mg via ORAL
  Filled 2022-09-14 (×3): qty 2

## 2022-09-14 MED ORDER — SUCCINYLCHOLINE CHLORIDE 200 MG/10ML IV SOSY
PREFILLED_SYRINGE | INTRAVENOUS | Status: DC | PRN
  Administered 2022-09-14: 100 mg via INTRAVENOUS

## 2022-09-14 MED ORDER — PROPOFOL 10 MG/ML IV BOLUS
INTRAVENOUS | Status: DC | PRN
  Administered 2022-09-14: 80 mg via INTRAVENOUS

## 2022-09-14 MED ORDER — SODIUM CHLORIDE 0.9 % IV BOLUS
1000.0000 mL | Freq: Once | INTRAVENOUS | Status: AC
  Administered 2022-09-14: 1000 mL via INTRAVENOUS

## 2022-09-14 MED ORDER — LACTATED RINGERS IV SOLN: INTRAVENOUS | Status: DC

## 2022-09-14 MED ORDER — HYDROMORPHONE HCL 1 MG/ML IJ SOLN
0.5000 mg | Freq: Once | INTRAMUSCULAR | Status: AC
  Administered 2022-09-14: 0.5 mg via INTRAVENOUS
  Filled 2022-09-14: qty 0.5

## 2022-09-14 MED ORDER — IOHEXOL 300 MG/ML  SOLN
75.0000 mL | Freq: Once | INTRAMUSCULAR | Status: AC | PRN
  Administered 2022-09-14: 75 mL via INTRAVENOUS

## 2022-09-14 MED ORDER — PHENYLEPHRINE HCL (PRESSORS) 10 MG/ML IV SOLN
INTRAVENOUS | Status: DC | PRN
  Administered 2022-09-14: 80 ug via INTRAVENOUS
  Administered 2022-09-14 (×3): 160 ug via INTRAVENOUS

## 2022-09-14 MED ORDER — SODIUM CHLORIDE 0.9 % IV SOLN
INTRAVENOUS | Status: DC | PRN
  Administered 2022-09-14: 70 mL

## 2022-09-14 MED ORDER — SODIUM CHLORIDE 0.9 % IV SOLN: 2.0000 g | INTRAVENOUS | Status: DC

## 2022-09-14 MED ORDER — LIDOCAINE HCL (PF) 2 % IJ SOLN
INTRAMUSCULAR | Status: AC
  Filled 2022-09-14: qty 5

## 2022-09-14 MED ORDER — ACETAMINOPHEN 10 MG/ML IV SOLN
INTRAVENOUS | Status: DC | PRN
  Administered 2022-09-14: 1000 mg via INTRAVENOUS

## 2022-09-14 MED ORDER — BUPIVACAINE-EPINEPHRINE (PF) 0.5% -1:200000 IJ SOLN
INTRAMUSCULAR | Status: DC | PRN
  Administered 2022-09-14: 30 mL via PERINEURAL

## 2022-09-14 MED ORDER — PROPOFOL 10 MG/ML IV BOLUS
INTRAVENOUS | Status: AC
  Filled 2022-09-14: qty 20

## 2022-09-14 MED ORDER — HYDROMORPHONE HCL 1 MG/ML IJ SOLN
0.2500 mg | INTRAMUSCULAR | Status: DC | PRN
  Administered 2022-09-14: 0.25 mg via INTRAVENOUS

## 2022-09-14 MED ORDER — ACETAMINOPHEN 650 MG RE SUPP: 650.0000 mg | Freq: Four times a day (QID) | RECTAL | Status: DC | PRN

## 2022-09-14 MED ORDER — BUPIVACAINE-EPINEPHRINE (PF) 0.5% -1:200000 IJ SOLN
INTRAMUSCULAR | Status: AC
  Filled 2022-09-14: qty 10

## 2022-09-14 MED ORDER — HEPARIN SODIUM (PORCINE) 5000 UNIT/ML IJ SOLN
5000.0000 [IU] | Freq: Three times a day (TID) | INTRAMUSCULAR | Status: DC
  Administered 2022-09-14 – 2022-09-16 (×5): 5000 [IU] via SUBCUTANEOUS
  Filled 2022-09-14 (×5): qty 1

## 2022-09-14 MED ORDER — SODIUM CHLORIDE (PF) 0.9 % IJ SOLN
INTRAMUSCULAR | Status: AC
  Filled 2022-09-14: qty 50

## 2022-09-14 MED ORDER — SUCCINYLCHOLINE CHLORIDE 200 MG/10ML IV SOSY
PREFILLED_SYRINGE | INTRAVENOUS | Status: AC
  Filled 2022-09-14: qty 10

## 2022-09-14 MED ORDER — SODIUM CHLORIDE (PF) 0.9 % IJ SOLN
INTRAMUSCULAR | Status: AC
  Filled 2022-09-14: qty 10

## 2022-09-14 MED ORDER — FENTANYL CITRATE (PF) 100 MCG/2ML IJ SOLN
INTRAMUSCULAR | Status: DC | PRN
  Administered 2022-09-14: 50 ug via INTRAVENOUS
  Administered 2022-09-14: 100 ug via INTRAVENOUS

## 2022-09-14 MED ORDER — BUPIVACAINE-EPINEPHRINE (PF) 0.5% -1:200000 IJ SOLN
INTRAMUSCULAR | Status: AC
  Filled 2022-09-14: qty 20

## 2022-09-14 MED ORDER — IRRISEPT - 450ML BOTTLE WITH 0.05% CHG IN STERILE WATER, USP 99.95% OPTIME
TOPICAL | Status: DC | PRN
  Administered 2022-09-14: 450 mL

## 2022-09-14 MED ORDER — DEXAMETHASONE SODIUM PHOSPHATE 10 MG/ML IJ SOLN
INTRAMUSCULAR | Status: DC | PRN
  Administered 2022-09-14: 10 mg via INTRAVENOUS

## 2022-09-14 MED ORDER — PHENYLEPHRINE HCL-NACL 20-0.9 MG/250ML-% IV SOLN
INTRAVENOUS | Status: AC
  Filled 2022-09-14: qty 250

## 2022-09-14 MED ORDER — DEXMEDETOMIDINE HCL IN NACL 80 MCG/20ML IV SOLN
INTRAVENOUS | Status: DC | PRN
  Administered 2022-09-14: 8 ug via INTRAVENOUS

## 2022-09-14 MED ORDER — METRONIDAZOLE 500 MG/100ML IV SOLN
500.0000 mg | Freq: Two times a day (BID) | INTRAVENOUS | Status: DC
  Administered 2022-09-14: 500 mg via INTRAVENOUS
  Filled 2022-09-14: qty 100

## 2022-09-14 MED ORDER — ONDANSETRON HCL 4 MG/2ML IJ SOLN
INTRAMUSCULAR | Status: DC | PRN
  Administered 2022-09-14: 4 mg via INTRAVENOUS

## 2022-09-14 MED ORDER — ONDANSETRON HCL 4 MG/2ML IJ SOLN
4.0000 mg | Freq: Four times a day (QID) | INTRAMUSCULAR | Status: DC | PRN
  Administered 2022-09-15 – 2022-09-16 (×2): 4 mg via INTRAVENOUS
  Filled 2022-09-14 (×3): qty 2

## 2022-09-14 MED ORDER — DROPERIDOL 2.5 MG/ML IJ SOLN: 0.6250 mg | Freq: Once | INTRAMUSCULAR | Status: DC | PRN

## 2022-09-14 MED ORDER — BUPIVACAINE LIPOSOME 1.3 % IJ SUSP
INTRAMUSCULAR | Status: AC
  Filled 2022-09-14: qty 20

## 2022-09-14 MED ORDER — HYDROMORPHONE HCL 1 MG/ML IJ SOLN
INTRAMUSCULAR | Status: AC
  Filled 2022-09-14: qty 1

## 2022-09-14 SURGICAL SUPPLY — 49 items
APL PRP STRL LF DISP 70% ISPRP (MISCELLANEOUS)
CHLORAPREP W/TINT 26 (MISCELLANEOUS) ×1 IMPLANT
DRAIN CHANNEL JP 15F RND 16 (MISCELLANEOUS) IMPLANT
DRAIN JACKSON PRATT 10MM FLAT (MISCELLANEOUS) IMPLANT
DRAPE LAPAROTOMY 100X77 ABD (DRAPES) ×1 IMPLANT
DRSG OPSITE POSTOP 4X10 (GAUZE/BANDAGES/DRESSINGS) IMPLANT
DRSG OPSITE POSTOP 4X8 (GAUZE/BANDAGES/DRESSINGS) IMPLANT
ELECT BLADE 6.5 EXT (BLADE) ×1 IMPLANT
ELECT CAUTERY BLADE 6.4 (BLADE) ×1 IMPLANT
ELECT REM PT RETURN 9FT ADLT (ELECTROSURGICAL) ×1
ELECTRODE REM PT RTRN 9FT ADLT (ELECTROSURGICAL) ×1 IMPLANT
GAUZE SPONGE 4X4 12PLY STRL (GAUZE/BANDAGES/DRESSINGS) IMPLANT
GLOVE BIO SURGEON STRL SZ 6.5 (GLOVE) ×1 IMPLANT
GLOVE BIOGEL PI IND STRL 6.5 (GLOVE) ×1 IMPLANT
GOWN STRL REUS W/ TWL LRG LVL3 (GOWN DISPOSABLE) ×2 IMPLANT
GOWN STRL REUS W/TWL LRG LVL3 (GOWN DISPOSABLE) ×2
JET LAVAGE IRRISEPT WOUND (IRRIGATION / IRRIGATOR) ×1
KIT TURNOVER KIT A (KITS) ×1 IMPLANT
LABEL OR SOLS (LABEL) ×1 IMPLANT
LAVAGE JET IRRISEPT WOUND (IRRIGATION / IRRIGATOR) IMPLANT
LIGASURE IMPACT 36 18CM CVD LR (INSTRUMENTS) IMPLANT
MANIFOLD NEPTUNE II (INSTRUMENTS) ×1 IMPLANT
NDL HYPO 22X1.5 SAFETY MO (MISCELLANEOUS) ×1 IMPLANT
NEEDLE HYPO 22X1.5 SAFETY MO (MISCELLANEOUS) ×1 IMPLANT
NS IRRIG 1000ML POUR BTL (IV SOLUTION) ×1 IMPLANT
PACK BASIN MAJOR ARMC (MISCELLANEOUS) ×1 IMPLANT
PACK COLON CLEAN CLOSURE (MISCELLANEOUS) ×1 IMPLANT
RELOAD PROXIMATE 75MM BLUE (ENDOMECHANICALS) IMPLANT
RELOAD STAPLE 75 3.8 BLU REG (ENDOMECHANICALS) IMPLANT
RETRACTOR WND ALEXIS-O 25 LRG (MISCELLANEOUS) IMPLANT
RTRCTR WOUND ALEXIS O 25CM LRG (MISCELLANEOUS) ×1
SPIKE FLUID TRANSFER (MISCELLANEOUS) IMPLANT
SPONGE T-LAP 18X18 ~~LOC~~+RFID (SPONGE) IMPLANT
STAPLER GUN LINEAR PROX 60 (STAPLE) IMPLANT
STAPLER PROXIMATE 75MM BLUE (STAPLE) IMPLANT
STAPLER SKIN PROX 35W (STAPLE) ×1 IMPLANT
SUT ETHILON 3-0 FS-10 30 BLK (SUTURE) ×1
SUT PDS AB 0 CT1 27 (SUTURE) IMPLANT
SUT SILK 2 0 (SUTURE)
SUT SILK 2-0 18XBRD TIE 12 (SUTURE) IMPLANT
SUT SILK 3-0 (SUTURE) IMPLANT
SUT STRATAFIX PDS 30 CT-1 (SUTURE) IMPLANT
SUT VIC AB 3-0 SH 27 (SUTURE) ×1
SUT VIC AB 3-0 SH 27X BRD (SUTURE) ×1 IMPLANT
SUTURE EHLN 3-0 FS-10 30 BLK (SUTURE) IMPLANT
SYR 20ML LL LF (SYRINGE) ×2 IMPLANT
TRAP FLUID SMOKE EVACUATOR (MISCELLANEOUS) ×1 IMPLANT
TRAY FOLEY MTR SLVR 16FR STAT (SET/KITS/TRAYS/PACK) ×1 IMPLANT
WATER STERILE IRR 500ML POUR (IV SOLUTION) ×1 IMPLANT

## 2022-09-14 NOTE — Anesthesia Procedure Notes (Addendum)
Procedure Name: Intubation Date/Time: 09/14/2022 9:14 PM  Performed by: Omer Jack, CRNAPre-anesthesia Checklist: Patient identified, Patient being monitored, Timeout performed, Emergency Drugs available and Suction available Patient Re-evaluated:Patient Re-evaluated prior to induction Oxygen Delivery Method: Circle system utilized Preoxygenation: Pre-oxygenation with 100% oxygen Induction Type: IV induction, Rapid sequence and Cricoid Pressure applied Ventilation: Mask ventilation without difficulty Laryngoscope Size: 3 and McGraph Grade View: Grade I Tube type: Oral Tube size: 6.5 mm Number of attempts: 1 Airway Equipment and Method: Stylet Placement Confirmation: ETT inserted through vocal cords under direct vision, positive ETCO2 and breath sounds checked- equal and bilateral Secured at: 20 cm Tube secured with: Tape Dental Injury: Teeth and Oropharynx as per pre-operative assessment

## 2022-09-14 NOTE — Progress Notes (Signed)
PHARMACY -  BRIEF ANTIBIOTIC NOTE   Pharmacy has received consult(s) for cefepime from an ED provider.  The patient's profile has been reviewed for ht/wt/allergies/indication/available labs.    One time order(s) placed by MD for Cefepime 2 gm  Further antibiotics/pharmacy consults should be ordered by admitting physician if indicated.                       Thank you, Mareena Cavan A 09/14/2022  8:58 AM

## 2022-09-14 NOTE — Progress Notes (Signed)
Called with abnormal CT ab results  IMPRESSION: 1. Interval development of diffuse pneumoperitoneum and increasing free fluid throughout the abdomen and pelvis, consistent with perforated viscus. Site of perforation may be within the gastric antrum/pylorus, where prominent wall thickening and adjacent extraluminal gas are identified reference images 37-43 of series 2. Surgical consultation recommended. 2. Stable diffuse wall thickening of the distal small bowel and proximal colon consistent with enterocolitis. 3. Trace bilateral pleural effusions. 4. Hiatal hernia. 5.  Aortic Atherosclerosis (ICD10-I70.0).  Surgeon on call Dr Maia Plan contacted and will see patient  I have also alerted critical care and requested critical care consult as well Patient currently with increase pain but has stable vitals.

## 2022-09-14 NOTE — ED Notes (Signed)
Pillow provided for comfort, family at bedside. WCTM.

## 2022-09-14 NOTE — ED Triage Notes (Signed)
Pt arrives from home via [facility] w/ c/o generalized abd pain x1 hour. Pt had recent hernia repair on R side. Pt given fentanyl en route. VSS, AO4/GCS15

## 2022-09-14 NOTE — Anesthesia Preprocedure Evaluation (Addendum)
Anesthesia Evaluation  Patient identified by MRN, date of birth, ID band Patient awake    Reviewed: Allergy & Precautions, H&P , NPO status , Patient's Chart, lab work & pertinent test results  Airway Mallampati: II  TM Distance: >3 FB Neck ROM: full    Dental no notable dental hx.    Pulmonary neg pulmonary ROS   Pulmonary exam normal        Cardiovascular Exercise Tolerance: Good hypertension, Pt. on medications Normal cardiovascular exam     Neuro/Psych negative neurological ROS  negative psych ROS   GI/Hepatic Neg liver ROS,GERD  ,,HH GI perforation. NG tube placed   Endo/Other  negative endocrine ROS    Renal/GU Renal disease (AKI)     Musculoskeletal   Abdominal  (+)  Abdomen: soft and tender.   Peds  Hematology negative hematology ROS (+)   Anesthesia Other Findings GI perforation with SIRS. Tachycardia, elevated WBC, lactic acidosis  Past Medical History: No date: Arrhythmia No date: Chronic kidney disease     Comment:  UTI No date: GERD (gastroesophageal reflux disease) No date: Glaucoma (increased eye pressure) No date: Hypercholesterolemia No date: Hypertension  Past Surgical History: No date: ABDOMINAL HYSTERECTOMY 06/11/2015: BREAST BIOPSY; Right     Comment:  Procedure: BREAST BIOPSY WITH NEEDLE LOCALIZATION;                Surgeon: Nadeen Landau, MD;  Location: [facility] ORS;                Service: General;  Laterality: Right; 06/11/2015: BREAST EXCISIONAL BIOPSY; Right     Comment:  papilloma No date: BREAST SURGERY; Right     Comment:  Breast Needle Biopsy No date: CHOLECYSTECTOMY No date: DILATION AND CURETTAGE OF UTERUS No date: EYE SURGERY; Bilateral     Comment:  Cataract Extraction with IOL 09/04/2022: INGUINAL HERNIA REPAIR; Right     Comment:  Procedure: HERNIA REPAIR INGUINAL ADULT;  Surgeon:               Leafy Ro, MD;  Location: [facility] ORS;  Service:                General;  Laterality: Right; No date: LAPAROSCOPIC OOPHORECTOMY  BMI    Body Mass Index: 21.23 kg/m      Reproductive/Obstetrics negative OB ROS                              Anesthesia Physical Anesthesia Plan  ASA: 4 and emergent  Anesthesia Plan: General ETT   Post-op Pain Management: Regional block*, Ketamine IV* and Ofirmev IV (intra-op)*   Induction: Intravenous  PONV Risk Score and Plan: 4 or greater and Ondansetron, Dexamethasone, Midazolam and Droperidol  Airway Management Planned: Oral ETT  Additional Equipment:   Intra-op Plan:   Post-operative Plan: Extubation in OR  Informed Consent: I have reviewed the patients History and Physical, chart, labs and discussed the procedure including the risks, benefits and alternatives for the proposed anesthesia with the patient or authorized representative who has indicated his/her understanding and acceptance.     Dental Advisory Given  Plan Discussed with: CRNA and Surgeon  Anesthesia Plan Comments:          Anesthesia Quick Evaluation

## 2022-09-14 NOTE — Consult Note (Signed)
Pharmacy Antibiotic Note  Felicia Hughes is a 89 y.o. female admitted on 09/14/2022 with  intra-abdominal infection .  Pharmacy has been consulted for Zosyn dosing.  Plan: Zosyn 3.375g IV q8h (4 hour infusion).  Height: 5' (152.4 cm) Weight: 49.3 kg (108 lb 11.2 oz) IBW/kg (Calculated) : 45.5  Temp (24hrs), Avg:97.7 F (36.5 C), Min:97.2 F (36.2 C), Max:98 F (36.7 C)  Recent Labs  Lab 09/14/22 0750 09/14/22 0754 09/14/22 0949 09/14/22 1429 09/14/22 1615 09/14/22 2045 09/14/22 2056  WBC  --  27.4*  --   --   --   --   --   CREATININE  --  1.54*  --   --   --   --  1.24*  LATICACIDVEN 3.9*  --  2.9* 2.9* 3.0* 4.0*  --     Estimated Creatinine Clearance: 23 mL/min (A) (by C-G formula based on SCr of 1.24 mg/dL (H)).    Allergies  Allergen Reactions   Nitrofurantoin Hives   Sulfa Antibiotics Rash    Antimicrobials this admission: cefepime 6/24 x 1 Flagyl 6/24 x 1 Zosyn 6/25 >>  Dose adjustments this admission: N/A  Microbiology results: N/A  Thank you for allowing pharmacy to be a part of this patient's care.  Barrie Folk 09/14/2022 11:23 PM

## 2022-09-14 NOTE — Op Note (Signed)
Preoperative diagnosis: Acute abdomen and perforated viscus.   Postoperative diagnosis: Perforated duodenal ulcer.  Procedure: Repair of perforated duodenal ulcer.                      Omental [name]) patch placement  Anesthesia: GETA  Surgeon: Dr. Hazle Quant, MD  Wound Classification: Contaminated  Indications: Patient is a 89 y.o. female developed acute abdomen with free air on computed tomography scan of abdomen.   Findings:  1. Large quantity (1000 mL) of bilious/turbid fluid identified upon entering the abdomen.  2. Proximal duodenal perforation.   Description of procedure: The patient was placed in the supine position and general endotracheal anesthesia was induced. A time-out was completed verifying correct patient, procedure, site, positioning, and implant(s) and/or special equipment prior to beginning this procedure. Preoperative antibiotics were given. A Foley catheter and a nasogastric tube were placed. The abdomen was prepped and draped in the usual sterile fashion. A vertical midline incision was made from xiphoid to just below the umbilicus. This was deepened through the subcutaneous tissues and hemostasis was achieved with electrocautery. The linea alba was identified and incised and the peritoneal cavity entered. The abdomen was explored. Upon entering the abdomen, a large quantity of bilious and turbid fluid was suctioned from the peritoneal cavity. The stomach and duodenum were inspected and palpated and a small anterior perforation of a duodenal ulcer was found just distal to the pylorus. The decision was made to proceed with repair of duodenal ulcer and placement of omental patch.   The subcentimeter perforation was closed with 3-0 vicryl. Four interrupted sutures of 3-0 silk were placed through healthy duodenal tissue in such a fashion as to span the perforation. These were left untied for now. A mobile portion of viable omentum was brought up, placed over the perforation,  and secured in place by tying the previously placed sutures over it in such a manner as to completely close the hole in the duodenum. Care was taken to avoid excess tension.  Hemostasis was checked. The position of the nasogastric tube was verified. The abdomen was copiously lavaged with 6 liters warm saline followed by a liter of Irrisept. A drain was left in the right upper quadrant.  The fascia was closed with a running suture of PDS 0. The skin was closed with skin staples.  The patient tolerated the procedure well and was taken to the postanesthesia care unit in stable condition.   Specimen: None  Complications: None  Estimated Blood Loss: 50 mL

## 2022-09-14 NOTE — ED Notes (Signed)
Pt family requesting information be shared with Dr Everlene Farrier who they endorse was the surgeon for pts hernia repair. Dr Marisa Severin notified.

## 2022-09-14 NOTE — H&P (Signed)
History and Physical    Felicia Germain WUJ:[id number] DOB: 06-21-1933 DOA: 09/14/2022  PCP: Barbette Reichmann, MD  Patient coming from: home  I have personally briefly reviewed patient's old medical records in [facility]  Chief Complaint: abdominal pain with n/v HPI: Felicia Hughes is a 89 y.o. female with medical history significant of CKD, GERD, Hld, HTN, with interim history of recent admission  6/13-6/16 24 with diagnosis of  incarcerated inguinal hernia s/p which patient had open right inguinal hernia repair of note not bowel compromise was noted during procedure.  Patient s/p repair did well and was discharged home. Patient now returns to ED with diffuse abdominal pain over the last 2-3 days  but acutely worse hours pta. Patient notes symptoms were  associated with nausea and one episode of vomiting.Per daughter who aides in history there was also concern re possible opioid induced constipation  and was started on bowel regimen by her surgeon. However, this intervention did not improve her symptoms. Patient  currently notes no associated f/chills/ cough/ blood in stools, Chest pain or sob. She does note increase urinary frequency but noted that this is a chronic issue for her. She notes no dysuria. Daughter does mention that patient has history of recurrent uti and is on prophylactic  antibiotic for this.  Patient currently notes she is still in pain and is requesting pain medication .   ED Course:  Afeb, bp 151/63, hr 95% rr 20  sat 98%  Lactic 3.9, 2.9  Na 136, K 3.5, CL 104, bicarb 19 , glu 172, cr 1.54 ( 0.88) Tp 6.4 Lipase 116 Wbc 27.4, mcv 100.2, plt 530, pmn 24.7   UA: +wbc 11-20 , rbc 21-50  Tx L R 1L , dilaudid 0.5 , cefepime/metronidazole  CT abd/pelvis IMPRESSION: Interval surgical repair of right inguinal hernia, with resolution of small bowel obstruction since prior study. Tiny amount of residual postop free air noted.   Mild diffuse small bowel wall thickening and  mucosal enhancement of ascending and transverse colon, consistent with enterocolitis.   Small amount of ascites. No evidence of abscess.   Stable diffuse biliary ductal dilatation, which may be due to prior cholecystectomy. Recommend correlation with liver function tests, and consider abdomen MRI and MRCP without and with contrast for further evaluation if clinically warranted.   Prior cxr on 6/16 notes:  IMPRESSION: 1. 1.4 cm right upper lobe pulmonary nodule may be infectious, inflammatory or neoplastic. Recommend further evaluation with CT scan of the chest. 2. Hyperinflation, bronchitic changes and probable bronchiectasis in the medial right middle lobe and lingula. Findings suggest the possibility of chronic indolent atypical infection such as MAI.  Review of Systems: As per HPI otherwise 10 point review of systems negative.   Past Medical History:  Diagnosis Date   Arrhythmia    Chronic kidney disease    UTI   GERD (gastroesophageal reflux disease)    Glaucoma (increased eye pressure)    Hypercholesterolemia    Hypertension     Past Surgical History:  Procedure Laterality Date   ABDOMINAL HYSTERECTOMY     BREAST BIOPSY Right 06/11/2015   Procedure: BREAST BIOPSY WITH NEEDLE LOCALIZATION;  Surgeon: Nadeen Landau, MD;  Location: [facility] ORS;  Service: General;  Laterality: Right;   BREAST EXCISIONAL BIOPSY Right 06/11/2015   papilloma   BREAST SURGERY Right    Breast Needle Biopsy   CHOLECYSTECTOMY     DILATION AND CURETTAGE OF UTERUS     EYE SURGERY Bilateral  Cataract Extraction with IOL   INGUINAL HERNIA REPAIR Right 09/04/2022   Procedure: HERNIA REPAIR INGUINAL ADULT;  Surgeon: Leafy Ro, MD;  Location: [facility] ORS;  Service: General;  Laterality: Right;   LAPAROSCOPIC OOPHORECTOMY       reports that she has never smoked. She has never used smokeless tobacco. She reports current alcohol use of about 1.0 standard drink of alcohol per week. She reports  that she does not use drugs.  Allergies  Allergen Reactions   Nitrofurantoin Hives   Sulfa Antibiotics Rash    Family History  Problem Relation Age of Onset   Breast cancer Mother 85       early 59's   Kidney cancer Neg Hx    Kidney disease Neg Hx    Prostate cancer Neg Hx    Bladder Cancer Neg Hx     Prior to Admission medications   Medication Sig Start Date End Date Taking? Authorizing Provider  acetaminophen (TYLENOL) 325 MG tablet Take 2 tablets (650 mg total) by mouth every 8 (eight) hours as needed for mild pain. 09/05/22 10/05/22  Tonna Boehringer, Isami, DO  atorvastatin (LIPITOR) 20 MG tablet Take 20 mg by mouth daily at 6 PM.    [provider]  cholecalciferol (VITAMIN D) 1000 units tablet Take 1,000 Units by mouth daily.    [provider]  docusate sodium (COLACE) 100 MG capsule Take 1 capsule (100 mg total) by mouth 2 (two) times daily as needed for up to 10 days for mild constipation. 09/05/22 09/15/22  Sung Amabile, DO  HYDROcodone-acetaminophen (NORCO) 5-325 MG tablet Take 1 tablet by mouth every 6 (six) hours as needed for up to 15 doses for moderate pain. 09/05/22   Tonna Boehringer, Isami, DO  losartan (COZAAR) 25 MG tablet Take 25 mg by mouth daily. 11/11/18   [provider]  Multiple Vitamins-Minerals (CENTRUM SILVER 50+WOMEN) TABS Take by mouth.    [provider]  Vibegron (GEMTESA) 75 MG TABS Take 1 tablet (75 mg total) by mouth daily. 07/30/22   Stoioff, Verna Czech, MD  zolpidem (AMBIEN) 10 MG tablet Take 10 mg by mouth at bedtime as needed for sleep.    [provider]    Physical Exam: Vitals:   09/14/22 0900 09/14/22 1000 09/14/22 1030 09/14/22 1100  BP: (!) 158/68 (!) 161/80 109/73 (!) 144/81  Pulse: (!) 106 98 97 (!) 101  Resp: (!) 21 15 15 19   Temp:      TempSrc:      SpO2: 98% 97% 97% 97%  Weight:      Height:        Constitutional: NAD, calm, comfortable Vitals:   09/14/22 0900 09/14/22 1000 09/14/22 1030 09/14/22 1100  BP:  (!) 158/68 (!) 161/80 109/73 (!) 144/81  Pulse: (!) 106 98 97 (!) 101  Resp: (!) 21 15 15 19   Temp:      TempSrc:      SpO2: 98% 97% 97% 97%  Weight:      Height:       Eyes: PERRL, lids and conjunctivae normal ENMT: Mucous membranes are moist. Posterior pharynx clear of any exudate or lesions.Normal dentition.  Neck: normal, supple, no masses, no thyromegaly Respiratory: clear to auscultation bilaterally, no wheezing, no crackles. Normal respiratory effort. No accessory muscle use.  Cardiovascular: Regular rate and rhythm, no murmurs / rubs / gallops. No extremity edema. 2+ pedal pulses. No carotid bruits.  Abdomen: diffuse tenderness, no masses palpated. Right lower quad surgical incision  c/d/I, No hepatosplenomegaly. Bowel sounds positive.  Musculoskeletal: no clubbing / cyanosis. No joint deformity upper and lower extremities. Good ROM, no contractures. Normal muscle tone.  Skin: no rashes, lesions, ulcers. No induration Neurologic: CN 2-12 grossly intact. Sensation intact,. Strength 5/5 in all 4.  Psychiatric: Normal judgment and insight. Alert and oriented x 3. Normal mood.    Labs on Admission: I have personally reviewed following labs and imaging studies  CBC: Recent Labs  Lab 09/14/22 0754  WBC 27.4*  NEUTROABS 24.7*  HGB 13.8  HCT 41.8  MCV 100.2*  PLT 530*   Basic Metabolic Panel: Recent Labs  Lab 09/14/22 0754  NA 136  K 3.5  CL 104  CO2 19*  GLUCOSE 172*  BUN 26*  CREATININE 1.54*  CALCIUM 9.3   GFR: Estimated Creatinine Clearance: 18.5 mL/min (A) (by C-G formula based on SCr of 1.54 mg/dL (H)). Liver Function Tests: Recent Labs  Lab 09/14/22 0754  AST 28  ALT 24  ALKPHOS 66  BILITOT 0.8  PROT 6.4*  ALBUMIN 3.7   Recent Labs  Lab 09/14/22 0754  LIPASE 116*   No results for input(s): "AMMONIA" in the last 168 hours. Coagulation Profile: No results for input(s): "INR", "PROTIME" in the last 168 hours. Cardiac Enzymes: No results for  input(s): "CKTOTAL", "CKMB", "CKMBINDEX", "TROPONINI" in the last 168 hours. BNP (last 3 results) No results for input(s): "PROBNP" in the last 8760 hours. HbA1C: No results for input(s): "HGBA1C" in the last 72 hours. CBG: No results for input(s): "GLUCAP" in the last 168 hours. Lipid Profile: No results for input(s): "CHOL", "HDL", "LDLCALC", "TRIG", "CHOLHDL", "LDLDIRECT" in the last 72 hours. Thyroid Function Tests: No results for input(s): "TSH", "T4TOTAL", "FREET4", "T3FREE", "THYROIDAB" in the last 72 hours. Anemia Panel: No results for input(s): "VITAMINB12", "FOLATE", "FERRITIN", "TIBC", "IRON", "RETICCTPCT" in the last 72 hours. Urine analysis:    Component Value Date/Time   COLORURINE AMBER (A) 06/26/2017 1802   APPEARANCEUR Clear 02/07/2019 1556   LABSPEC 1.016 06/26/2017 1802   PHURINE 5.0 06/26/2017 1802   GLUCOSEU Negative 02/07/2019 1556   HGBUR MODERATE (A) 06/26/2017 1802   BILIRUBINUR Negative 02/07/2019 1556   KETONESUR 5 (A) 06/26/2017 1802   PROTEINUR Negative 02/07/2019 1556   PROTEINUR 100 (A) 06/26/2017 1802   NITRITE Negative 02/07/2019 1556   NITRITE NEGATIVE 06/26/2017 1802   LEUKOCYTESUR Negative 02/07/2019 1556    Radiological Exams on Admission: CT ABDOMEN PELVIS W CONTRAST  Result Date: 09/14/2022 CLINICAL DATA:  Severe postop abdominal pain. Recently postop from surgical repair of inguinal hernia and small bowel obstruction. EXAM: CT ABDOMEN AND PELVIS WITH CONTRAST TECHNIQUE: Multidetector CT imaging of the abdomen and pelvis was performed using the standard protocol following bolus administration of intravenous contrast. RADIATION DOSE REDUCTION: This exam was performed according to the departmental dose-optimization program which includes automated exposure control, adjustment of the mA and/or kV according to patient size and/or use of iterative reconstruction technique. CONTRAST:  75mL OMNIPAQUE IOHEXOL 300 MG/ML  SOLN COMPARISON:  09/03/2022  FINDINGS: Lower Chest: No acute findings. Hepatobiliary: No hepatic masses identified. Prior cholecystectomy. Diffuse biliary ductal dilatation is again seen. Pancreas:  No mass or inflammatory changes. Spleen: Within normal limits in size and appearance. Adrenals/Urinary Tract: No suspicious masses identified. No evidence of ureteral calculi or hydronephrosis. Stomach/Bowel: Tiny amount of postop free intraperitoneal air is noted. There has been surgical repair of right inguinal hernia and resolution of small bowel obstruction since prior study. Mild diffuse small bowel  wall thickening is seen as well as wall thickening and mucosal enhancement of the ascending and transverse colon. This is consistent with enterocolitis. Small amount ascites noted, without evidence of abscess. Vascular/Lymphatic: No pathologically enlarged lymph nodes. No acute vascular findings. Reproductive: Prior hysterectomy noted. Adnexal regions are unremarkable in appearance. Other:  None. Musculoskeletal:  No suspicious bone lesions identified. IMPRESSION: Interval surgical repair of right inguinal hernia, with resolution of small bowel obstruction since prior study. Tiny amount of residual postop free air noted. Mild diffuse small bowel wall thickening and mucosal enhancement of ascending and transverse colon, consistent with enterocolitis. Small amount of ascites. No evidence of abscess. Stable diffuse biliary ductal dilatation, which may be due to prior cholecystectomy. Recommend correlation with liver function tests, and consider abdomen MRI and MRCP without and with contrast for further evaluation if clinically warranted. Electronically Signed   By: Danae Orleans M.D.   On: 09/14/2022 09:40    EKG: Independently reviewed.   Assessment/Plan  Acute Enterocolitis -admit to progressive care  - continue with broad spectrum abx  - ivfs  - trend lactic and inflammatory markers  - send stool studies  -surgery consulted noted no  surgical interventions required at this time and rec medical management  - if no improvement consider ID consult   Hx of recent incarcerated inguinal hernia s/p repair  -no bowel injury noted at that time  -per surgery repair noted to be stable    Thrombocytosis -presumed re-active  -continue to monitor   AKI on CKDIIIa associated metabolic acidosis - hold nephrotoxic medication  - ivfs  -strict I/o  -monitor bicarb/metabolic acidosis related to infection/lactic acidosis would expect to improve with treatment of underlying cause  Chronic bilary dilation  -seen on most recent imaging  -noted  unchanged from prior  - stable lfts  - continue to monitor labs   GERD -ppi   HLD -hold statin for now    HTN -hold arb due to aki  - prn hydralazine    DVT prophylaxis: heparin Code Status: full/ as discussed per patient wishes in event of cardiac arrest  Family Communication: DTR Disposition Plan: patient  expected to be admitted greater than 2 midnights  Consults called: consider ID Admission status: progressive   Lurline Del MD [facility]   If 7PM-7AM, please contact night-coverage [email or web address] Password [email or web address]  09/14/2022, 11:10 AM

## 2022-09-14 NOTE — ED Notes (Addendum)
Assisted patient to the restroom. Pt was unable to provide urine sample at this time.

## 2022-09-14 NOTE — ED Notes (Signed)
Pt attempted to urinate but unable to at this time. WCTM.

## 2022-09-14 NOTE — Transfer of Care (Signed)
Immediate Anesthesia Transfer of Care Note  Patient: Felicia Hughes  Procedure(s) Performed: EXPLORATORY LAPAROTOMY, closure of duodenum with omentum patch  Patient Location: PACU  Anesthesia Type:General  Level of Consciousness: drowsy and patient cooperative  Airway & Oxygen Therapy: Patient Spontanous Breathing and Patient connected to face mask oxygen  Post-op Assessment: Report given to RN and Post -op Vital signs reviewed and stable  Post vital signs: Reviewed and stable  Last Vitals:  Vitals Value Taken Time  BP 138/67 09/14/22 2314  Temp 36.2 C 09/14/22 2307  Pulse 106 09/14/22 2314  Resp 15 09/14/22 2314  SpO2 100 % 09/14/22 2314  Vitals shown include unvalidated device data.  Last Pain:  Vitals:   09/14/22 1952  TempSrc:   PainSc: 8          Complications: No notable events documented.

## 2022-09-14 NOTE — ED Provider Notes (Signed)
[facility] Provider Note    Event Date/Time   First MD Initiated Contact with Patient 09/14/22 [phone]     (approximate)   History   Abdominal Pain   HPI  Felicia Hughes is a 89 y.o. female with a history of chronic kidney disease, hypertension, hypercholesterolemia, and GERD who presents with right lower quadrant abdominal pain since yesterday, acutely worsening over the last 1 to 2 hours and associated with nausea and 1 episode of vomiting.  The patient has also had some diarrhea this morning.  She had a surgery 10 days ago to repair an incarcerated right inguinal hernia that was causing a bowel obstruction.  I reviewed the past medical records.  The patient was admitted on 6/13 after CT showed a bowel obstruction due to right inguinal incarcerated hernia and had surgery on 6/14.  She was discharged on 6/16.   Physical Exam   Triage Vital Signs: ED Triage Vitals  Enc Vitals Group     BP 09/14/22 0744 (!) 151/63     Pulse Rate 09/14/22 0744 95     Resp 09/14/22 0744 20     Temp 09/14/22 0744 97.7 F (36.5 C)     Temp Source 09/14/22 0744 Oral     SpO2 09/14/22 0744 98 %     Weight 09/14/22 0747 108 lb 11.2 oz (49.3 kg)     Height 09/14/22 0747 5' (1.524 m)     Head Circumference --      Peak Flow --      Pain Score 09/14/22 0746 10     Pain Loc --      Pain Edu? --      Excl. in GC? --     Most recent vital signs: Vitals:   09/14/22 0900 09/14/22 1000  BP: (!) 158/68 (!) 161/80  Pulse: (!) 106 98  Resp: (!) 21 15  Temp:    SpO2: 98% 97%     General:  Alert and oriented, uncomfortable appearing. CV:  Good peripheral perfusion.  Resp:  Normal effort.  Abd:  No distention.  Soft with mild left and moderate right lower quadrant tenderness.  Right inguinal surgical incision clean, dry, intact. Other:  Dry mucous membranes.  No jaundice or scleral icterus.   ED Results / Procedures / Treatments   Labs (all labs ordered are listed, but  only abnormal results are displayed) Labs Reviewed  BASIC METABOLIC PANEL - Abnormal; Notable for the following components:      Result Value   CO2 19 (*)    Glucose, Bld 172 (*)    BUN 26 (*)    Creatinine, Ser 1.54 (*)    GFR, Estimated 32 (*)    All other components within normal limits  HEPATIC FUNCTION PANEL - Abnormal; Notable for the following components:   Total Protein 6.4 (*)    All other components within normal limits  LIPASE, BLOOD - Abnormal; Notable for the following components:   Lipase 116 (*)    All other components within normal limits  CBC WITH DIFFERENTIAL/PLATELET - Abnormal; Notable for the following components:   WBC 27.4 (*)    MCV 100.2 (*)    Platelets 530 (*)    Neutro Abs 24.7 (*)    Basophils Absolute 0.2 (*)    Abs Immature Granulocytes 0.14 (*)    All other components within normal limits  LACTIC ACID, PLASMA - Abnormal; Notable for the following components:   Lactic Acid, Venous 3.9 (*)  All other components within normal limits  LACTIC ACID, PLASMA - Abnormal; Notable for the following components:   Lactic Acid, Venous 2.9 (*)    All other components within normal limits  URINALYSIS, ROUTINE W REFLEX MICROSCOPIC     EKG    RADIOLOGY  CT abdomen/pelvis: I independently viewed and interpreted the images; there are no dilated bowel loops or any free air or free fluid.  Radiology report indicates enterocolitis.   PROCEDURES:  Critical Care performed: No  Procedures   MEDICATIONS ORDERED IN ED: Medications  metroNIDAZOLE (FLAGYL) IVPB 500 mg (500 mg Intravenous New Bag/Given 09/14/22 0943)  lactated ringers bolus 1,000 mL (has no administration in time range)  HYDROmorphone (DILAUDID) injection 0.5 mg (has no administration in time range)  lactated ringers bolus 1,000 mL (0 mLs Intravenous Stopped 09/14/22 0943)  HYDROmorphone (DILAUDID) injection 0.5 mg (0.5 mg Intravenous Given 09/14/22 0758)  HYDROmorphone (DILAUDID) injection  0.5 mg (0.5 mg Intravenous Given 09/14/22 0814)  iohexol (OMNIPAQUE) 300 MG/ML solution 75 mL (75 mLs Intravenous Contrast Given 09/14/22 0844)  ceFEPIme (MAXIPIME) 2 g in sodium chloride 0.9 % 100 mL IVPB (0 g Intravenous Stopped 09/14/22 0940)     IMPRESSION / MDM / ASSESSMENT AND PLAN / ED COURSE  I reviewed the triage vital signs and the nursing notes.  89 year old female with PMH as noted above and status post incarcerated right inguinal hernia repair 10 days ago presents with recurrent right lower abdominal pain associated with 1 episode of vomiting as well as diarrhea.  Differential diagnosis includes, but is not limited to, ileus, SBO, perforation, abscess, other postsurgical complication, less likely colitis, appendicitis, diverticulitis, gastroenteritis.  We will obtain lab workup, CT abdomen/pelvis, give fluids and reassess.  Patient's presentation is most consistent with acute presentation with potential threat to life or bodily function.  The patient is on the cardiac monitor to evaluate for evidence of arrhythmia and/or significant heart rate changes.  ----------------------------------------- 10:29 AM on 09/14/2022 -----------------------------------------  Lab workup shows elevated lactate and WBC count.  I have ordered empiric antibiotics for intra-abdominal infection.  CT shows evidence of enterocolitis with no obstruction or other postoperative complication.  I consulted and discussed the case with PA Tomasa Blase and Dr. Everlene Farrier from surgery and they are evaluating the patient.    CT also shows chronically dilated bile ducts.  The patient has no right upper quadrant pain and LFTs are normal.  There is no evidence of acute biliary obstruction.  I consulted and discussed the case with Dr. Maisie Fus from the hospitalist service; based on our discussion she agrees to evaluate the patient for admission.   FINAL CLINICAL IMPRESSION(S) / ED DIAGNOSES   Final diagnoses:  Enterocolitis      Rx / DC Orders   ED Discharge Orders     None        Note:  This document was prepared using Dragon voice recognition software and may include unintentional dictation errors.    Dionne Bucy, MD 09/14/22 1030

## 2022-09-14 NOTE — Progress Notes (Signed)
Patient ID: Felicia Hughes, female   DOB: [birth year], 89 y.o.   MRN: [id number]     SURGICAL PROGRESS NOTE   Hospital Day(s): 0.   Interval History: Patient seen and examined.  I was contacted by hospitalist due to CT scan finding of worsening pneumoperitoneum.  Upon arrival at patient bedside patient complaining of severe abdominal pain.  Patient endorses that the pain has been progressively getting worse.  Pain is generalized.  No radiation.  Pain aggravated by moving the abdominal wall.  No alleviating factors.  Patient was admitted today due to enterocolitis.  Upon admission patient with stable vital signs.  There was no fever.  Initial heart rate was in low 90s.  New heart rate now is 108.  Adequate blood pressures.  She had an initial CT scan of the abdomen and pelvis that shows thickening of the large and small intestine with minute free air under the diaphragm.  On admission she had a chest x-ra that showed a 1.4 cm right upper lobe pulmonary nodule may be infectious or neoplastic.  CT scan of the chest was recommended.  Upon doing the CT scan of the chest new increase free air was identified.  It was followed by repeating CT scan of the abdomen and pelvis that showed abundant amount of free air and fluid.  There is thickening in the anterior gastric wall.  This is concerning for PUD with perforation.  I personally evaluated the images.  Patient currently with significant abdominal pain.  Very tender to palpation with guarding.  New lactic acid 3.0, no improvement with IV fluids.  Leukocytosis of 27,000.  Vital signs in last 24 hours: [min-max] current  Temp:  [97.7 F (36.5 C)-98 F (36.7 C)] 98 F (36.7 C) (06/24 1925) Pulse Rate:  [94-108] 108 (06/24 1925) Resp:  [15-24] 20 (06/24 1925) BP: (109-166)/(60-81) 136/72 (06/24 1925) SpO2:  [97 %-100 %] 97 % (06/24 1925) Weight:  [49.3 kg] 49.3 kg (06/24 0747)     Height: 5' (152.4 cm) Weight: 49.3 kg BMI (Calculated): 21.23   Physical Exam:   Constitutional: alert, cooperative, body pain Respiratory: breathing non-labored at rest  Cardiovascular: Mild tachycardia Gastrointestinal: tender with guarding  Labs:     Latest Ref Rng & Units 09/14/2022    7:54 AM 09/05/2022    5:30 AM 09/04/2022    4:09 AM  CBC  WBC 4.0 - 10.5 K/uL 27.4  16.5  12.9   Hemoglobin 12.0 - 15.0 g/dL 40.9  81.1  91.4   Hematocrit 36.0 - 46.0 % 41.8  32.8  36.6   Platelets 150 - 400 K/uL 530  320  346       Latest Ref Rng & Units 09/14/2022    7:54 AM 09/05/2022    5:30 AM 09/04/2022    4:09 AM  CMP  Glucose 70 - 99 mg/dL 782  956  213   BUN 8 - 23 mg/dL 26  15  20    Creatinine 0.44 - 1.00 mg/dL 0.86  5.78  4.69   Sodium 135 - 145 mmol/L 136  137  136   Potassium 3.5 - 5.1 mmol/L 3.5  4.2  4.9   Chloride 98 - 111 mmol/L 104  111  106   CO2 22 - 32 mmol/L 19  21  22    Calcium 8.9 - 10.3 mg/dL 9.3  8.6  8.6   Total Protein 6.5 - 8.1 g/dL 6.4     Total Bilirubin 0.3 - 1.2 mg/dL 0.8  Alkaline Phos 38 - 126 U/L 66     AST 15 - 41 U/L 28     ALT 0 - 44 U/L 24       Imaging studies:  EXAM: CT ABDOMEN AND PELVIS WITHOUT CONTRAST   TECHNIQUE: Multidetector CT imaging of the abdomen and pelvis was performed following the standard protocol without IV contrast.   RADIATION DOSE REDUCTION: This exam was performed according to the departmental dose-optimization program which includes automated exposure control, adjustment of the mA and/or kV according to patient size and/or use of iterative reconstruction technique.   COMPARISON:  09/14/2022 at 8:52 a.m.   FINDINGS: Lower chest: Trace bilateral pleural effusions. Stable hiatal hernia.   Hepatobiliary: Cholecystectomy. Unremarkable unenhanced appearance of the liver.   Pancreas: Unremarkable unenhanced appearance.   Spleen: Unremarkable unenhanced appearance.   Adrenals/Urinary Tract: Residual contrast within the kidneys from earlier CT exam. No acute finding. The adrenals are  unremarkable. Contrast fills the bladder lumen, with no filling defect.   Stomach/Bowel: In the interim since the prior study, diffuse pneumoperitoneum has developed consistent with perforated viscus. Extraluminal gas is seen adjacent to the gastric pylorus, and this could reflect the site of perforation. Surgical consultation is recommended.   Wall thickening of the small and large bowel again identified consistent with enterocolitis. Wall thickening is most pronounced within the distal small bowel and proximal colon, as well as within the gastric antrum and proximal duodenum. No evidence of bowel obstruction or ileus.   Vascular/Lymphatic: Aortic atherosclerosis. No enlarged abdominal or pelvic lymph nodes.   Reproductive: Status post hysterectomy. No adnexal masses.   Other: Diffuse pneumoperitoneum as described above, with increasing free fluid throughout the abdomen and pelvis consistent with perforated viscus. No abdominal wall hernia. Postsurgical changes from right inguinal hernia repair.   Musculoskeletal: No acute or destructive bony abnormalities. Reconstructed images demonstrate no additional findings.   IMPRESSION: 1. Interval development of diffuse pneumoperitoneum and increasing free fluid throughout the abdomen and pelvis, consistent with perforated viscus. Site of perforation may be within the gastric antrum/pylorus, where prominent wall thickening and adjacent extraluminal gas are identified reference images 37-43 of series 2. Surgical consultation recommended. 2. Stable diffuse wall thickening of the distal small bowel and proximal colon consistent with enterocolitis. 3. Trace bilateral pleural effusions. 4. Hiatal hernia. 5.  Aortic Atherosclerosis (ICD10-I70.0).   Critical Value/emergent results were called by telephone at the time of interpretation on 09/14/2022 at 7:38 pm to provider [facility] , who verbally acknowledged these results.      Electronically Signed   By: Sharlet Salina M.D.   On: 09/14/2022 19:39   Assessment/Plan:  89 y.o. female with pneumoperitoneum, complicated by pertinent comorbidities including CKD, GERD, hyperlipidemia, hypertension, recent incarcerated inguinal hernia.  Patient initially admitted with suspected enterocolitis now with acute abdomen and pneumoperitoneum.  CT scan finding concerning for PUD with perforation.  I discussed finding with patient and her daughter.  Due to the patient physical exam with acute abdomen, tachycardia, lactic acidosis, leukocytosis, I discussed with patient and family about recommendation of taking her to the operating room for exploratory laparotomy.  I agree with radiologist that main concern is distal stomach perforation but I would like to evaluate the whole GI tract due to the finding with thickening of the small bowel and large intestine seen on initial CT scan.  Discussed the risks of surgery including bleeding, infection, bowel perforation, injury to adjacent organs such as liver, gallbladder, duodenum, common bile duct, intra-abdominal abscess,  enterocutaneous fistula, obstruction, among others.  The patient reports she understood and agreed to proceed with exploratory laparotomy.  Gae Gallop, MD

## 2022-09-14 NOTE — Consult Note (Addendum)
[facility] SUR[facility]ICAL CON[facility]ULTATION NOTE (initial) - cpt: [phone]   HISTORY OF PRESENT ILLNESS (HPI):  89 y.o. female known to our service following open right incarcerated inguinal hernia repair on 06/14 with Dr Everlene Farrier. She presented to [facility] ED today for evaluation of abdominal pain. Patient reports she has had very mild right sided abdominal pain for a few days now but this acutely worsened a few hours prior to presentation. Nothing seemed to make this better. This was accompanied by nausea, emesis, and diarrhea. No fever, chills, cough, CP, SOB. Unsure of PO intake in the last few days but does sound like this has decreased. Aside from recent right inguinal hernia repair, she is also s/o cholecystectomy, hysterectomy and oophorectomy. Work up in the ED did reveal leukocytosis to 27.4K, Hgb ti 13.8, PLT 530K, AKI with sCr - 1.54, and venous lactate initially 3.9 with repeat now 2.9 after hydration in ED. She did have CT Abdomen/pelvis which showed a few bubble of free air presumably post-surgical as well as edema to the ascending and transverse colon concerning for enterocolitis. She was given cefepime & flagyl in the ED. Plan for admission to hospitalist  Surgery is consulted by emergency medicine physician Dr. Dionne Bucy, MD in this context for evaluation and management of abdominal pain and enterocolitis in setting of recent right inguinal hernia repair.   PAST MEDICAL HISTORY (PMH):  Past Medical History:  Diagnosis Date   Arrhythmia    Chronic kidney disease    UTI   GERD (gastroesophageal reflux disease)    Glaucoma (increased eye pressure)    Hypercholesterolemia    Hypertension      PAST SURGICAL HISTORY (PSH):  Past Surgical History:  Procedure Laterality Date   ABDOMINAL HYSTERECTOMY     BREAST BIOPSY Right 06/11/2015   Procedure: BREAST BIOPSY WITH NEEDLE LOCALIZATION;  Surgeon: Nadeen Landau, MD;  Location: [facility] ORS;  Service: General;  Laterality:  Right;   BREAST EXCISIONAL BIOPSY Right 06/11/2015   papilloma   BREAST SURGERY Right    Breast Needle Biopsy   CHOLECYSTECTOMY     DILATION AND CURETTAGE OF UTERUS     EYE SURGERY Bilateral    Cataract Extraction with IOL   INGUINAL HERNIA REPAIR Right 09/04/2022   Procedure: HERNIA REPAIR INGUINAL ADULT;  Surgeon: Leafy Ro, MD;  Location: [facility] ORS;  Service: General;  Laterality: Right;   LAPAROSCOPIC OOPHORECTOMY       MEDICATIONS:  Prior to Admission medications   Medication Sig Start Date End Date Taking? Authorizing Provider  acetaminophen (TYLENOL) 325 MG tablet Take 2 tablets (650 mg total) by mouth every 8 (eight) hours as needed for mild pain. 09/05/22 10/05/22  Tonna Boehringer, Isami, DO  atorvastatin (LIPITOR) 20 MG tablet Take 20 mg by mouth daily at 6 PM.    [provider]  cholecalciferol (VITAMIN D) 1000 units tablet Take 1,000 Units by mouth daily.    [provider]  docusate sodium (COLACE) 100 MG capsule Take 1 capsule (100 mg total) by mouth 2 (two) times daily as needed for up to 10 days for mild constipation. 09/05/22 09/15/22  Sung Amabile, DO  HYDROcodone-acetaminophen (NORCO) 5-325 MG tablet Take 1 tablet by mouth every 6 (six) hours as needed for up to 15 doses for moderate pain. 09/05/22   Tonna Boehringer, Isami, DO  losartan (COZAAR) 25 MG tablet Take 25 mg by mouth daily. 11/11/18   [provider]  Multiple Vitamins-Minerals (CENTRUM SILVER 50+WOMEN) TABS Take by mouth.  [provider]  Vibegron (GEMTESA) 75 MG TABS Take 1 tablet (75 mg total) by mouth daily. 07/30/22   Stoioff, Verna Czech, MD  zolpidem (AMBIEN) 10 MG tablet Take 10 mg by mouth at bedtime as needed for sleep.    [provider]     ALLERGIES:  Allergies  Allergen Reactions   Nitrofurantoin Hives   Sulfa Antibiotics Rash     SOCIAL HISTORY:  Social History   Socioeconomic History   Marital status: Single    Spouse name: Not on file   Number of children: Not  on file   Years of education: Not on file   Highest education level: Not on file  Occupational History   Not on file  Tobacco Use   Smoking status: Never   Smokeless tobacco: Never  Vaping Use   Vaping Use: Never used  Substance and Sexual Activity   Alcohol use: Yes    Alcohol/week: 1.0 standard drink of alcohol    Types: 1 Glasses of wine per week    Comment: wine daily   Drug use: No   Sexual activity: Yes    Birth control/protection: None  Other Topics Concern   Not on file  Social History Narrative   Not on file   Social Determinants of Health   Financial Resource Strain: Not on file  Food Insecurity: No Food Insecurity (09/03/2022)   Hunger Vital Sign    Worried About Running Out of Food in the Last Year: Never true    Ran Out of Food in the Last Year: Never true  Transportation Needs: No Transportation Needs (09/03/2022)   PRAPARE - Administrator, Civil Service (Medical): No    Lack of Transportation (Non-Medical): No  Physical Activity: Not on file  Stress: Not on file  Social Connections: Not on file  Intimate Partner Violence: Not At Risk (09/03/2022)   Humiliation, Afraid, Rape, and Kick questionnaire    Fear of Current or Ex-Partner: No    Emotionally Abused: No    Physically Abused: No    Sexually Abused: No     FAMILY HISTORY:  Family History  Problem Relation Age of Onset   Breast cancer Mother 42       early 27's   Kidney cancer Neg Hx    Kidney disease Neg Hx    Prostate cancer Neg Hx    Bladder Cancer Neg Hx       REVIEW OF SYSTEMS:  Review of Systems  Constitutional:  Negative for chills and fever.  Respiratory:  Negative for cough and shortness of breath.   Cardiovascular:  Negative for chest pain and palpitations.  Gastrointestinal:  Positive for abdominal pain, diarrhea, nausea and vomiting. Negative for blood in stool and constipation.  Genitourinary:  Negative for dysuria and urgency.  All other systems reviewed and are  negative.   VITAL SIGNS:  Temp:  [97.7 F (36.5 C)] 97.7 F (36.5 C) (06/24 0744) Pulse Rate:  [94-106] 106 (06/24 0900) Resp:  [19-24] 21 (06/24 0900) BP: (138-158)/(60-68) 158/68 (06/24 0900) SpO2:  [97 %-100 %] 98 % (06/24 0900) Weight:  [49.3 kg] 49.3 kg (06/24 0747)     Height: 5' (152.4 cm) Weight: 49.3 kg BMI (Calculated): 21.23   INTAKE/OUTPUT:  No intake/output data recorded.  PHYSICAL EXAM:  Physical Exam Vitals and nursing note reviewed. Exam conducted with a chaperone present.  Constitutional:      General: She is not in acute distress.    Comments:  Patient resting in bed; NAD; granddaughter at bedside  HENT:     Head: Normocephalic and atraumatic.  Eyes:     General: No scleral icterus.    Extraocular Movements: Extraocular movements intact.  Cardiovascular:     Rate and Rhythm: Tachycardia present.     Heart sounds: Normal heart sounds.     Comments: Borderline tachycardia to 100 bpm at bedside  Pulmonary:     Effort: Pulmonary effort is normal. No respiratory distress.  Abdominal:     General: Abdomen is flat and protuberant. A surgical scar is present. There is no distension.     Palpations: Abdomen is soft.     Tenderness: There is abdominal tenderness in the right lower quadrant. There is no guarding or rebound.     Hernia: No hernia is present.     Comments: Abdomen is soft, she has mild right sided abdominal tenderness, non-distended, no rebound/guarding. She is without evidence of peritonitis on exam. Right inguinal incision is CDI with dermabond, no erythema.   Genitourinary:    Comments: Deferred Skin:    General: Skin is warm and dry.     Findings: No erythema.     Comments: Right inguinal incision is CDI with dermabond, no erythema, no drainage   Neurological:     General: No focal deficit present.     Mental Status: She is alert and oriented to person, place, and time.  Psychiatric:        Mood and Affect: Mood normal.        Behavior:  Behavior normal.      Labs:     Latest Ref Rng & Units 09/14/2022    7:54 AM 09/05/2022    5:30 AM 09/04/2022    4:09 AM  CBC  WBC 4.0 - 10.5 K/uL 27.4  16.5  12.9   Hemoglobin 12.0 - 15.0 g/dL 16.1  09.6  04.5   Hematocrit 36.0 - 46.0 % 41.8  32.8  36.6   Platelets 150 - 400 K/uL 530  320  346       Latest Ref Rng & Units 09/14/2022    7:54 AM 09/05/2022    5:30 AM 09/04/2022    4:09 AM  CMP  Glucose 70 - 99 mg/dL 409  811  914   BUN 8 - 23 mg/dL 26  15  20    Creatinine 0.44 - 1.00 mg/dL 7.82  9.56  2.13   Sodium 135 - 145 mmol/L 136  137  136   Potassium 3.5 - 5.1 mmol/L 3.5  4.2  4.9   Chloride 98 - 111 mmol/L 104  111  106   CO2 22 - 32 mmol/L 19  21  22    Calcium 8.9 - 10.3 mg/dL 9.3  8.6  8.6   Total Protein 6.5 - 8.1 g/dL 6.4     Total Bilirubin 0.3 - 1.2 mg/dL 0.8     Alkaline Phos 38 - 126 U/L 66     AST 15 - 41 U/L 28     ALT 0 - 44 U/L 24       Imaging studies:   CT Abdomen/Pelvis (09/14/2022) personally reviewed showing a free small bubbles of pneumoperitoneum though to be secondary to recent inguinal hernia repair, there is edema in the RUQ about the colon, no gross abscess, and radiologist report reviewed below:  IMPRESSION: Interval surgical repair of right inguinal hernia, with resolution of small bowel obstruction since prior study. Tiny amount of residual postop free  air noted.   Mild diffuse small bowel wall thickening and mucosal enhancement of ascending and transverse colon, consistent with enterocolitis.   Small amount of ascites. No evidence of abscess.   Stable diffuse biliary ductal dilatation, which may be due to prior cholecystectomy. Recommend correlation with liver function tests, and consider abdomen MRI and MRCP without and with contrast for further evaluation if clinically warranted.   Assessment/Plan: (ICD-10's: K82.9) 89 y.o. female with abdominal pain, nausea, emesis found to have likely enterocolitis s/p open right inguinal  hernia repair on 06/14   - Fortunately, nothing grossly surgical in nature on work up and right inguinal hernia repair appears intact without evidence of obstruction, perforation, nor abscess. Her examination is without any evidence of peritonitis. Suspect her laboratory changes (AKI, WBC, Lactic Acidosis) are secondary to dehydration from gastrointestinal process.    - Appreciate medicine admission - Agree with IV Abx for colitis - NPO for now; okay sips of water/with medications  - IVF resuscitation - Monitor abdominal examination; on-going bowel fucntion - Pain control prn; antiemetics prn   - Monitor leukocytosis - Monitor lactic acidosis; improving with hydration  - Monitor renal function; AKI - Mobilize as tolerated - Of note, again, biliary ductal dilation is likely sequela of age. LFTs are normal and she is s/p cholecystectomy. No indication to pursue MRCP at this time. - Further management per primary service    All of the above findings and recommendations were discussed with the patient and her family (granddaughter at bedside), and all of their questions were answered to their expressed satisfaction.  Thank you for the opportunity to participate in this patient's care.   -- Lynden Oxford, PA-C [facility] 09/14/2022, 10:02 AM M-F: 7am - 4pm

## 2022-09-15 ENCOUNTER — Encounter: Payer: Self-pay | Admitting: General Surgery

## 2022-09-15 DIAGNOSIS — K5100 Ulcerative (chronic) pancolitis without complications: Secondary | ICD-10-CM | POA: Diagnosis not present

## 2022-09-15 LAB — COMPREHENSIVE METABOLIC PANEL
ALT: 19 U/L (ref 0–44)
AST: 27 U/L (ref 15–41)
Albumin: 2.4 g/dL — ABNORMAL LOW (ref 3.5–5.0)
Alkaline Phosphatase: 47 U/L (ref 38–126)
Anion gap: 10 (ref 5–15)
BUN: 29 mg/dL — ABNORMAL HIGH (ref 8–23)
CO2: 17 mmol/L — ABNORMAL LOW (ref 22–32)
Calcium: 8 mg/dL — ABNORMAL LOW (ref 8.9–10.3)
Chloride: 108 mmol/L (ref 98–111)
Creatinine, Ser: 1.16 mg/dL — ABNORMAL HIGH (ref 0.44–1.00)
GFR, Estimated: 46 mL/min — ABNORMAL LOW (ref >60)
Glucose, Bld: 130 mg/dL — ABNORMAL HIGH (ref 70–99)
Potassium: 4.4 mmol/L (ref 3.5–5.1)
Sodium: 135 mmol/L (ref 135–145)
Total Bilirubin: 0.7 mg/dL (ref 0.3–1.2)
Total Protein: 4.6 g/dL — ABNORMAL LOW (ref 6.5–8.1)

## 2022-09-15 LAB — CBC
HCT: 37.6 % (ref 36.0–46.0)
Hemoglobin: 12.2 g/dL (ref 12.0–15.0)
MCH: 32.7 pg (ref 26.0–34.0)
MCHC: 32.4 g/dL (ref 30.0–36.0)
MCV: 100.8 fL — ABNORMAL HIGH (ref 80.0–100.0)
Platelets: 442 10*3/uL — ABNORMAL HIGH (ref 150–400)
RBC: 3.73 MIL/uL — ABNORMAL LOW (ref 3.87–5.11)
RDW: 12.5 % (ref 11.5–15.5)
WBC: 14.3 10*3/uL — ABNORMAL HIGH (ref 4.0–10.5)
nRBC: 0 % (ref 0.0–0.2)

## 2022-09-15 LAB — MRSA NEXT GEN BY PCR, NASAL: MRSA by PCR Next Gen: NOT DETECTED

## 2022-09-15 LAB — MAGNESIUM: Magnesium: 2.6 mg/dL — ABNORMAL HIGH (ref 1.7–2.4)

## 2022-09-15 LAB — LACTIC ACID, PLASMA: Lactic Acid, Venous: 5.7 mmol/L (ref 0.5–1.9)

## 2022-09-15 MED ORDER — PANTOPRAZOLE INFUSION (NEW) - SIMPLE MED
8.0000 mg/h | INTRAVENOUS | Status: AC
  Administered 2022-09-15 – 2022-09-16 (×4): 8 mg/h via INTRAVENOUS
  Filled 2022-09-15 (×4): qty 100

## 2022-09-15 MED ORDER — PANTOPRAZOLE SODIUM 40 MG IV SOLR: 40.0000 mg | Freq: Two times a day (BID) | INTRAVENOUS | Status: DC

## 2022-09-15 MED ORDER — PANTOPRAZOLE 80MG IVPB - SIMPLE MED
80.0000 mg | Freq: Once | INTRAVENOUS | Status: AC
  Administered 2022-09-15: 80 mg via INTRAVENOUS
  Filled 2022-09-15: qty 100

## 2022-09-15 MED ORDER — ORAL CARE MOUTH RINSE: 15.0000 mL | OROMUCOSAL | Status: DC | PRN

## 2022-09-15 MED ORDER — SODIUM BICARBONATE 8.4 % IV SOLN
INTRAVENOUS | Status: DC
  Filled 2022-09-15: qty 150
  Filled 2022-09-15: qty 1000
  Filled 2022-09-15: qty 150

## 2022-09-15 MED ORDER — SODIUM CHLORIDE 0.9 % IV SOLN: INTRAVENOUS | Status: AC

## 2022-09-15 MED ORDER — CHLORHEXIDINE GLUCONATE CLOTH 2 % EX PADS
6.0000 | MEDICATED_PAD | Freq: Every day | CUTANEOUS | Status: DC
  Administered 2022-09-15 – 2022-09-18 (×4): 6 via TOPICAL

## 2022-09-15 NOTE — Anesthesia Postprocedure Evaluation (Signed)
Anesthesia Post Note  Patient: Felicia Hughes  Procedure(s) Performed: EXPLORATORY LAPAROTOMY, closure of duodenum with omentum patch  Patient location during evaluation: SICU Anesthesia Type: General Level of consciousness: awake, awake and alert and oriented Pain management: pain level controlled Vital Signs Assessment: post-procedure vital signs reviewed and stable Respiratory status: spontaneous breathing and respiratory function stable Cardiovascular status: stable Postop Assessment: no apparent nausea or vomiting Anesthetic complications: no   No notable events documented.   Last Vitals:  Vitals:   09/15/22 0500 09/15/22 0600  BP: (!) 107/44 (!) 109/53  Pulse: 94   Resp: 13 16  Temp:    SpO2: 94%     Last Pain:  Vitals:   09/15/22 0400  TempSrc: Oral  PainSc:                  Karoline Caldwell

## 2022-09-15 NOTE — Progress Notes (Signed)
Patient ID: Felicia Hughes, female   DOB: 1933/05/04, 89 y.o.   MRN: [id number]     SURGICAL PROGRESS NOTE   Hospital Day(s): 1.   Interval History: Patient seen and examined, no acute events or new complaints overnight. Patient reports feeling "pretty well".  As per daughter patient has not been complaining of pain.  No issues overnight.  Vital signs in last 24 hours: [min-max] current  Temp:  [97.2 F (36.2 C)-98.5 F (36.9 C)] 98.5 F (36.9 C) (06/25 0400) Pulse Rate:  [94-117] 94 (06/25 0500) Resp:  [10-28] 16 (06/25 0600) BP: (107-166)/(44-127) 109/53 (06/25 0600) SpO2:  [90 %-100 %] 94 % (06/25 0500) Weight:  [48.8 kg-49.3 kg] 48.8 kg (06/25 0500)     Height: 5' (152.4 cm) Weight: 48.8 kg BMI (Calculated): 21.01   Physical Exam:  Constitutional: alert, cooperative and no distress  Respiratory: breathing non-labored at rest  Cardiovascular: regular rate and sinus rhythm  Gastrointestinal: soft, non-tender, and non-distended  Labs:     Latest Ref Rng & Units 09/15/2022    5:13 AM 09/14/2022    7:54 AM 09/05/2022    5:30 AM  CBC  WBC 4.0 - 10.5 K/uL 14.3  27.4  16.5   Hemoglobin 12.0 - 15.0 g/dL 40.9  81.1  91.4   Hematocrit 36.0 - 46.0 % 37.6  41.8  32.8   Platelets 150 - 400 K/uL 442  530  320       Latest Ref Rng & Units 09/15/2022    5:13 AM 09/14/2022    8:56 PM 09/14/2022    7:54 AM  CMP  Glucose 70 - 99 mg/dL 782  956  213   BUN 8 - 23 mg/dL 29  29  26    Creatinine 0.44 - 1.00 mg/dL 0.86  5.78  4.69   Sodium 135 - 145 mmol/L 135  134  136   Potassium 3.5 - 5.1 mmol/L 4.4  4.2  3.5   Chloride 98 - 111 mmol/L 108  104  104   CO2 22 - 32 mmol/L 17  18  19    Calcium 8.9 - 10.3 mg/dL 8.0  8.8  9.3   Total Protein 6.5 - 8.1 g/dL 4.6   6.4   Total Bilirubin 0.3 - 1.2 mg/dL 0.7   0.8   Alkaline Phos 38 - 126 U/L 47   66   AST 15 - 41 U/L 27   28   ALT 0 - 44 U/L 19   24     Imaging studies: No new pertinent imaging studies   Assessment/Plan:  89 y.o. female with  peptic ulcer disease with perforation 1 Day Post-Op s/p duodenal ulcer repair.  -Stable overnight.  No complications.  Adequate vital signs.  No fever no tachycardia, adequate blood pressure -NGT with minimal output -Drain output 70 cc since surgery serosanguineous -Plan is to continue pain management, IV antibiotic therapy due to severe bilious peritonitis, NGT in place to low intermittent suction, upper GI tomorrow. -PT and OT for mobilization  Gae Gallop, MD

## 2022-09-15 NOTE — Consult Note (Signed)
NAME:  Felicia Hughes, MRN:  [id number], DOB:  July 05, 1933, LOS: 1 ADMISSION DATE:  09/14/2022, CONSULTATION DATE:  09/14/22 REFERRING MD:  Skip Mayer , CHIEF COMPLAINT:  Abdominal Pain    HPI  89 y.o female with significant PMH of CKD, GERD, HLD, HTN, s/p cholecystectomy who presented to the ED with chief complaints of worsening abdominal pain.  On review of chart, patient recently had an open right incarcerated inguinal hernia repair on 6/14 with Dr. Everlene Farrier.  Since discharge she has had very mild right-sided abdominal pain for a few days that worsened on the day of admission with associated nausea, vomiting and diarrhea.   ED Course: Initial vital signs showed HR of 95 beats/minute, BP 151/63 mm Hg, the RR 20 breaths/minute, and the oxygen saturation 98% on RAand a temperature of 97.61F (36.5C).  Lactic 3.9, 2.9  Na+ 136, K+ 3.5, CL 104, bicarb 19 , Glucose 172, Cr. 1.54 ( 0.88) Trop 6.4 Lipase 116 WBC 24.7  UA: +wbc 11-20 , rbc 21-50  Tx L R 1L , dilaudid 0.5 , cefepime/metronidazole General surgery was consulted by EDP who recommended close monitoring with no surgical intervention at the time.  Patient was admitted to hospitalist service for observation.  Hospital Course: Initial CT abdomen with pelvis showing resolution of small bowel obstruction post inguinal hernia repair. Patient complained of worsening abdominal pain repeat CT abdomen pelvis obtained which showed perforated viscus.  General surgery consulted and patient taken urgently to the OR for repair.  PCCM consulted  Past Medical History  CKD, GERD, HLD, HTN, s/p cholecystectomy   Significant Hospital Events   6/24: Admit to hospitalist service with enterocolitis found with acute abdomen and pneumoperitoneum consistent with perforated viscus  Consults:  General surgery  Procedures:  6/24:Acute abdomen and perforated viscus s/p repair   Significant Diagnostic Tests:   6/24: CTA abdomen and  pelvis> IMPRESSION: 1. Interval development of diffuse pneumoperitoneum and increasing free fluid throughout the abdomen and pelvis, consistent with perforated viscus. Site of perforation may be within the gastric antrum/pylorus, where prominent wall thickening and adjacent extraluminal gas are identified reference images 37-43 of series 2. Surgical consultation recommended. 2. Stable diffuse wall thickening of the distal small bowel and proximal colon consistent with enterocolitis. 3. Trace bilateral pleural effusions. 4. Hiatal hernia. 5.  Aortic Atherosclerosis (ICD10-I70.0).  Interim History / Subjective:  -  Micro Data:  6/24: MRSA PCR>> negative  Antimicrobials:  Cefepime 6/24 x 1 Flagyl 6/24 x 1 Zosyn 6/25 >>  OBJECTIVE  Blood pressure (!) 119/56, pulse (!) 105, temperature 97.9 F (36.6 C), temperature source Axillary, resp. rate 13, height 5' (1.524 m), weight 48.8 kg, SpO2 94 %.        Intake/Output Summary (Last 24 hours) at 09/15/2022 0434 Last data filed at 09/15/2022 0400 Gross per 24 hour  Intake 2350 ml  Output 1295 ml  Net 1055 ml   Filed Weights   09/14/22 0747 09/15/22 0030  Weight: 49.3 kg 48.8 kg     Physical Examination  GENERAL: 89 year-old critically ill patient lying in the bed in no acute distress EYES: PEERLA. No scleral icterus. Extraocular muscles intact.  HEENT: Head atraumatic, normocephalic. Oropharynx and nasopharynx clear.  NECK:  No JVD, supple  LUNGS: Normal breath sounds bilaterally.  No use of accessory muscles of respiration.  CARDIOVASCULAR: S1, S2 normal. No murmurs, rubs, or gallops.  ABDOMEN: Soft,. Honey comb dressing to mid abdomen, JP Drain EXTREMITIES: No swelling or erythema.  Capillary refill is less than 3 seconds in all extremities. Pulses palpable distally. NEUROLOGIC: The patient is lethargic post op. No focal neurological deficit.  Cranial nerves are intact.  SKIN: No obvious rash, lesion, or ulcer. Warm to  touch Labs/imaging that I havepersonally reviewed  (right click and "Reselect all SmartList Selections" daily)     Labs   CBC: Recent Labs  Lab 09/14/22 0754  WBC 27.4*  NEUTROABS 24.7*  HGB 13.8  HCT 41.8  MCV 100.2*  PLT 530*    Basic Metabolic Panel: Recent Labs  Lab 09/14/22 0754 09/14/22 1429 09/14/22 2056  NA 136  --  134*  K 3.5  --  4.2  CL 104  --  104  CO2 19*  --  18*  GLUCOSE 172*  --  150*  BUN 26*  --  29*  CREATININE 1.54*  --  1.24*  CALCIUM 9.3  --  8.8*  MG  --  2.9*  --    GFR: Estimated Creatinine Clearance: 23 mL/min (A) (by C-G formula based on SCr of 1.24 mg/dL (H)). Recent Labs  Lab 09/14/22 0754 09/14/22 0949 09/14/22 1429 09/14/22 1615 09/14/22 2045 09/15/22 0010  PROCALCITON  --   --  5.55  --   --   --   WBC 27.4*  --   --   --   --   --   LATICACIDVEN  --    < > 2.9* 3.0* 4.0* 5.7*   < > = values in this interval not displayed.    Liver Function Tests: Recent Labs  Lab 09/14/22 0754  AST 28  ALT 24  ALKPHOS 66  BILITOT 0.8  PROT 6.4*  ALBUMIN 3.7   Recent Labs  Lab 09/14/22 0754  LIPASE 116*   Recent Labs  Lab 09/14/22 1429  AMMONIA <10    ABG No results found for: "PHART", "PCO2ART", "PO2ART", "HCO3", "TCO2", "ACIDBASEDEF", "O2SAT"   Coagulation Profile: No results for input(s): "INR", "PROTIME" in the last 168 hours.  Cardiac Enzymes: No results for input(s): "CKTOTAL", "CKMB", "CKMBINDEX", "TROPONINI" in the last 168 hours.  HbA1C: No results found for: "HGBA1C"  CBG: Recent Labs  Lab 09/14/22 2357  GLUCAP 114*    Review of Systems:   Unable to be obtained secondary to the patient's altered mental status   Past Medical History  She,  has a past medical history of Arrhythmia, Chronic kidney disease, GERD (gastroesophageal reflux disease), Glaucoma (increased eye pressure), Hypercholesterolemia, and Hypertension.   Surgical History    Past Surgical History:  Procedure Laterality Date    ABDOMINAL HYSTERECTOMY     BREAST BIOPSY Right 06/11/2015   Procedure: BREAST BIOPSY WITH NEEDLE LOCALIZATION;  Surgeon: Nadeen Landau, MD;  Location: [facility] ORS;  Service: General;  Laterality: Right;   BREAST EXCISIONAL BIOPSY Right 06/11/2015   papilloma   BREAST SURGERY Right    Breast Needle Biopsy   CHOLECYSTECTOMY     DILATION AND CURETTAGE OF UTERUS     EYE SURGERY Bilateral    Cataract Extraction with IOL   INGUINAL HERNIA REPAIR Right 09/04/2022   Procedure: HERNIA REPAIR INGUINAL ADULT;  Surgeon: Leafy Ro, MD;  Location: [facility] ORS;  Service: General;  Laterality: Right;   LAPAROSCOPIC OOPHORECTOMY      Social History   reports that she has never smoked. She has never used smokeless tobacco. She reports current alcohol use of about 1.0 standard drink of alcohol per week. She reports that she does not  use drugs.   Family History   Her family history includes Breast cancer (age of onset: 19) in her mother. There is no history of Kidney cancer, Kidney disease, Prostate cancer, or Bladder Cancer.   Allergies Allergies  Allergen Reactions   Nitrofurantoin Hives   Sulfa Antibiotics Rash     Home Medications  Prior to Admission medications   Medication Sig Start Date End Date Taking? Authorizing Provider  acetaminophen (TYLENOL) 325 MG tablet Take 2 tablets (650 mg total) by mouth every 8 (eight) hours as needed for mild pain. 09/05/22 10/05/22  Tonna Boehringer, Isami, DO  atorvastatin (LIPITOR) 20 MG tablet Take 20 mg by mouth daily at 6 PM.    [provider]  cholecalciferol (VITAMIN D) 1000 units tablet Take 1,000 Units by mouth daily.    [provider]  docusate sodium (COLACE) 100 MG capsule Take 1 capsule (100 mg total) by mouth 2 (two) times daily as needed for up to 10 days for mild constipation. 09/05/22 09/15/22  Sung Amabile, DO  HYDROcodone-acetaminophen (NORCO) 5-325 MG tablet Take 1 tablet by mouth every 6 (six) hours as needed for up to 15 doses for  moderate pain. 09/05/22   Tonna Boehringer, Isami, DO  losartan (COZAAR) 25 MG tablet Take 25 mg by mouth daily. 11/11/18   [provider]  Multiple Vitamins-Minerals (CENTRUM SILVER 50+WOMEN) TABS Take by mouth.    [provider]  Vibegron (GEMTESA) 75 MG TABS Take 1 tablet (75 mg total) by mouth daily. 07/30/22   Stoioff, Verna Czech, MD  zolpidem (AMBIEN) 10 MG tablet Take 10 mg by mouth at bedtime as needed for sleep.    [provider]  Scheduled Meds:  heparin  5,000 Units Subcutaneous Q8H   [START ON 09/18/2022] pantoprazole  40 mg Intravenous Q12H   Continuous Infusions:  sodium chloride 75 mL/hr at 09/15/22 0006   pantoprazole 8 mg/hr (09/15/22 0129)   piperacillin-tazobactam (ZOSYN)  IV 3.375 g (09/15/22 0042)   PRN Meds:.acetaminophen **OR** acetaminophen, albuterol, hydrALAZINE, HYDROmorphone (DILAUDID) injection, ondansetron **OR** ondansetron (ZOFRAN) IV   Active Hospital Problem list   See below  Assessment & Plan:    #Acute Abdomen in the setting of Perforated Viscus Recent Incarcerated Inguinal Hernia S/P Repair 6/14 Repeat CT abdomen pelvis  6/24 shows with free air and fluid in her abdomen consistent with viscus perforation S/p ex lap  for repai of Perforated Duodenal Ucer POD # 0 -Keep NPO  -Hemodynamic support as needed -NG tube to intermittent suction -IV broad-spectrum as below -Wound care per general surgery -General surgery following   #Sepsis without septic shock secondary to perforated viscus -Supplemental oxygen as needed, to maintain SpO2 > 90% -F/u cultures, trend lactic/ PCT -Monitor WBC/ fever curve -IV antibiotics: zosyn  -IVF hydration as needed -PRN Pressors for MAP goal >65 -Strict I/O's   # AKI on CKD Stage III # Hyponatremia # Mild NAGMA with Lactic Acidosis -Trend Lactate - Monitor I&O's / urinary output - Follow BMP - Ensure adequate renal perfusion - Avoid nephrotoxic agents as able - Replace electrolytes as  indicated   #Hyperglycemia -CBG's q4; Target range of 140 to 180 -SSI -Follow ICU Hypo/Hyperglycemia protocol   #HLD  #HTN -Atorvastatin 20mg  PO at bedtime once able to tolerate po -Resume BP meds as renal and BP permits  Best practice:  Diet:  NPO Pain/Anxiety/Delirium protocol (if indicated): No VAP protocol (if indicated): Not indicated DVT prophylaxis: Contraindicated GI prophylaxis: N/A Glucose control:  SSI No Central venous  access:  N/A Arterial line:  N/A Foley:  Yes, and it is still needed Mobility:  bed rest  PT consulted: N/A Last date of multidisciplinary goals of care discussion [6/24] Code Status:  full code Disposition: ICU   = Goals of Care = Code Status Order: FULL  Primary Emergency ContactShelby Dubin, Home Phone: [phone] Wishes to pursue full aggressive treatment and intervention options, including CPR and intubation,  goals of care will be addressed on going with patient and family if that should become necessary.  Critical care time: 45 minutes        Webb Silversmith DNP, CCRN, FNP-C, AGACNP-BC Acute Care & Family Nurse Practitioner [facility] PCCM on call pager [phone]

## 2022-09-15 NOTE — NC FL2 (Signed)
[place] MEDICAID FL2 LEVEL OF CARE FORM     IDENTIFICATION  Patient Name: Felicia Hughes Birthdate: May 14, 1933 Sex: female Admission Date (Current Location): 09/14/2022  [facility] and [place] Number:  Chiropodist and Address:  [facility], [address], [place], [place] [address]      Provider Number: [id number]  Attending Physician Name and Address:  Enedina Finner, MD  Relative Name and Phone Number:  Lollie Sails  [phone]    Current Level of Care: Hospital Recommended Level of Care: Skilled Nursing Facility Prior Approval Number:    Date Approved/Denied:   PASRR Number: [id number]  Discharge Plan: SNF    Current Diagnoses: Patient Active Problem List   Diagnosis Date Noted   Enterocolitis 09/14/2022   Pancolitis (HCC) 09/14/2022   Incarcerated inguinal hernia, unilateral 09/04/2022   Small bowel obstruction (HCC) 09/03/2022   Glaucoma (increased eye pressure) 06/01/2018   Hyperlipidemia 06/01/2018   Hypertension 06/01/2018   Increased frequency of urination 04/30/2018   Overactive bladder 03/31/2016   Recurrent UTI 06/18/2015   Urge incontinence 05/22/2014    Orientation RESPIRATION BLADDER Height & Weight     Self, Situation, Place  O2 (6L nasal cannula) Continent Weight: 107 lb 9.4 oz (48.8 kg) Height:  5' (152.4 cm)  BEHAVIORAL SYMPTOMS/MOOD NEUROLOGICAL BOWEL NUTRITION STATUS      Continent Diet (see discharge summary)  AMBULATORY STATUS COMMUNICATION OF NEEDS Skin   Limited Assist Verbally Other (Comment) (abdomen closed incision)                       Personal Care Assistance Level of Assistance  Bathing, Feeding, Dressing, Total care Bathing Assistance: Limited assistance Feeding assistance: Independent Dressing Assistance: Limited assistance Total Care Assistance: Limited assistance   Functional Limitations Info  Sight, Hearing, Speech Sight Info: Adequate Hearing Info: Impaired Speech Info: Adequate     SPECIAL CARE FACTORS FREQUENCY  PT (By licensed PT), OT (By licensed OT)     PT Frequency: min 4x weekly OT Frequency: min 4x weekly            Contractures Contractures Info: Not present    Additional Factors Info  Code Status, Allergies Code Status Info: full Allergies Info: nitrofurantoin, sulfa antibiotics           Current Medications (09/15/2022):  This is the current hospital active medication list Current Facility-Administered Medications  Medication Dose Route Frequency Provider Last Rate Last Admin   0.9 %  sodium chloride infusion   Intravenous Continuous Enedina Finner, MD 75 mL/hr at 09/15/22 1422 Restarted at 09/15/22 1422   acetaminophen (TYLENOL) tablet 650 mg  650 mg Oral Q6H PRN Carolan Shiver, MD       Or   acetaminophen (TYLENOL) suppository 650 mg  650 mg Rectal Q6H PRN Carolan Shiver, MD       albuterol (PROVENTIL) (2.5 MG/3ML) 0.083% nebulizer solution 2.5 mg  2.5 mg Nebulization Q2H PRN Carolan Shiver, MD       Chlorhexidine Gluconate Cloth 2 % PADS 6 each  6 each Topical Daily Enedina Finner, MD   6 each at 09/15/22 1122   heparin injection 5,000 Units  5,000 Units Subcutaneous Q8H Carolan Shiver, MD   5,000 Units at 09/15/22 1422   hydrALAZINE (APRESOLINE) injection 10 mg  10 mg Intravenous Q6H PRN Carolan Shiver, MD       HYDROmorphone (DILAUDID) injection 1 mg  1 mg Intravenous Q2H PRN Carolan Shiver, MD   1 mg at  09/15/22 0839   ondansetron (ZOFRAN) tablet 4 mg  4 mg Oral Q6H PRN Carolan Shiver, MD       Or   ondansetron (ZOFRAN) injection 4 mg  4 mg Intravenous Q6H PRN Carolan Shiver, MD   4 mg at 09/15/22 0844   Oral care mouth rinse  15 mL Mouth Rinse PRN Enedina Finner, MD       Melene Muller ON 09/18/2022] pantoprazole (PROTONIX) injection 40 mg  40 mg Intravenous Q12H Carolan Shiver, MD       pantoprozole (PROTONIX) 80 mg /NS 100 mL infusion  8 mg/hr Intravenous Continuous Carolan Shiver, MD 10 mL/hr at 09/15/22 1425 8 mg/hr at 09/15/22 1425   piperacillin-tazobactam (ZOSYN) IVPB 3.375 g  3.375 g Intravenous Q8H Barrie Folk, RPH 12.5 mL/hr at 09/15/22 1121 Infusion Verify at 09/15/22 1121     Discharge Medications: Please see discharge summary for a list of discharge medications.  Relevant Imaging Results:  Relevant Lab Results:   Additional Information SSN: [id number]  [name], LCSW

## 2022-09-15 NOTE — TOC Initial Note (Signed)
Transition of Care [facility]) - Initial/Assessment Note    Patient Details  Name: Felicia Hughes MRN: [id number] Date of Birth: 25-Dec-1933  Transition of Care [facility]) CM/SW Contact:    [name], LCSW Phone Number: 09/15/2022, 2:27 PM  Clinical Narrative:                  CSW notes patient is from [facility] [place], agreeable to go to [place] SNF per PT recommendations. Referral sent.     Expected Discharge Plan: Skilled Nursing Facility Barriers to Discharge: Continued Medical Work up   Patient Goals and CMS Choice Patient states their goals for this hospitalization and ongoing recovery are:: to go home CMS Medicare.gov Compare Post Acute Care list provided to:: Patient Choice offered to / list presented to : Patient      Expected Discharge Plan and Services       Living arrangements for the past 2 months: Assisted Living Facility ([facility])                                      Prior Living Arrangements/Services Living arrangements for the past 2 months: Assisted Living Facility ([facility]) Lives with:: Facility Resident                   Activities of Daily Living Home Assistive Devices/Equipment: Medical laboratory scientific officer (specify quad or straight), Eyeglasses ADL Screening (condition at time of admission) Patient's cognitive ability adequate to safely complete daily activities?: Yes Is the patient deaf or have difficulty hearing?: Yes Does the patient have difficulty seeing, even when wearing glasses/contacts?: No Does the patient have difficulty concentrating, remembering, or making decisions?: No Patient able to express need for assistance with ADLs?: Yes Does the patient have difficulty dressing or bathing?: No Independently performs ADLs?: Yes (appropriate for developmental age) Does the patient have difficulty walking or climbing stairs?: Yes Weakness of Legs: Both Weakness of Arms/Hands: None  Permission Sought/Granted                   Emotional Assessment              Admission diagnosis:  Enterocolitis [K52.9] Pancolitis (HCC) [K51.00] Patient Active Problem List   Diagnosis Date Noted   Enterocolitis 09/14/2022   Pancolitis (HCC) 09/14/2022   Incarcerated inguinal hernia, unilateral 09/04/2022   Small bowel obstruction (HCC) 09/03/2022   Glaucoma (increased eye pressure) 06/01/2018   Hyperlipidemia 06/01/2018   Hypertension 06/01/2018   Increased frequency of urination 04/30/2018   Overactive bladder 03/31/2016   Recurrent UTI 06/18/2015   Urge incontinence 05/22/2014   PCP:  Barbette Reichmann, MD Pharmacy:   [phone], [place] - [address] [place] [address] Phone: [phone] Fax: [phone]     Social Determinants of Health (SDOH) Social History: SDOH Screenings   Food Insecurity: No Food Insecurity (09/14/2022)  Housing: Low Risk  (09/14/2022)  Transportation Needs: No Transportation Needs (09/14/2022)  Utilities: Not At Risk (09/14/2022)  Tobacco Use: Low Risk  (09/15/2022)   SDOH Interventions:     Readmission Risk Interventions    09/05/2022   11:39 AM  Readmission Risk Prevention Plan  Post Dischage Appt Complete  Medication Screening Complete  Transportation Screening Complete

## 2022-09-15 NOTE — Evaluation (Signed)
Physical Therapy Evaluation Patient Details Name: Felicia Hughes MRN: [id number] DOB: 1933/07/22 Today's Date: 09/15/2022  History of Present Illness  89 y.o female with significant PMH of CKD, GERD, HLD, HTN, s/p cholecystectomy who presented to the ED with chief complaints of worsening abdominal pain. Pt had an open right incarcerated inguinal hernia repair on 6/14 and since discharge has had mild right-sided abdominal pain, worsening day of admission with nausea, vomiting, and diarrhea. Repeat imaging showed perforated viscus. PT admitted to hospital service with enterocolitis found with acute abdomen and pneumoperitoneum consistent with perforated viscus. Now s/p exploratory laparotomy and duodenal ulcer repair on 6/24.  Clinical Impression  Pt is a pleasant 89 year old female who was admitted for complaints of abdominal pain. Exploratory laparotomy and duodenal ulcer repaid on 6/24.Marland Kitchen Pt performs bed mobility min assist, transfers min guard, and ambulation with min guard and RW. Patient transferred sit<>stand min guard with RW. Lateral steps to HOB min assist for RW movement. BP taken while sitting EOB 167/73, patient reports no sx. Pt demonstrates deficits with balance, mobility, ROM, strength, ambulation, pain and safe DME  use.Patient left in bed with alarm set, call bell, family and nursing in room. Pt will continue to receive skilled PT services while admitted and will defer to TOC/care team for updates regarding disposition planning.      Recommendations for follow up therapy are one component of a multi-disciplinary discharge planning process, led by the attending physician.  Recommendations may be updated based on patient status, additional functional criteria and insurance authorization.  Follow Up Recommendations       Assistance Recommended at Discharge Intermittent Supervision/Assistance  Patient can return home with the following  A little help with walking and/or transfers;A little  help with bathing/dressing/bathroom;Assist for transportation;Help with stairs or ramp for entrance    Equipment Recommendations Rolling walker (2 wheels)  Recommendations for Other Services       Functional Status Assessment Patient has had a recent decline in their functional status and demonstrates the ability to make significant improvements in function in a reasonable and predictable amount of time.     Precautions / Restrictions Precautions Precautions: Fall Precaution Comments: LLQ JP bulb drain, abdominal incision, NG tube Restrictions Weight Bearing Restrictions: No      Mobility  Bed Mobility Overal bed mobility: Needs Assistance Bed Mobility: Supine to Sit, Sit to Supine     Supine to sit: Min assist, HOB elevated Sit to supine: Min assist   General bed mobility comments: increased time/effort to complete. Cueing needed fo sequencing.    Transfers Overall transfer level: Needs assistance Equipment used: Rolling walker (2 wheels) Transfers: Sit to/from Stand Sit to Stand: Min guard           General transfer comment: VC for RW mgmt, scooting HOB.    Ambulation/Gait Ambulation/Gait assistance: Min assist Gait Distance (Feet): 2 Feet Assistive device: Rolling walker (2 wheels) Gait Pattern/deviations: Step-to pattern, Narrow base of support, Trunk flexed       General Gait Details: Lateral stepping to HOB.  Stairs            Wheelchair Mobility    Modified Rankin (Stroke Patients Only)       Balance Overall balance assessment: Needs assistance Sitting-balance support: Bilateral upper extremity supported, Feet supported Sitting balance-Leahy Scale: Fair     Standing balance support: Bilateral upper extremity supported, During functional activity, Reliant on assistive device for balance Standing balance-Leahy Scale: Fair  Pertinent Vitals/Pain Pain Assessment Pain Assessment: 0-10 Pain  Score: 2  Pain Location: abdomen Pain Descriptors / Indicators: Aching Pain Intervention(s): Limited activity within patient's tolerance, Monitored during session    Home Living Family/patient expects to be discharged to:: Private residence Living Arrangements: Non-relatives/Friends Available Help at Discharge: Family Type of Home: Apartment Home Access: Elevator       Home Layout: One level Home Equipment: Agricultural consultant (2 wheels) Additional Comments: provided with 2WW by family a few days before admission    Prior Function Prior Level of Function : Needs assist             Mobility Comments: pt ambulated ind up until right before admission. Able to access her apartment via elevator. Could access dining hall on her own. ADLs Comments: Pt notes independent with all ADL tasks, light meal prep and takes 1 meal per day in dining hall; endorses 1 fall where she tripped on her bathmat.     Hand Dominance        Extremity/Trunk Assessment   Upper Extremity Assessment Upper Extremity Assessment: Generalized weakness    Lower Extremity Assessment Lower Extremity Assessment: Generalized weakness       Communication   Communication: No difficulties  Cognition Arousal/Alertness: Awake/alert Behavior During Therapy: WFL for tasks assessed/performed Overall Cognitive Status: Within Functional Limits for tasks assessed                                          General Comments      Exercises General Exercises - Lower Extremity Long Arc Quad: AROM, Both, 5 reps, Seated   Assessment/Plan    PT Assessment Patient needs continued PT services  PT Problem List Decreased strength;Decreased range of motion;Decreased activity tolerance;Decreased balance;Decreased mobility;Decreased knowledge of use of DME;Pain       PT Treatment Interventions DME instruction;Gait training;Functional mobility training;Therapeutic activities;Therapeutic exercise;Balance  training;Patient/family education    PT Goals (Current goals can be found in the Care Plan section)  Acute Rehab PT Goals Patient Stated Goal: To go home PT Goal Formulation: With patient Time For Goal Achievement: 09/29/22 Potential to Achieve Goals: Fair    Frequency Min 2X/week     Co-evaluation               AM-PAC PT "6 Clicks" Mobility  Outcome Measure Help needed turning from your back to your side while in a flat bed without using bedrails?: A Little Help needed moving from lying on your back to sitting on the side of a flat bed without using bedrails?: A Little Help needed moving to and from a bed to a chair (including a wheelchair)?: A Little Help needed standing up from a chair using your arms (e.g., wheelchair or bedside chair)?: A Little Help needed to walk in hospital room?: A Little Help needed climbing 3-5 steps with a railing? : A Lot 6 Click Score: 17    End of Session Equipment Utilized During Treatment: Gait belt Activity Tolerance: Patient tolerated treatment well Patient left: in bed;with call bell/phone within reach;with bed alarm set Nurse Communication: Mobility status PT Visit Diagnosis: Unsteadiness on feet (R26.81);Other abnormalities of gait and mobility (R26.89);Repeated falls (R29.6);Muscle weakness (generalized) (M62.81);Pain Pain - Right/Left: Left Pain - part of body:  (abdomen)    Time: 1610-9604 PT Time Calculation (min) (ACUTE ONLY): 38 min   Charges:  Malachi Carl, SPT   Malachi Carl 09/15/2022, 4:21 PM

## 2022-09-15 NOTE — Progress Notes (Signed)
Patient ID: Felicia Hughes, female   DOB: 01-Sep-1933, 89 y.o.   MRN: [id number]  And reevaluated this afternoon.  Patient was resting comfortable with good attitude.  Patient very pleasant.  No significant pain.  Drain serosanguineous.  Vitals:   09/15/22 0900 09/15/22 1000  BP: 136/65 (!) 143/66  Pulse: (!) 120 (!) 121  Resp: 17 20  Temp:    SpO2: 92% 96%   Even though current vitals charted says that heart rate is 120, heart rate at bedside right now was 104. Abdomen: Soft and depressible, nondistended, drain in place  A/P: Will continue with plan of ordering upper GI series tomorrow for evaluation of the duodenal repair.  Continue NGT to low intermittent suction today.  Continue pain management.  I will continue to follow closely.  Carolan Shiver, MD

## 2022-09-15 NOTE — Progress Notes (Signed)
[facility]  - [place] [facility]   PATIENT NAME: Felicia Hughes    MR#:  [id number]  DATE OF BIRTH:  Jul 24, 1933  SUBJECTIVE:      VITALS:  Blood pressure (!) 147/59, pulse (!) 103, temperature 99.3 F (37.4 C), resp. rate 20, height 5' (1.524 m), weight 48.8 kg, SpO2 95 %.  PHYSICAL EXAMINATION:   GENERAL:  89 y.o.-year-old patient with no acute distress.  LUNGS: Normal breath sounds bilaterally, no wheezing CARDIOVASCULAR: S1, S2 normal. No murmur   ABDOMEN: Soft, nontender, nondistended. Bowel sounds present.  EXTREMITIES: No  edema b/l.    NEUROLOGIC: nonfocal  patient is alert and awake SKIN: No obvious rash, lesion, or ulcer.   LABORATORY PANEL:  CBC Recent Labs  Lab 09/15/22 0513  WBC 14.3*  HGB 12.2  HCT 37.6  PLT 442*    Chemistries  Recent Labs  Lab 09/15/22 0513  NA 135  K 4.4  CL 108  CO2 17*  GLUCOSE 130*  BUN 29*  CREATININE 1.16*  CALCIUM 8.0*  MG 2.6*  AST 27  ALT 19  ALKPHOS 47  BILITOT 0.7   Cardiac Enzymes No results for input(s): "TROPONINI" in the last 168 hours. RADIOLOGY:  CT ABDOMEN PELVIS WO CONTRAST  Result Date: 09/14/2022 CLINICAL DATA:  Postop right inguinal hernia repair, pain EXAM: CT ABDOMEN AND PELVIS WITHOUT CONTRAST TECHNIQUE: Multidetector CT imaging of the abdomen and pelvis was performed following the standard protocol without IV contrast. RADIATION DOSE REDUCTION: This exam was performed according to the departmental dose-optimization program which includes automated exposure control, adjustment of the mA and/or kV according to patient size and/or use of iterative reconstruction technique. COMPARISON:  09/14/2022 at 8:52 a.m. FINDINGS: Lower chest: Trace bilateral pleural effusions. Stable hiatal hernia. Hepatobiliary: Cholecystectomy. Unremarkable unenhanced appearance of the liver. Pancreas: Unremarkable unenhanced appearance. Spleen: Unremarkable unenhanced appearance. Adrenals/Urinary Tract:  Residual contrast within the kidneys from earlier CT exam. No acute finding. The adrenals are unremarkable. Contrast fills the bladder lumen, with no filling defect. Stomach/Bowel: In the interim since the prior study, diffuse pneumoperitoneum has developed consistent with perforated viscus. Extraluminal gas is seen adjacent to the gastric pylorus, and this could reflect the site of perforation. Surgical consultation is recommended. Wall thickening of the small and large bowel again identified consistent with enterocolitis. Wall thickening is most pronounced within the distal small bowel and proximal colon, as well as within the gastric antrum and proximal duodenum. No evidence of bowel obstruction or ileus. Vascular/Lymphatic: Aortic atherosclerosis. No enlarged abdominal or pelvic lymph nodes. Reproductive: Status post hysterectomy. No adnexal masses. Other: Diffuse pneumoperitoneum as described above, with increasing free fluid throughout the abdomen and pelvis consistent with perforated viscus. No abdominal wall hernia. Postsurgical changes from right inguinal hernia repair. Musculoskeletal: No acute or destructive bony abnormalities. Reconstructed images demonstrate no additional findings. IMPRESSION: 1. Interval development of diffuse pneumoperitoneum and increasing free fluid throughout the abdomen and pelvis, consistent with perforated viscus. Site of perforation may be within the gastric antrum/pylorus, where prominent wall thickening and adjacent extraluminal gas are identified reference images 37-43 of series 2. Surgical consultation recommended. 2. Stable diffuse wall thickening of the distal small bowel and proximal colon consistent with enterocolitis. 3. Trace bilateral pleural effusions. 4. Hiatal hernia. 5.  Aortic Atherosclerosis (ICD10-I70.0). Critical Value/emergent results were called by telephone at the time of interpretation on 09/14/2022 at 7:38 pm to provider [facility] , who verbally  acknowledged these results. Electronically Signed   By: Casimiro Needle  Manson Passey M.D.   On: 09/14/2022 19:39   CT CHEST WO CONTRAST  Result Date: 09/14/2022 CLINICAL DATA:  Abnormal xray - lung opacity/opacities EXAM: CT CHEST WITHOUT CONTRAST TECHNIQUE: Multidetector CT imaging of the chest was performed following the standard protocol without IV contrast. RADIATION DOSE REDUCTION: This exam was performed according to the departmental dose-optimization program which includes automated exposure control, adjustment of the mA and/or kV according to patient size and/or use of iterative reconstruction technique. COMPARISON:  CT abdomen pelvis 09/14/2022 8:59 a.m., CT abdomen pelvis 09/03/2022 FINDINGS: Cardiovascular: Normal heart size. No significant pericardial effusion. The thoracic aorta is normal in caliber. Moderate atherosclerotic plaque of the thoracic aorta. At least 3 vessel coronary artery calcifications. Aortic valve leaflet calcification. Likely mitral annular calcification. Mediastinum/Nodes: No enlarged mediastinal or axillary lymph nodes. Thyroid gland, trachea are unremarkable. Diffuse esophageal wall thickening. Small hiatal hernia. Lungs/Pleura: Biapical pleural/pulmonary scarring. No focal consolidation. No pulmonary nodule. No pulmonary mass. No pleural effusion. No pneumothorax. Upper Abdomen: Interval increase in size of at least small volume intraperitoneal gas. Interval increase in size of at least small to moderate volume, likely complex, intraperitoneal free fluid. Musculoskeletal: No chest wall abnormality. No suspicious lytic or blastic osseous lesions. No acute displaced fracture. IMPRESSION: 1. Interval increase in size of at least small volume intraperitoneal gas. Interval increase in size of at least small to moderate volume, likely complex, intraperitoneal free fluid. Findings suggestive of bowel perforation. Recommend surgical consultation. If follow-up CT abdomen pelvis repeated, please  obtain with IV and PO contrast. 2. Diffuse esophageal wall thickening. Small hiatal hernia. Correlate with signs and symptoms of esophagitis. Consider direct visualization. 3. Aortic Atherosclerosis (ICD10-I70.0) including coronary artery, mitral annular, aortic valvular calcification-correlate with aortic stenosis. These results will be called to the ordering clinician or representative by the Radiologist Assistant, and communication documented in the PACS or [employer]. Electronically Signed   By: Tish Frederickson M.D.   On: 09/14/2022 19:10   CT ABDOMEN PELVIS W CONTRAST  Result Date: 09/14/2022 CLINICAL DATA:  Severe postop abdominal pain. Recently postop from surgical repair of inguinal hernia and small bowel obstruction. EXAM: CT ABDOMEN AND PELVIS WITH CONTRAST TECHNIQUE: Multidetector CT imaging of the abdomen and pelvis was performed using the standard protocol following bolus administration of intravenous contrast. RADIATION DOSE REDUCTION: This exam was performed according to the departmental dose-optimization program which includes automated exposure control, adjustment of the mA and/or kV according to patient size and/or use of iterative reconstruction technique. CONTRAST:  75mL OMNIPAQUE IOHEXOL 300 MG/ML  SOLN COMPARISON:  09/03/2022 FINDINGS: Lower Chest: No acute findings. Hepatobiliary: No hepatic masses identified. Prior cholecystectomy. Diffuse biliary ductal dilatation is again seen. Pancreas:  No mass or inflammatory changes. Spleen: Within normal limits in size and appearance. Adrenals/Urinary Tract: No suspicious masses identified. No evidence of ureteral calculi or hydronephrosis. Stomach/Bowel: Tiny amount of postop free intraperitoneal air is noted. There has been surgical repair of right inguinal hernia and resolution of small bowel obstruction since prior study. Mild diffuse small bowel wall thickening is seen as well as wall thickening and mucosal enhancement of the ascending  and transverse colon. This is consistent with enterocolitis. Small amount ascites noted, without evidence of abscess. Vascular/Lymphatic: No pathologically enlarged lymph nodes. No acute vascular findings. Reproductive: Prior hysterectomy noted. Adnexal regions are unremarkable in appearance. Other:  None. Musculoskeletal:  No suspicious bone lesions identified. IMPRESSION: Interval surgical repair of right inguinal hernia, with resolution of small bowel obstruction since prior study. Tiny amount  of residual postop free air noted. Mild diffuse small bowel wall thickening and mucosal enhancement of ascending and transverse colon, consistent with enterocolitis. Small amount of ascites. No evidence of abscess. Stable diffuse biliary ductal dilatation, which may be due to prior cholecystectomy. Recommend correlation with liver function tests, and consider abdomen MRI and MRCP without and with contrast for further evaluation if clinically warranted. Electronically Signed   By: Danae Orleans M.D.   On: 09/14/2022 09:40    Assessment and Plan  89 y.o female with significant PMH of CKD, GERD, HLD, HTN, s/p cholecystectomy who presented to the ED with chief complaints of worsening abdominal pain.  patient recently had an open right incarcerated inguinal hernia repair on 6/14 with Dr. Everlene Farrier.  Since discharge she has had very mild right-sided abdominal pain for a few days that worsened on the day of admission with associated nausea, vomiting and diarrhea.  CT abdomen with pelvis showing resolution of small bowel obstruction post inguinal hernia repair. Patient complained of worsening abdominal pain repeat CT abdomen pelvis obtained which showed perforated viscus.  General surgery consulted and patient taken urgently to the OR for repair.   6/24: CTA abdomen and pelvis IMPRESSION: 1. Interval development of diffuse pneumoperitoneum and increasing free fluid throughout the abdomen and pelvis, consistent  with perforated viscus. Site of perforation may be within the gastric antrum/pylorus, where prominent wall thickening and adjacent extraluminal gas are identified reference images 37-43 of series 2. Surgical consultation recommended. 2. Stable diffuse wall thickening of the distal small bowel and proximal colon consistent with enterocolitis. 3. Trace bilateral pleural effusions. 4. Hiatal hernia. 5.  Aortic Atherosclerosis (ICD10-I70.0).  Acute Abdomen in the setting of Perforated Viscus Recent Incarcerated Inguinal Hernia S/P Repair 6/14 --Repeat CT abdomen pelvis  6/24 shows with free air and fluid in her abdomen consistent with viscus perforation --S/p ex lap  for repai of Perforated Duodenal Ucer POD # 1 -Keep NPO  -cont IVF  -NG tube to intermittent suction -IV broad-spectrum as below -General surgery following --dr Hazle Quant   #Sepsis without septic shock secondary to perforated viscus -Supplemental oxygen as needed, to maintain SpO2 > 90% -Lactic acid 4.0--5.7-- -Monitor WBC/ fever curve -IV antibiotics: zosyn  -IVF hydration as needed -PRN Pressors for MAP goal >65   # AKI on CKD Stage III # Hyponatremia # Mild metabolic acidosis -Trend Lactate - Monitor I&O's / urinary output - Ensure adequate renal perfusion - Avoid nephrotoxic agents as able - Replace electrolytes as indicated --bicarb gtt    #HLD  #HTN -Atorvastatin 20mg  PO at bedtime once able to tolerate po -Resume BP meds as renal and BP permits --Prn hydralazine  Procedures: Family communication :husband at bedside Consults :gen surgery CODE STATUS: full DVT Prophylaxis :heparin Level of care: Stepdown Status is: Inpatient Remains inpatient appropriate because: POD #1 perforated viscous repair    TOTAL TIME TAKING CARE OF THIS PATIENT: 35 minutes.  >50% time spent on counselling and coordination of care  Note: This dictation was prepared with Dragon dictation along with smaller phrase  technology. Any transcriptional errors that result from this process are unintentional.  Enedina Finner M.D    [facility]   CC: Primary care physician; Barbette Reichmann, MD

## 2022-09-15 NOTE — Evaluation (Signed)
Occupational Therapy Evaluation Patient Details Name: Felicia Hughes MRN: [id number] DOB: 31-Jan-1934 Today's Date: 09/15/2022   History of Present Illness 89 y.o female with significant PMH of CKD, GERD, HLD, HTN, s/p cholecystectomy who presented to the ED with chief complaints of worsening abdominal pain. Pt had an open right incarcerated inguinal hernia repair on 6/14 and since discharge has had mild right-sided abdominal pain, worsening day of admission with nausea, vomiting, and diarrhea. Repeat imaging showed perforated viscus. PT admitted to hospital service with enterocolitis found with acute abdomen and pneumoperitoneum consistent with perforated viscus. Now s/p exploratory laparotomy and duodenal ulcer repair on 6/24.   Clinical Impression   Pt was seen for OT evaluation this date. RN cleared OT to work with pt. Prior to hospital admission, pt was ambulating independently to/from dining hall at ILF apartment and only within the last few days was provided with a walker from family. She was indep with ADL and driving. Pt lives in her ILF apartment with her friend, Lollie Sails, and endorses 1 fall where she tripped over the bathmat that she forgot to put up. Pt presents to acute OT demonstrating impaired ADL performance and functional mobility 2/2 abdominal/incision pain, decreased strength, activity tolerance, and balance (See OT problem list for additional functional deficits). Pt currently requires MIN A for bed mobility, MIN A to stand, and CGA-MIN A for taking a few steps with RW to recliner. VSS throughout. Pt would benefit from additional skilled OT services to address noted impairments and functional limitations (see below for any additional details) in order to maximize safety and independence while minimizing falls risk and caregiver burden.    Recommendations for follow up therapy are one component of a multi-disciplinary discharge planning process, led by the attending physician.   Recommendations may be updated based on patient status, additional functional criteria and insurance authorization.   Assistance Recommended at Discharge Frequent or constant Supervision/Assistance  Patient can return home with the following A little help with walking and/or transfers;A lot of help with bathing/dressing/bathroom;Assistance with cooking/housework;Help with stairs or ramp for entrance;Assist for transportation;Direct supervision/assist for medications management    Functional Status Assessment  Patient has had a recent decline in their functional status and demonstrates the ability to make significant improvements in function in a reasonable and predictable amount of time.  Equipment Recommendations  Other (comment) (defer to next venue of care)    Recommendations for Other Services       Precautions / Restrictions Precautions Precautions: Fall Precaution Comments: LLQ JP bulb drain, abdominal incision, NG tube Restrictions Weight Bearing Restrictions: No      Mobility Bed Mobility Overal bed mobility: Needs Assistance Bed Mobility: Supine to Sit     Supine to sit: Min assist, HOB elevated     General bed mobility comments: increased time/effort to complete    Transfers Overall transfer level: Needs assistance Equipment used: Rolling walker (2 wheels) Transfers: Sit to/from Stand, Bed to chair/wheelchair/BSC Sit to Stand: Min assist     Step pivot transfers: Min assist     General transfer comment: VC RW mgt      Balance Overall balance assessment: Needs assistance Sitting-balance support: No upper extremity supported, Feet supported, Single extremity supported Sitting balance-Leahy Scale: Fair     Standing balance support: Bilateral upper extremity supported Standing balance-Leahy Scale: Fair                             ADL either  performed or assessed with clinical judgement   ADL                                          General ADL Comments: Pt currently requires MOD A for LB ADL tasks, MIN A for ADL transfers + RW, and MIN A for seated UB ADL.     Vision         Perception     Praxis      Pertinent Vitals/Pain Pain Assessment Pain Assessment: 0-10 Pain Score: 3  Pain Location: abdomen Pain Descriptors / Indicators: Aching Pain Intervention(s): Limited activity within patient's tolerance, Monitored during session, Premedicated before session, Repositioned     Hand Dominance     Extremity/Trunk Assessment Upper Extremity Assessment Upper Extremity Assessment: Generalized weakness;Overall [facility] for tasks assessed   Lower Extremity Assessment Lower Extremity Assessment: Overall WFL for tasks assessed;Generalized weakness       Communication     Cognition Arousal/Alertness: Awake/alert Behavior During Therapy: WFL for tasks assessed/performed Overall Cognitive Status: Within Functional Limits for tasks assessed                                       General Comments       Exercises     Shoulder Instructions      Home Living Family/patient expects to be discharged to:: Private residence (independent living at [facility] [place]) Living Arrangements: Non-relatives/Friends (friend Hydrologist) Available Help at Discharge: Family Type of Home: Apartment Home Access: Elevator     Home Layout: One level     Bathroom Shower/Tub: Walk-in Soil scientist Toilet: Handicapped height     Home Equipment: Agricultural consultant (2 wheels)   Additional Comments: provided with 2WW by family a few days before admission      Prior Functioning/Environment Prior Level of Function : Needs assist             Mobility Comments: pt ambulating independently up until the last few days leading up to admission, had been able to get from her apartment to the elevator and down to the dining hall on her own. ADLs Comments: Pt notes independent with all ADL tasks, light meal  prep and takes 1 meal per day in dining hall; endorses 1 fall where she tripped on her bathmat.        OT Problem List: Decreased strength;Pain;Decreased activity tolerance;Decreased knowledge of use of DME or AE;Impaired balance (sitting and/or standing)      OT Treatment/Interventions: Self-care/ADL training;Therapeutic exercise;Therapeutic activities;DME and/or AE instruction;Patient/family education;Balance training    OT Goals(Current goals can be found in the care plan section) Acute Rehab OT Goals Patient Stated Goal: get better and go back to [facility] [place] with Lollie Sails OT Goal Formulation: With patient Time For Goal Achievement: 09/29/22 Potential to Achieve Goals: Good ADL Goals Pt Will Perform Lower Body Dressing: sit to/from stand;with min assist Pt Will Transfer to Toilet: ambulating;with supervision (LRAD) Pt Will Perform Toileting - Clothing Manipulation and hygiene: with modified independence Additional ADL Goal #1: Pt will verbalize plan to implement at least 1 learned ECS into daily ADL/IADL routine to maximize safety/indep.  OT Frequency: Min 2X/week    Co-evaluation              AM-PAC OT "6 Clicks" Daily  Activity     Outcome Measure Help from another person eating meals?: None Help from another person taking care of personal grooming?: A Little Help from another person toileting, which includes using toliet, bedpan, or urinal?: A Lot Help from another person bathing (including washing, rinsing, drying)?: A Lot Help from another person to put on and taking off regular upper body clothing?: A Little Help from another person to put on and taking off regular lower body clothing?: A Lot 6 Click Score: 16   End of Session Equipment Utilized During Treatment: Rolling walker (2 wheels) Nurse Communication: Mobility status (cleared OT to work with pt)  Activity Tolerance: Patient tolerated treatment well Patient left: in chair;with call bell/phone within  reach  OT Visit Diagnosis: Other abnormalities of gait and mobility (R26.89);Pain Pain - Right/Left:  (abdomen)                Time: 1610-9604 OT Time Calculation (min): 40 min Charges:  OT General Charges $OT Visit: 1 Visit OT Evaluation $OT Eval Moderate Complexity: 1 Mod OT Treatments $Self Care/Home Management : 8-22 mins  Arman Filter., MPH, MS, OTR/L ascom [phone] 09/15/22, 1:31 PM

## 2022-09-16 ENCOUNTER — Inpatient Hospital Stay: Payer: Medicare Other

## 2022-09-16 DIAGNOSIS — K5100 Ulcerative (chronic) pancolitis without complications: Secondary | ICD-10-CM | POA: Diagnosis not present

## 2022-09-16 LAB — COMPREHENSIVE METABOLIC PANEL
ALT: 21 U/L (ref 0–44)
AST: 34 U/L (ref 15–41)
Albumin: 2.7 g/dL — ABNORMAL LOW (ref 3.5–5.0)
Alkaline Phosphatase: 57 U/L (ref 38–126)
Anion gap: 7 (ref 5–15)
BUN: 35 mg/dL — ABNORMAL HIGH (ref 8–23)
CO2: 25 mmol/L (ref 22–32)
Calcium: 8.2 mg/dL — ABNORMAL LOW (ref 8.9–10.3)
Chloride: 104 mmol/L (ref 98–111)
Creatinine, Ser: 1 mg/dL (ref 0.44–1.00)
GFR, Estimated: 55 mL/min — ABNORMAL LOW (ref >60)
Glucose, Bld: 113 mg/dL — ABNORMAL HIGH (ref 70–99)
Potassium: 3.6 mmol/L (ref 3.5–5.1)
Sodium: 136 mmol/L (ref 135–145)
Total Bilirubin: 0.6 mg/dL (ref 0.3–1.2)
Total Protein: 5.2 g/dL — ABNORMAL LOW (ref 6.5–8.1)

## 2022-09-16 LAB — LACTIC ACID, PLASMA: Lactic Acid, Venous: 1.1 mmol/L (ref 0.5–1.9)

## 2022-09-16 MED ORDER — MORPHINE SULFATE (PF) 2 MG/ML IV SOLN
2.0000 mg | INTRAVENOUS | Status: DC | PRN
  Administered 2022-09-16 – 2022-09-18 (×4): 2 mg via INTRAVENOUS
  Filled 2022-09-16 (×5): qty 1

## 2022-09-16 MED ORDER — LOSARTAN POTASSIUM 25 MG PO TABS
25.0000 mg | ORAL_TABLET | Freq: Every day | ORAL | Status: DC
  Administered 2022-09-16 – 2022-09-20 (×5): 25 mg via ORAL
  Filled 2022-09-16 (×5): qty 1

## 2022-09-16 MED ORDER — ATORVASTATIN CALCIUM 20 MG PO TABS
20.0000 mg | ORAL_TABLET | Freq: Every day | ORAL | Status: DC
  Administered 2022-09-16 – 2022-09-20 (×5): 20 mg via ORAL
  Filled 2022-09-16 (×5): qty 1

## 2022-09-16 MED ORDER — SODIUM CHLORIDE 0.9 % IV SOLN: INTRAVENOUS | Status: DC

## 2022-09-16 MED ORDER — MELATONIN 5 MG PO TABS
2.5000 mg | ORAL_TABLET | Freq: Every evening | ORAL | Status: DC | PRN
  Administered 2022-09-16 – 2022-09-20 (×5): 2.5 mg via ORAL
  Filled 2022-09-16 (×6): qty 1

## 2022-09-16 MED ORDER — PANTOPRAZOLE SODIUM 40 MG PO TBEC
40.0000 mg | DELAYED_RELEASE_TABLET | Freq: Two times a day (BID) | ORAL | Status: DC
  Administered 2022-09-17 – 2022-09-21 (×9): 40 mg via ORAL
  Filled 2022-09-16 (×9): qty 1

## 2022-09-16 MED ORDER — ENOXAPARIN SODIUM 30 MG/0.3ML IJ SOSY
30.0000 mg | PREFILLED_SYRINGE | INTRAMUSCULAR | Status: DC
  Administered 2022-09-16 – 2022-09-20 (×5): 30 mg via SUBCUTANEOUS
  Filled 2022-09-16 (×5): qty 0.3

## 2022-09-16 MED ORDER — VITAMIN D 25 MCG (1000 UNIT) PO TABS
1000.0000 [IU] | ORAL_TABLET | Freq: Every day | ORAL | Status: DC
  Administered 2022-09-16 – 2022-09-21 (×6): 1000 [IU] via ORAL
  Filled 2022-09-16 (×6): qty 1

## 2022-09-16 MED ORDER — MIRABEGRON ER 25 MG PO TB24
25.0000 mg | ORAL_TABLET | Freq: Every day | ORAL | Status: DC
  Administered 2022-09-16 – 2022-09-21 (×6): 25 mg via ORAL
  Filled 2022-09-16 (×6): qty 1

## 2022-09-16 NOTE — Progress Notes (Signed)
PT Cancellation Note  Patient Details Name: Felicia Hughes MRN: [id number] DOB: 10/20/1933   Cancelled Treatment:    Reason Eval/Treat Not Completed: Other (comment). Treatment attempted, however pt currently out of room for imaging. Will re-attempt next date.   Dorance Spink 09/16/2022, 2:18 PM Elizabeth Palau, PT, DPT, GCS [phone]

## 2022-09-16 NOTE — Progress Notes (Signed)
[facility]  - [employer] [facility]   PATIENT NAME: Felicia Hughes    MR#:  [id number]  DATE OF BIRTH:  1933-08-25  SUBJECTIVE:  patient sitting up in the chair. Family at bedside. Patient wondering when NG tube will come out. Discussed with her about getting upper G.I. series done today and await results for general surgery to decide. Reports passing gas. No pain or fever    VITALS:  Blood pressure (!) 144/84, pulse 97, temperature 98.8 F (37.1 C), temperature source Oral, resp. rate 15, height 5' (1.524 m), weight 49.6 kg, SpO2 94 %.  PHYSICAL EXAMINATION:   GENERAL:  89 y.o.-year-old patient with no acute distress.  LUNGS: Normal breath sounds bilaterally, no wheezing NG+ CARDIOVASCULAR: S1, S2 normal. No murmur   ABDOMEN: Soft, nontender, nondistended. Bowel sounds present.  EXTREMITIES: No  edema b/l.    NEUROLOGIC: nonfocal  patient is alert and awake  LABORATORY PANEL:  CBC Recent Labs  Lab 09/15/22 0513  WBC 14.3*  HGB 12.2  HCT 37.6  PLT 442*     Chemistries  Recent Labs  Lab 09/15/22 0513 09/16/22 0424  NA 135 136  K 4.4 3.6  CL 108 104  CO2 17* 25  GLUCOSE 130* 113*  BUN 29* 35*  CREATININE 1.16* 1.00  CALCIUM 8.0* 8.2*  MG 2.6*  --   AST 27 34  ALT 19 21  ALKPHOS 47 57  BILITOT 0.7 0.6    Cardiac Enzymes No results for input(s): "TROPONINI" in the last 168 hours. RADIOLOGY:  CT ABDOMEN PELVIS WO CONTRAST  Result Date: 09/14/2022 CLINICAL DATA:  Postop right inguinal hernia repair, pain EXAM: CT ABDOMEN AND PELVIS WITHOUT CONTRAST TECHNIQUE: Multidetector CT imaging of the abdomen and pelvis was performed following the standard protocol without IV contrast. RADIATION DOSE REDUCTION: This exam was performed according to the departmental dose-optimization program which includes automated exposure control, adjustment of the mA and/or kV according to patient size and/or use of iterative reconstruction technique. COMPARISON:   09/14/2022 at 8:52 a.m. FINDINGS: Lower chest: Trace bilateral pleural effusions. Stable hiatal hernia. Hepatobiliary: Cholecystectomy. Unremarkable unenhanced appearance of the liver. Pancreas: Unremarkable unenhanced appearance. Spleen: Unremarkable unenhanced appearance. Adrenals/Urinary Tract: Residual contrast within the kidneys from earlier CT exam. No acute finding. The adrenals are unremarkable. Contrast fills the bladder lumen, with no filling defect. Stomach/Bowel: In the interim since the prior study, diffuse pneumoperitoneum has developed consistent with perforated viscus. Extraluminal gas is seen adjacent to the gastric pylorus, and this could reflect the site of perforation. Surgical consultation is recommended. Wall thickening of the small and large bowel again identified consistent with enterocolitis. Wall thickening is most pronounced within the distal small bowel and proximal colon, as well as within the gastric antrum and proximal duodenum. No evidence of bowel obstruction or ileus. Vascular/Lymphatic: Aortic atherosclerosis. No enlarged abdominal or pelvic lymph nodes. Reproductive: Status post hysterectomy. No adnexal masses. Other: Diffuse pneumoperitoneum as described above, with increasing free fluid throughout the abdomen and pelvis consistent with perforated viscus. No abdominal wall hernia. Postsurgical changes from right inguinal hernia repair. Musculoskeletal: No acute or destructive bony abnormalities. Reconstructed images demonstrate no additional findings. IMPRESSION: 1. Interval development of diffuse pneumoperitoneum and increasing free fluid throughout the abdomen and pelvis, consistent with perforated viscus. Site of perforation may be within the gastric antrum/pylorus, where prominent wall thickening and adjacent extraluminal gas are identified reference images 37-43 of series 2. Surgical consultation recommended. 2. Stable diffuse wall thickening of the distal  small bowel and  proximal colon consistent with enterocolitis. 3. Trace bilateral pleural effusions. 4. Hiatal hernia. 5.  Aortic Atherosclerosis (ICD10-I70.0). Critical Value/emergent results were called by telephone at the time of interpretation on 09/14/2022 at 7:38 pm to provider [facility] , who verbally acknowledged these results. Electronically Signed   By: Sharlet Salina M.D.   On: 09/14/2022 19:39   CT CHEST WO CONTRAST  Result Date: 09/14/2022 CLINICAL DATA:  Abnormal xray - lung opacity/opacities EXAM: CT CHEST WITHOUT CONTRAST TECHNIQUE: Multidetector CT imaging of the chest was performed following the standard protocol without IV contrast. RADIATION DOSE REDUCTION: This exam was performed according to the departmental dose-optimization program which includes automated exposure control, adjustment of the mA and/or kV according to patient size and/or use of iterative reconstruction technique. COMPARISON:  CT abdomen pelvis 09/14/2022 8:59 a.m., CT abdomen pelvis 09/03/2022 FINDINGS: Cardiovascular: Normal heart size. No significant pericardial effusion. The thoracic aorta is normal in caliber. Moderate atherosclerotic plaque of the thoracic aorta. At least 3 vessel coronary artery calcifications. Aortic valve leaflet calcification. Likely mitral annular calcification. Mediastinum/Nodes: No enlarged mediastinal or axillary lymph nodes. Thyroid gland, trachea are unremarkable. Diffuse esophageal wall thickening. Small hiatal hernia. Lungs/Pleura: Biapical pleural/pulmonary scarring. No focal consolidation. No pulmonary nodule. No pulmonary mass. No pleural effusion. No pneumothorax. Upper Abdomen: Interval increase in size of at least small volume intraperitoneal gas. Interval increase in size of at least small to moderate volume, likely complex, intraperitoneal free fluid. Musculoskeletal: No chest wall abnormality. No suspicious lytic or blastic osseous lesions. No acute displaced fracture. IMPRESSION: 1.  Interval increase in size of at least small volume intraperitoneal gas. Interval increase in size of at least small to moderate volume, likely complex, intraperitoneal free fluid. Findings suggestive of bowel perforation. Recommend surgical consultation. If follow-up CT abdomen pelvis repeated, please obtain with IV and PO contrast. 2. Diffuse esophageal wall thickening. Small hiatal hernia. Correlate with signs and symptoms of esophagitis. Consider direct visualization. 3. Aortic Atherosclerosis (ICD10-I70.0) including coronary artery, mitral annular, aortic valvular calcification-correlate with aortic stenosis. These results will be called to the ordering clinician or representative by the Radiologist Assistant, and communication documented in the PACS or [employer]. Electronically Signed   By: Tish Frederickson M.D.   On: 09/14/2022 19:10    Assessment and Plan  89 y.o female with significant PMH of CKD, GERD, HLD, HTN, s/p cholecystectomy who presented to the ED with chief complaints of worsening abdominal pain.  patient recently had an open right incarcerated inguinal hernia repair on 6/14 with Dr. Everlene Farrier.  Since discharge she has had very mild right-sided abdominal pain for a few days that worsened on the day of admission with associated nausea, vomiting and diarrhea.  CT abdomen with pelvis showing resolution of small bowel obstruction post inguinal hernia repair. Patient complained of worsening abdominal pain repeat CT abdomen pelvis obtained which showed perforated viscus.  General surgery consulted and patient taken urgently to the OR for repair.   6/24: CTA abdomen and pelvis IMPRESSION: 1. Interval development of diffuse pneumoperitoneum and increasing free fluid throughout the abdomen and pelvis, consistent with perforated viscus. Site of perforation may be within the gastric antrum/pylorus, where prominent wall thickening and adjacent extraluminal gas are identified reference images  37-43 of series 2. Surgical consultation recommended. 2. Stable diffuse wall thickening of the distal small bowel and proximal colon consistent with enterocolitis. 3. Trace bilateral pleural effusions. 4. Hiatal hernia. 5.  Aortic Atherosclerosis (ICD10-I70.0).  Acute Abdomen in the  setting of Perforated Viscus Recent Incarcerated Inguinal Hernia S/P Repair 6/14 --Repeat CT abdomen pelvis  6/24 shows with free air and fluid in her abdomen consistent with viscus perforation --S/p ex lap  for repai of Perforated Duodenal Ucer POD # 1 -Keep NPO  -cont IVF  -NG tube to intermittent suction -IV broad-spectrum as below -General surgery following --dr Hazle Quant --6/26-- upper G.I. series done today to evaluate for any leak. Await results in further surgery recommendation   #Sepsis without septic shock secondary to perforated viscus -Supplemental oxygen as needed, to maintain SpO2 > 90% -Lactic acid 4.0--5.7--1.1 -Monitor WBC/ fever curve -IV antibiotics: zosyn  -IVF hydration as needed -PRN Pressors for MAP goal >65   # AKI on CKD Stage III # Hyponatremia # Mild metabolic acidosis--resolved - Ensure adequate renal perfusion - Avoid nephrotoxic agents as able - Replace electrolytes as indicated --bicarb gtt--now d/c    #HLD  #HTN -Atorvastatin 20mg  PO at bedtime once able to tolerate po -Resume BP meds as renal and BP permits --Prn hydralazine  Procedures: Family communication : daughter and granddaughter at bedside  Consults :gen surgery CODE STATUS: full DVT Prophylaxis :heparin Level of care: Stepdown Status is: Inpatient Remains inpatient appropriate because: POD #2 perforated viscous repair    TOTAL TIME TAKING CARE OF THIS PATIENT: 35 minutes.  >50% time spent on counselling and coordination of care  Note: This dictation was prepared with Dragon dictation along with smaller phrase technology. Any transcriptional errors that result from this process are  unintentional.  Enedina Finner M.D    [facility]   CC: Primary care physician; Barbette Reichmann, MD

## 2022-09-16 NOTE — TOC Progression Note (Signed)
Transition of Care [facility]) - Progression Note    Patient Details  Name: Felicia Hughes MRN: [id number] Date of Birth: 02-16-1934  Transition of Care [name]) CM/SW Contact  [name], [place] Phone Number: 09/16/2022, 2:43 PM  Clinical Narrative:     CSW confirmed with [name] [phone]) that they are holding a bed for patient at [facility], pending medical closeness to dc in order to start insurance auth.  Expected Discharge Plan: Skilled Nursing Facility Barriers to Discharge: Continued Medical Work up  Expected Discharge Plan and Services       Living arrangements for the past 2 months: Assisted Living Facility ([facility])                                       Social Determinants of Health (SDOH) Interventions SDOH Screenings   Food Insecurity: No Food Insecurity (09/14/2022)  Housing: Low Risk  (09/14/2022)  Transportation Needs: No Transportation Needs (09/14/2022)  Utilities: Not At Risk (09/14/2022)  Tobacco Use: Low Risk  (09/15/2022)    Readmission Risk Interventions    09/05/2022   11:39 AM  Readmission Risk Prevention Plan  Post Dischage Appt Complete  Medication Screening Complete  Transportation Screening Complete

## 2022-09-16 NOTE — Progress Notes (Signed)
Patient ID: Felicia Hughes, female   DOB: [birth year], 89 y.o.   MRN: [id number]     SURGICAL PROGRESS NOTE   Hospital Day(s): 2.   Interval History: Patient seen and examined, no acute events or new complaints overnight. Patient reports feeling well this morning.  She endorses that she has been having some mild to moderate pain in the right inguinal area but no pain in the midline.  She denies any nausea or vomiting.  Vital signs in last 24 hours: [min-max] current  Temp:  [98.2 F (36.8 C)-99.8 F (37.7 C)] 98.7 F (37.1 C) (06/26 0400) Pulse Rate:  [88-121] 88 (06/26 0700) Resp:  [13-29] 14 (06/26 0700) BP: (122-159)/(52-113) 131/63 (06/26 0700) SpO2:  [82 %-99 %] 98 % (06/26 0700) Weight:  [49.6 kg] 49.6 kg (06/26 0414)     Height: 5' (152.4 cm) Weight: 49.6 kg BMI (Calculated): 21.36   Physical Exam:  Constitutional: alert, cooperative and no distress  Respiratory: breathing non-labored at rest  Cardiovascular: regular rate and sinus rhythm  Gastrointestinal: soft, non-tender, and non-distended  Labs:     Latest Ref Rng & Units 09/15/2022    5:13 AM 09/14/2022    7:54 AM 09/05/2022    5:30 AM  CBC  WBC 4.0 - 10.5 K/uL 14.3  27.4  16.5   Hemoglobin 12.0 - 15.0 g/dL 40.9  81.1  91.4   Hematocrit 36.0 - 46.0 % 37.6  41.8  32.8   Platelets 150 - 400 K/uL 442  530  320       Latest Ref Rng & Units 09/16/2022    4:24 AM 09/15/2022    5:13 AM 09/14/2022    8:56 PM  CMP  Glucose 70 - 99 mg/dL 782  956  213   BUN 8 - 23 mg/dL 35  29  29   Creatinine 0.44 - 1.00 mg/dL 0.86  5.78  4.69   Sodium 135 - 145 mmol/L 136  135  134   Potassium 3.5 - 5.1 mmol/L 3.6  4.4  4.2   Chloride 98 - 111 mmol/L 104  108  104   CO2 22 - 32 mmol/L 25  17  18    Calcium 8.9 - 10.3 mg/dL 8.2  8.0  8.8   Total Protein 6.5 - 8.1 g/dL 5.2  4.6    Total Bilirubin 0.3 - 1.2 mg/dL 0.6  0.7    Alkaline Phos 38 - 126 U/L 57  47    AST 15 - 41 U/L 34  27    ALT 0 - 44 U/L 21  19      Imaging studies: No new  pertinent imaging studies   Assessment/Plan:  89 y.o. female with peptic ulcer disease with perforation 2 Day Post-Op s/p duodenal ulcer repair.   -Patient with adequate vital signs, improved heart rate, no tachycardia.  No fever. -She continue adequate postop recovery -Will order upper GI study to rule out leak from ulcer repair.  If negative will consider removing NGT and starting her liquid diet. -Continue PT/OT therapies -Continue pain management -Continue IV antibiotic therapy due to severe bilious peritonitis found IntraOp   Gae Gallop, MD

## 2022-09-16 NOTE — Progress Notes (Signed)
   09/16/22 1600  Spiritual Encounters  Type of Visit Declined chaplain visit   The family already had a advance directive in place

## 2022-09-17 DIAGNOSIS — K5100 Ulcerative (chronic) pancolitis without complications: Secondary | ICD-10-CM | POA: Diagnosis not present

## 2022-09-17 MED ORDER — ADULT MULTIVITAMIN W/MINERALS CH
1.0000 | ORAL_TABLET | Freq: Every day | ORAL | Status: DC
  Administered 2022-09-17 – 2022-09-21 (×4): 1 via ORAL
  Filled 2022-09-17 (×4): qty 1

## 2022-09-17 NOTE — Progress Notes (Signed)
[facility]  - [place] at [facility]   PATIENT NAME: Felicia Hughes    MR#:  [id number]  DATE OF BIRTH:  01/02/1934  SUBJECTIVE:  patient sitting up in the chair. Dter at bedside. Patient tolerating full liquid diet. NG removed yesterday. She had a bowel movement. Abdominal drain removed by surgery earlier. Overall moving in the right direction.   VITALS:  Blood pressure 127/60, pulse 87, temperature 98.3 F (36.8 C), temperature source Oral, resp. rate 20, height 5' (1.524 m), weight 53 kg, SpO2 94 %.  PHYSICAL EXAMINATION:   GENERAL:  89 y.o.-year-old patient with no acute distress.  LUNGS: Normal breath sounds bilaterally, no wheezing CARDIOVASCULAR: S1, S2 normal. No murmur   ABDOMEN: Soft, nontender, nondistended.surgical incision ok EXTREMITIES: No  edema b/l.    NEUROLOGIC: nonfocal  patient is alert and awake  LABORATORY PANEL:  CBC Recent Labs  Lab 09/15/22 0513  WBC 14.3*  HGB 12.2  HCT 37.6  PLT 442*     Chemistries  Recent Labs  Lab 09/15/22 0513 09/16/22 0424  NA 135 136  K 4.4 3.6  CL 108 104  CO2 17* 25  GLUCOSE 130* 113*  BUN 29* 35*  CREATININE 1.16* 1.00  CALCIUM 8.0* 8.2*  MG 2.6*  --   AST 27 34  ALT 19 21  ALKPHOS 47 57  BILITOT 0.7 0.6    Cardiac Enzymes No results for input(s): "TROPONINI" in the last 168 hours. RADIOLOGY:  DG UGI W SINGLE CM (SOL OR THIN BA)  Result Date: 09/16/2022 CLINICAL DATA:  89 year old female 2 days status post ex lap for perforated duodenal ulcer repair. Patient referred for water-soluble upper GI study to evaluate for leak. EXAM: DG UGI W WATER-SOLUBLE CONTRAST TECHNIQUE: Scout radiograph was obtained. Single contrast examination was performed using 100 mL water-soluble contrast. This exam was performed by Alex Gardener, NP, and was supervised and interpreted by Elige Ko, MD. FLUOROSCOPY: Radiation Exposure Index (as provided by the fluoroscopic device): 43.10 mGy Kerma COMPARISON:  None  Available. FINDINGS: Scout Radiograph: Nasogastric tube with the tip projecting over the stomach. No bowel dilatation to suggest obstruction. No evidence of pneumoperitoneum, portal venous gas or pneumatosis. No pathologic calcifications along the expected course of the ureters. No acute osseous abnormality. Surgical clips in the midline. 100 mL Omnipaque 300 was hand injected through a nasogastric tube. Stomach: Normal appearance. No hiatal hernia. Gastric emptying: Normal. Duodenum: Normal appearance. No extraluminal contrast to suggest a leak. Other:  None. IMPRESSION: No extraluminal contrast to suggest a duodenal leak. Electronically Signed   By: Elige Ko M.D.   On: 09/16/2022 15:26    Assessment and Plan  89 y.o female with significant PMH of CKD, GERD, HLD, HTN, s/p cholecystectomy who presented to the ED with chief complaints of worsening abdominal pain.  patient recently had an open right incarcerated inguinal hernia repair on 6/14 with Dr. Everlene Farrier.  Since discharge she has had very mild right-sided abdominal pain for a few days that worsened on the day of admission with associated nausea, vomiting and diarrhea.  CT abdomen with pelvis showing resolution of small bowel obstruction post inguinal hernia repair. Patient complained of worsening abdominal pain repeat CT abdomen pelvis obtained which showed perforated viscus.  General surgery consulted and patient taken urgently to the OR for repair.   6/24: CTA abdomen and pelvis IMPRESSION: 1. Interval development of diffuse pneumoperitoneum and increasing free fluid throughout the abdomen and pelvis, consistent with perforated viscus. Site of  perforation may be within the gastric antrum/pylorus, where prominent wall thickening and adjacent extraluminal gas are identified reference images 37-43 of series 2. Surgical consultation recommended. 2. Stable diffuse wall thickening of the distal small bowel and proximal colon consistent with  enterocolitis. 3. Trace bilateral pleural effusions. 4. Hiatal hernia. 5.  Aortic Atherosclerosis (ICD10-I70.0).  Acute Abdomen in the setting of Perforated Viscus Recent Incarcerated Inguinal Hernia S/P Repair 6/14 --Repeat CT abdomen pelvis  6/24 shows with free air and fluid in her abdomen consistent with viscus perforation --S/p ex lap  for repai of Perforated Duodenal Ucer POD # 1 -Keep NPO  -cont IVF  -NG tube to intermittent suction -IV broad-spectrum as below -General surgery following --dr Hazle Quant --6/26-- upper G.I. series done today to evaluate for any leak. Await results in further surgery recommendation --6/27-- upper G.I. series negative for leak. Patient tolerating full liquid diet. Advanced soft. Had a bowel movement. Abdominal surgical drain removed per general surgery.   #Sepsis without septic shock secondary to perforated viscus -Supplemental oxygen as needed, to maintain SpO2 > 90% -Lactic acid 4.0--5.7--1.1 -Monitor WBC/ fever curve -IV antibiotics: zosyn  -IVF hydration as needed -PRN Pressors for MAP goal >65 -- sepsis resolved  # AKI on CKD Stage III # Hyponatremia # Mild metabolic acidosis--resolved - Ensure adequate renal perfusion - Avoid nephrotoxic agents as able - Replace electrolytes as indicated --bicarb gtt--now d/c    #HLD  #HTN -Atorvastatin 20mg  PO at bedtime once able to tolerate po -Resume BP meds as renal and BP permits --Prn hydralazine -- resume losartan  If continues to show improvement patient could discharge to rehab tomorrow. TOC aware.  Procedures: Family communication : daughter at bedside  Consults :gen surgery CODE STATUS: full DVT Prophylaxis :heparin Level of care: Med-Surg Status is: Inpatient Remains inpatient appropriate because: POD #3 perforated viscous repair. Improving. Discharge 1 to 2 days    TOTAL TIME TAKING CARE OF THIS PATIENT: 35 minutes.  >50% time spent on counselling and coordination of  care  Note: This dictation was prepared with Dragon dictation along with smaller phrase technology. Any transcriptional errors that result from this process are unintentional.  Enedina Finner M.D    [facility]   CC: Primary care physician; Barbette Reichmann, MD

## 2022-09-17 NOTE — Care Management Important Message (Signed)
Important Message  Patient Details  Name: Felicia Hughes MRN: [id number] Date of Birth: [birth year]   Medicare Important Message Given:  Yes     Johnell Comings 09/17/2022, 2:56 PM

## 2022-09-17 NOTE — Progress Notes (Signed)
Patient ID: Felicia Hughes, female   DOB: [birth year], 89 y.o.   MRN: [id number]     SURGICAL PROGRESS NOTE   Hospital Day(s): 3.   Interval History: Patient seen and examined, no acute events or new complaints overnight. Patient reports having some pain in the right side of the abdomen.  The pain is aggravated when they suction from the drain is activated.  Denies any nausea or vomiting.  Vital signs in last 24 hours: [min-max] current  Temp:  [98.8 F (37.1 C)-99.9 F (37.7 C)] 98.8 F (37.1 C) (06/27 0423) Pulse Rate:  [87-98] 92 (06/27 0423) Resp:  [14-26] 18 (06/27 0423) BP: (127-169)/(61-84) 127/67 (06/27 0423) SpO2:  [85 %-99 %] 91 % (06/27 0423) Weight:  [53 kg] 53 kg (06/27 0423)     Height: 5' (152.4 cm) Weight: 53 kg BMI (Calculated): 22.82   Physical Exam:  Constitutional: alert, cooperative and no distress  Respiratory: breathing non-labored at rest  Cardiovascular: regular rate and sinus rhythm  Gastrointestinal: soft, non-tender, and non-distended  Labs:     Latest Ref Rng & Units 09/15/2022    5:13 AM 09/14/2022    7:54 AM 09/05/2022    5:30 AM  CBC  WBC 4.0 - 10.5 K/uL 14.3  27.4  16.5   Hemoglobin 12.0 - 15.0 g/dL 40.9  81.1  91.4   Hematocrit 36.0 - 46.0 % 37.6  41.8  32.8   Platelets 150 - 400 K/uL 442  530  320       Latest Ref Rng & Units 09/16/2022    4:24 AM 09/15/2022    5:13 AM 09/14/2022    8:56 PM  CMP  Glucose 70 - 99 mg/dL 782  956  213   BUN 8 - 23 mg/dL 35  29  29   Creatinine 0.44 - 1.00 mg/dL 0.86  5.78  4.69   Sodium 135 - 145 mmol/L 136  135  134   Potassium 3.5 - 5.1 mmol/L 3.6  4.4  4.2   Chloride 98 - 111 mmol/L 104  108  104   CO2 22 - 32 mmol/L 25  17  18    Calcium 8.9 - 10.3 mg/dL 8.2  8.0  8.8   Total Protein 6.5 - 8.1 g/dL 5.2  4.6    Total Bilirubin 0.3 - 1.2 mg/dL 0.6  0.7    Alkaline Phos 38 - 126 U/L 57  47    AST 15 - 41 U/L 34  27    ALT 0 - 44 U/L 21  19      Imaging studies: No new pertinent imaging  studies   Assessment/Plan:  89 y.o. female with peptic ulcer disease with perforation 3 Day Post-Op s/p duodenal ulcer repair.   -Patient continue adequate progress -Some pain on the right side of the abdomen most likely due to the drain suction.  I remove the drain today. -Upper GI series negative for leak.  Serous output from the drain. -Will advance diet to full liquids and he can be advanced as tolerated -Continue pain management -Encouraged the patient to ambulate  Gae Gallop, MD

## 2022-09-17 NOTE — TOC Progression Note (Signed)
Transition of Care [facility]) - Progression Note    Patient Details  Name: [place] Leiphart MRN: [id number] Date of Birth: 04/08/1933  Transition of Care [facility]) CM/SW Contact  Kreg Shropshire, RN Phone Number: 09/17/2022, 3:07 PM  Clinical Narrative:    71- Cm spoke with pt regarding PT/OT at [facility] [place] and Pt agreed. Cm got permission to start auth for SNF. Auth pending.   Expected Discharge Plan: Skilled Nursing Facility Barriers to Discharge: Continued Medical Work up  Expected Discharge Plan and Services       Living arrangements for the past 2 months: Assisted Living Facility ([facility])                                       Social Determinants of Health (SDOH) Interventions SDOH Screenings   Food Insecurity: No Food Insecurity (09/14/2022)  Housing: Low Risk  (09/14/2022)  Transportation Needs: No Transportation Needs (09/14/2022)  Utilities: Not At Risk (09/14/2022)  Tobacco Use: Low Risk  (09/15/2022)    Readmission Risk Interventions    09/05/2022   11:39 AM  Readmission Risk Prevention Plan  Post Dischage Appt Complete  Medication Screening Complete  Transportation Screening Complete

## 2022-09-17 NOTE — Progress Notes (Signed)
Physical Therapy Treatment Patient Details Name: Felicia Hughes MRN: [id number] DOB: 25-Feb-1934 Today's Date: 09/17/2022   History of Present Illness 89 y.o female with significant PMH of CKD, GERD, HLD, HTN, s/p cholecystectomy who presented to the ED with chief complaints of worsening abdominal pain. Pt had an open right incarcerated inguinal hernia repair on 6/14 and since discharge has had mild right-sided abdominal pain, worsening day of admission with nausea, vomiting, and diarrhea. Repeat imaging showed perforated viscus. PT admitted to hospital service with enterocolitis found with acute abdomen and pneumoperitoneum consistent with perforated viscus. Now s/p exploratory laparotomy and duodenal ulcer repair on 6/24.    PT Comments    Patient arrived in bed with family bedside; reporting just got off bedside commode and had successful bowel movement. Patient reports abdominal pain but patient agreeable to working with therapy. Patient able to complete bed mobility with Min Guard to Min A. Min A with transfers and use of RW; was able to laterally side step to [facility] for improved positioning. Refused additional ambulation distance in room at this time due to abdomen pain. Left in bed with needs met, bed alarm set, and family remains at bedside.      Recommendations for follow up therapy are one component of a multi-disciplinary discharge planning process, led by the attending physician.  Recommendations may be updated based on patient status, additional functional criteria and insurance authorization.  Follow Up Recommendations       Assistance Recommended at Discharge Intermittent Supervision/Assistance  Patient can return home with the following A little help with walking and/or transfers;A little help with bathing/dressing/bathroom;Assist for transportation;Help with stairs or ramp for entrance   Equipment Recommendations  Rolling walker (2 wheels)    Recommendations for Other Services        Precautions / Restrictions Precautions Precautions: Fall Restrictions Weight Bearing Restrictions: No     Mobility  Bed Mobility Overal bed mobility: Needs Assistance Bed Mobility: Supine to Sit, Sit to Supine     Supine to sit: Min guard (HOB slightly elevated; increased time required, Min Guard for safety) Sit to supine: Min assist (HOB flat; require assistance with LE's)   General bed mobility comments: increased time/effort to complete. verbal cues required intermittent    Transfers Overall transfer level: Needs assistance Equipment used: Rolling walker (2 wheels) Transfers: Sit to/from Stand Sit to Stand: Min guard           General transfer comment: Able to stand from EOB with use of RW, with CGA. Verbal cues for hand placement and sequencing. Mild abdominal pain with standing.    Ambulation/Gait Ambulation/Gait assistance: Min guard Gait Distance (Feet): 3 Feet Assistive device: Rolling walker (2 wheels) Gait Pattern/deviations: Step-to pattern, Narrow base of support, Trunk flexed       General Gait Details: Lateral side stepping to [facility]; PT encouraged additional gait distance in room, patient refused d/t abdominal pain/discomfort.   Stairs             Wheelchair Mobility    Modified Rankin (Stroke Patients Only)       Balance Overall balance assessment: Needs assistance Sitting-balance support: Bilateral upper extremity supported, Feet supported Sitting balance-Leahy Scale: Fair     Standing balance support: Bilateral upper extremity supported, During functional activity, Reliant on assistive device for balance Standing balance-Leahy Scale: Fair Standing balance comment: steadying assist; heavy reliance on RW. Mild instability without use of RW  Cognition Arousal/Alertness: Awake/alert Behavior During Therapy: WFL for tasks assessed/performed Overall Cognitive Status: Within Functional Limits  for tasks assessed                                          Exercises Other Exercises Other Exercises: Son present and requesting information on need for RW vs. Rollator. PT educating right now RW is better option for stability    General Comments        Pertinent Vitals/Pain Pain Assessment Pain Assessment: 0-10 Pain Score: 6  Pain Location: abdomen Pain Descriptors / Indicators: Aching Pain Intervention(s): Limited activity within patient's tolerance, Monitored during session    Home Living                          Prior Function            PT Goals (current goals can now be found in the care plan section) Acute Rehab PT Goals PT Goal Formulation: With patient Time For Goal Achievement: 09/29/22 Potential to Achieve Goals: Fair Progress towards PT goals: Progressing toward goals    Frequency    Min 2X/week      PT Plan Current plan remains appropriate    Co-evaluation              AM-PAC PT "6 Clicks" Mobility   Outcome Measure  Help needed turning from your back to your side while in a flat bed without using bedrails?: A Little Help needed moving from lying on your back to sitting on the side of a flat bed without using bedrails?: A Little Help needed moving to and from a bed to a chair (including a wheelchair)?: A Little Help needed standing up from a chair using your arms (e.g., wheelchair or bedside chair)?: A Little Help needed to walk in hospital room?: A Little Help needed climbing 3-5 steps with a railing? : A Lot 6 Click Score: 17    End of Session Equipment Utilized During Treatment: Gait belt Activity Tolerance: Patient tolerated treatment well Patient left: in bed;with call bell/phone within reach;with bed alarm set;with family/visitor present Nurse Communication: Mobility status PT Visit Diagnosis: Unsteadiness on feet (R26.81);Other abnormalities of gait and mobility (R26.89);Repeated falls (R29.6);Muscle  weakness (generalized) (M62.81);Pain Pain - Right/Left: Left Pain - part of body:  (Abdomen)     Time: 2355-7322 PT Time Calculation (min) (ACUTE ONLY): 17 min  Charges:  $Therapeutic Activity: 8-22 mins                     Creed Copper Fairly, PT, DPT 09/17/22 3:05 PM

## 2022-09-18 DIAGNOSIS — K5100 Ulcerative (chronic) pancolitis without complications: Secondary | ICD-10-CM | POA: Diagnosis not present

## 2022-09-18 MED ORDER — MORPHINE SULFATE (PF) 2 MG/ML IV SOLN: 1.0000 mg | Freq: Four times a day (QID) | INTRAVENOUS | Status: DC | PRN

## 2022-09-18 MED ORDER — HYDROCODONE-ACETAMINOPHEN 5-325 MG PO TABS
1.0000 | ORAL_TABLET | ORAL | Status: DC | PRN
  Administered 2022-09-18 – 2022-09-21 (×9): 1 via ORAL
  Filled 2022-09-18 (×11): qty 1

## 2022-09-18 NOTE — Progress Notes (Signed)
[facility]  - [place] at [facility]   PATIENT NAME: Felicia Hughes    MR#:  [id number]  DATE OF BIRTH:  02/12/1934  SUBJECTIVE:  patient sitting up in the chair. Dter at bedside. Patient tolerating soft  diet. ] Intermittent abdominal pain.   VITALS:  Blood pressure (!) 160/88, pulse 89, temperature 98.7 F (37.1 C), resp. rate 18, height 5' (1.524 m), weight 52.6 kg, SpO2 93 %.  PHYSICAL EXAMINATION:   GENERAL:  89 y.o.-year-old patient with no acute distress.  LUNGS: Normal breath sounds bilaterally, no wheezing CARDIOVASCULAR: S1, S2 normal. No murmur   ABDOMEN: Soft, nontender, nondistended.surgical incision ok EXTREMITIES: No  edema b/l.    NEUROLOGIC: nonfocal  patient is alert and awake  LABORATORY PANEL:  CBC Recent Labs  Lab 09/15/22 0513  WBC 14.3*  HGB 12.2  HCT 37.6  PLT 442*     Chemistries  Recent Labs  Lab 09/15/22 0513 09/16/22 0424  NA 135 136  K 4.4 3.6  CL 108 104  CO2 17* 25  GLUCOSE 130* 113*  BUN 29* 35*  CREATININE 1.16* 1.00  CALCIUM 8.0* 8.2*  MG 2.6*  --   AST 27 34  ALT 19 21  ALKPHOS 47 57  BILITOT 0.7 0.6     Assessment and Plan  89 y.o female with significant PMH of CKD, GERD, HLD, HTN, s/p cholecystectomy who presented to the ED with chief complaints of worsening abdominal pain.  patient recently had an open right incarcerated inguinal hernia repair on 6/14 with Dr. Everlene Farrier.  Since discharge she has had very mild right-sided abdominal pain for a few days that worsened on the day of admission with associated nausea, vomiting and diarrhea.  CT abdomen with pelvis showing resolution of small bowel obstruction post inguinal hernia repair. Patient complained of worsening abdominal pain repeat CT abdomen pelvis obtained which showed perforated viscus.  General surgery consulted and patient taken urgently to the OR for repair.   6/24: CTA abdomen and pelvis IMPRESSION: 1. Interval development of diffuse  pneumoperitoneum and increasing free fluid throughout the abdomen and pelvis, consistent with perforated viscus. Site of perforation may be within the gastric antrum/pylorus, where prominent wall thickening and adjacent extraluminal gas are identified reference images 37-43 of series 2. Surgical consultation recommended. 2. Stable diffuse wall thickening of the distal small bowel and proximal colon consistent with enterocolitis. 3. Trace bilateral pleural effusions. 4. Hiatal hernia. 5.  Aortic Atherosclerosis (ICD10-I70.0).  Acute Abdomen in the setting of Perforated Viscus Recent Incarcerated Inguinal Hernia S/P Repair 6/14 --Repeat CT abdomen pelvis  6/24 shows with free air and fluid in her abdomen consistent with viscus perforation --S/p ex lap  for repair of Perforated Duodenal Ucer POD # 1 -Keep NPO  -cont IVF  -NG tube to intermittent suction -IV broad-spectrum as below -General surgery following --dr Hazle Quant --6/26-- upper G.I. series done today to evaluate for any leak. Await results in further surgery recommendation --6/27-- upper G.I. series negative for leak. Patient tolerating full liquid diet. Advanced soft. Had a bowel movement. Abdominal surgical drain removed per general surgery. --6/28-- overall improving. Patient intermittently requires IV morphine for abdominal pain. Encouraged her to sit out in the chair more. Will continue to see how she does over the weekend   #Sepsis without septic shock secondary to perforated viscus -Supplemental oxygen as needed, to maintain SpO2 > 90% -Lactic acid 4.0--5.7--1.1 -Monitor WBC/ fever curve -IV antibiotics: zosyn  -IVF hydration as needed -PRN Pressors  for MAP goal >65 -- sepsis resolved  # AKI on CKD Stage III # Hyponatremia # Mild metabolic acidosis--resolved - Ensure adequate renal perfusion - Avoid nephrotoxic agents as able - Replace electrolytes as indicated --bicarb gtt--now d/c    #HLD   #HTN -Atorvastatin 20mg  PO at bedtime once able to tolerate po -Resume BP meds as renal and BP permits --Prn hydralazine -- resume losartan  Slow improvement. Continues to have intermittent abdominal pain requiring IV pain meds. Will continue to monitor and see if patient remains stable with oral opioids over the weekend.  Procedures: Repair of perforated duodenal ulcer.                      Omental [name]) patch placement Family communication : daughter at bedside  Consults :gen surgery CODE STATUS: full DVT Prophylaxis :heparin Level of care: Med-Surg Status is: Inpatient Remains inpatient appropriate because: POD #4 perforated viscous repair. Improving. Discharge next week    TOTAL TIME TAKING CARE OF THIS PATIENT: 35 minutes.  >50% time spent on counselling and coordination of care  Note: This dictation was prepared with Dragon dictation along with smaller phrase technology. Any transcriptional errors that result from this process are unintentional.  Enedina Finner M.D    [facility]   CC: Primary care physician; Barbette Reichmann, MD

## 2022-09-18 NOTE — Progress Notes (Signed)
       CROSS COVER NOTE  NAME: Felicia Hughes MRN: [id number] DOB : 1933-04-08    Concern as stated by nurse / staff    Patient admit for previous hernia repair, now has perforated viscous. Telemetry just called, said patient had an episode of SVT, HR in the 160's, this ran for about 30 seconds per telemetry. Got new set of vitals, vitals normal, mild temp of 99. Patient is showing tachycardia from 101 to 114. Mild low calcium in the am at 8.2. Potassium ok. Reassessed the patient, patient has no complaints, appears stable. Do we wanna do an EKG?     Pertinent findings on chart review: H & P, progress, labs and imaging reviewed   Assessment and  Interventions   Assessment: VSS EKG reviewed by me ST 100 with PACs Plan: High risk for a fib Continue to mnitor on tele       .blm

## 2022-09-18 NOTE — Progress Notes (Signed)
PT Cancellation Note  Patient Details Name: Felicia Hughes MRN: [id number] DOB: 1934/03/12   Cancelled Treatment:     Multiple PT attempts made this morning. First attempt pt eating breakfast, second attempt pt on [facility] with RN staff, third attempt requested author return this afternoon. Acute PT will continue to follow and progress per current POC.    Rushie Chestnut 09/18/2022, 12:05 PM

## 2022-09-18 NOTE — Progress Notes (Signed)
PT Cancellation Note  Patient Details Name: Felicia Hughes MRN: [id number] DOB: [birth year]   Cancelled Treatment:     PT attempted 4 x throughout the day. 2 attempts this afternoon, pt was getting to [facility] both times. Last attempt pt was working with OT. Author will return tomorrow and continue to follow per current POC.   Rushie Chestnut 09/18/2022, 4:33 PM

## 2022-09-18 NOTE — Progress Notes (Signed)
Patient ID: Felicia Hughes, female   DOB: 1933/07/16, 89 y.o.   MRN: [id number]     SURGICAL PROGRESS NOTE   Hospital Day(s): 4.   Interval History: Patient seen and examined, no acute events or new complaints overnight. Patient reports feeling well this morning.  She endorses that she has some pain overnight that required morphine.  This morning pain under control.  She tolerated small amount of the soft diet.  No nausea or vomiting.  She had a bowel movement.  As per chart review patient had an episode of supraventricular tachycardia.  Resolved without intervention.  EKG shows heart rate of 100, sinus.  This morning heart rates 90  Vital signs in last 24 hours: [min-max] current  Temp:  [98.3 F (36.8 C)-99 F (37.2 C)] 98.8 F (37.1 C) (06/28 0402) Pulse Rate:  [87-95] 90 (06/28 0402) Resp:  [18-20] 18 (06/27 2134) BP: (124-142)/(60-74) 142/68 (06/28 0402) SpO2:  [91 %-95 %] 92 % (06/28 0402) Weight:  [52.6 kg] 52.6 kg (06/28 0402)     Height: 5' (152.4 cm) Weight: 52.6 kg BMI (Calculated): 22.65   Physical Exam:  Constitutional: alert, cooperative and no distress  Respiratory: breathing non-labored at rest  Cardiovascular: regular rate and sinus rhythm  Gastrointestinal: soft, non-tender, and non-distended  Labs:     Latest Ref Rng & Units 09/15/2022    5:13 AM 09/14/2022    7:54 AM 09/05/2022    5:30 AM  CBC  WBC 4.0 - 10.5 K/uL 14.3  27.4  16.5   Hemoglobin 12.0 - 15.0 g/dL 40.9  81.1  91.4   Hematocrit 36.0 - 46.0 % 37.6  41.8  32.8   Platelets 150 - 400 K/uL 442  530  320       Latest Ref Rng & Units 09/16/2022    4:24 AM 09/15/2022    5:13 AM 09/14/2022    8:56 PM  CMP  Glucose 70 - 99 mg/dL 782  956  213   BUN 8 - 23 mg/dL 35  29  29   Creatinine 0.44 - 1.00 mg/dL 0.86  5.78  4.69   Sodium 135 - 145 mmol/L 136  135  134   Potassium 3.5 - 5.1 mmol/L 3.6  4.4  4.2   Chloride 98 - 111 mmol/L 104  108  104   CO2 22 - 32 mmol/L 25  17  18    Calcium 8.9 - 10.3 mg/dL 8.2   8.0  8.8   Total Protein 6.5 - 8.1 g/dL 5.2  4.6    Total Bilirubin 0.3 - 1.2 mg/dL 0.6  0.7    Alkaline Phos 38 - 126 U/L 57  47    AST 15 - 41 U/L 34  27    ALT 0 - 44 U/L 21  19      Imaging studies: No new pertinent imaging studies   Assessment/Plan:  89 y.o. female with peptic ulcer disease with perforation 4 Day Post-Op s/p duodenal ulcer repair.   -With episode of tachycardia overnight resolved without intervention.  This morning however is [place]. -No fever -Patient required morphine last night.  Will add Norco to see if pain can stay controlled with oral medication -Will continue with soft diet.  Will assess for toleration with breakfast -Encouraged the patient to continue ambulation  Gae Gallop, MD

## 2022-09-18 NOTE — TOC Progression Note (Signed)
Transition of Care [facility]) - Progression Note    Patient Details  Name: Felicia Hughes MRN: [id number] Date of Birth: 02-Dec-1933  Transition of Care [facility]) CM/SW Contact  Kreg Shropshire, RN Phone Number: 09/18/2022, 10:40 AM  Clinical Narrative:    1040- Insurance auth was approved through Tuesday 09/22/22. MD stated pt to be d/c to [facility] [place] on Monday. Called Pam admissions coordinator at [phone] to inform that pt will be inpatient at the hospital until Monday 09/22/22.    Expected Discharge Plan: Skilled Nursing Facility Barriers to Discharge: Continued Medical Work up  Expected Discharge Plan and Services       Living arrangements for the past 2 months: Assisted Living Facility ([facility])                                       Social Determinants of Health (SDOH) Interventions SDOH Screenings   Food Insecurity: No Food Insecurity (09/14/2022)  Housing: Low Risk  (09/14/2022)  Transportation Needs: No Transportation Needs (09/14/2022)  Utilities: Not At Risk (09/14/2022)  Tobacco Use: Low Risk  (09/15/2022)    Readmission Risk Interventions    09/05/2022   11:39 AM  Readmission Risk Prevention Plan  Post Dischage Appt Complete  Medication Screening Complete  Transportation Screening Complete

## 2022-09-18 NOTE — Progress Notes (Signed)
Occupational Therapy Treatment Patient Details Name: Felicia Hughes MRN: [id number] DOB: [birth year] Today's Date: 09/18/2022   History of present illness 89 y.o female with significant PMH of CKD, GERD, HLD, HTN, s/p cholecystectomy who presented to the ED with chief complaints of worsening abdominal pain. Pt had an open right incarcerated inguinal hernia repair on 6/14 and since discharge has had mild right-sided abdominal pain, worsening day of admission with nausea, vomiting, and diarrhea. Repeat imaging showed perforated viscus. PT admitted to hospital service with enterocolitis found with acute abdomen and pneumoperitoneum consistent with perforated viscus. Now s/p exploratory laparotomy and duodenal ulcer repair on 6/24.   OT comments  Pt seen for OT tx this date. Pt endorses fatigue and mild abdominal pain but agreeable to session focused on toileting. BSC adjusted lower for improved height for pt comfort. CGA for bed mobility, slow and effortful. Pt required VC for sequencing to improve transfer EOB with RW and VC to not leave the RW behind while taking a few steps and turning to sit on the [facility]. Set up for pericare using lateral lean. HR up to 120's with exertion. Pt progressing towards goals, continues to benefit. Continues to be limited by decr balance, activity tolerance, strength, and abdominal pain. Recommendations remain appropriate.    Recommendations for follow up therapy are one component of a multi-disciplinary discharge planning process, led by the attending physician.  Recommendations may be updated based on patient status, additional functional criteria and insurance authorization.    Assistance Recommended at Discharge Frequent or constant Supervision/Assistance  Patient can return home with the following  A little help with walking and/or transfers;Assistance with cooking/housework;Help with stairs or ramp for entrance;Assist for transportation;Direct supervision/assist for  medications management;A little help with bathing/dressing/bathroom   Equipment Recommendations  BSC/3in1    Recommendations for Other Services      Precautions / Restrictions Precautions Precautions: Fall Precaution Comments: abdominal incisions Restrictions Weight Bearing Restrictions: No       Mobility Bed Mobility Overal bed mobility: Needs Assistance Bed Mobility: Supine to Sit, Sit to Supine     Supine to sit: Min guard Sit to supine: Min guard   General bed mobility comments: increased time/effort    Transfers Overall transfer level: Needs assistance Equipment used: Rolling walker (2 wheels) Transfers: Sit to/from Stand Sit to Stand: Min guard           General transfer comment: VC to scoot forward first     Balance Overall balance assessment: Needs assistance Sitting-balance support: Single extremity supported, Feet supported Sitting balance-Leahy Scale: Fair     Standing balance support: Bilateral upper extremity supported, During functional activity, Reliant on assistive device for balance Standing balance-Leahy Scale: Fair                             ADL either performed or assessed with clinical judgement   ADL Overall ADL's : Needs assistance/impaired                         Toilet Transfer: Min guard;BSC/3in1;Rolling walker (2 wheels) Toilet Transfer Details (indicate cue type and reason): VC for RW safety Toileting- Clothing Manipulation and Hygiene: Sitting/lateral lean;Supervision/safety;Set up              Extremity/Trunk Assessment              Vision       Perception     Praxis  Cognition Arousal/Alertness: Awake/alert Behavior During Therapy: WFL for tasks assessed/performed Overall Cognitive Status: Within Functional Limits for tasks assessed                                          Exercises      Shoulder Instructions       General Comments      Pertinent  Vitals/ Pain       Pain Assessment Pain Assessment: 0-10 Pain Score: 4  Pain Location: abdomen Pain Descriptors / Indicators: Aching Pain Intervention(s): Limited activity within patient's tolerance, Monitored during session, Repositioned  Home Living                                          Prior Functioning/Environment              Frequency  Min 2X/week        Progress Toward Goals  OT Goals(current goals can now be found in the care plan section)  Progress towards OT goals: Progressing toward goals  Acute Rehab OT Goals Patient Stated Goal: get better and go back to [facility] with Lollie Sails OT Goal Formulation: With patient Time For Goal Achievement: 09/29/22 Potential to Achieve Goals: Good  Plan Frequency remains appropriate;Discharge plan remains appropriate    Co-evaluation                 AM-PAC OT "6 Clicks" Daily Activity     Outcome Measure   Help from another person eating meals?: None Help from another person taking care of personal grooming?: None Help from another person toileting, which includes using toliet, bedpan, or urinal?: A Little Help from another person bathing (including washing, rinsing, drying)?: A Lot Help from another person to put on and taking off regular upper body clothing?: None Help from another person to put on and taking off regular lower body clothing?: A Lot 6 Click Score: 19    End of Session Equipment Utilized During Treatment: Rolling walker (2 wheels)  OT Visit Diagnosis: Other abnormalities of gait and mobility (R26.89);Pain Pain - Right/Left:  (abdomen)   Activity Tolerance Patient tolerated treatment well   Patient Left in bed;with call bell/phone within reach;with bed alarm set   Nurse Communication          Time: 1610-9604 OT Time Calculation (min): 16 min  Charges: OT General Charges $OT Visit: 1 Visit OT Treatments $Self Care/Home Management : 8-22 mins  Arman Filter., MPH, MS, OTR/L ascom [phone] 09/18/22, 3:48 PM

## 2022-09-19 DIAGNOSIS — K5100 Ulcerative (chronic) pancolitis without complications: Secondary | ICD-10-CM | POA: Diagnosis not present

## 2022-09-19 MED ORDER — LOPERAMIDE HCL 2 MG PO CAPS
2.0000 mg | ORAL_CAPSULE | ORAL | Status: DC | PRN
  Administered 2022-09-19 – 2022-09-21 (×2): 2 mg via ORAL
  Filled 2022-09-19 (×3): qty 1

## 2022-09-19 MED ORDER — AMOXICILLIN-POT CLAVULANATE 875-125 MG PO TABS
1.0000 | ORAL_TABLET | Freq: Two times a day (BID) | ORAL | Status: DC
  Administered 2022-09-19 – 2022-09-21 (×5): 1 via ORAL
  Filled 2022-09-19 (×5): qty 1

## 2022-09-19 NOTE — Progress Notes (Signed)
Patient ID: Felicia Hughes, female   DOB: 12/01/1933, 89 y.o.   MRN: [id number]     SURGICAL PROGRESS NOTE   Hospital Day(s): 5.   Interval History: Patient seen and examined, no acute events or new complaints overnight. Patient reports feeling uncomfortable due to diarrhea. Abdominal soreness but pain controlled. No nausea. Tolerated soft diet, small amounts.  Vital signs in last 24 hours: [min-max] current  Temp:  [98 F (36.7 C)-99.1 F (37.3 C)] 98 F (36.7 C) (06/29 0832) Pulse Rate:  [82-92] 82 (06/29 0832) Resp:  [18] 18 (06/29 0832) BP: (143-159)/(77-90) 159/90 (06/29 0832) SpO2:  [94 %-97 %] 97 % (06/29 0832) Weight:  [50.7 kg] 50.7 kg (06/29 0355)     Height: 5' (152.4 cm) Weight: 50.7 kg BMI (Calculated): 21.83   Physical Exam:  Constitutional: alert, cooperative and no distress  Respiratory: breathing non-labored at rest  Cardiovascular: regular rate and sinus rhythm  Gastrointestinal: soft, non-tender, and non-distended. Wound is dry and clean.   Labs:     Latest Ref Rng & Units 09/15/2022    5:13 AM 09/14/2022    7:54 AM 09/05/2022    5:30 AM  CBC  WBC 4.0 - 10.5 K/uL 14.3  27.4  16.5   Hemoglobin 12.0 - 15.0 g/dL 40.9  81.1  91.4   Hematocrit 36.0 - 46.0 % 37.6  41.8  32.8   Platelets 150 - 400 K/uL 442  530  320       Latest Ref Rng & Units 09/16/2022    4:24 AM 09/15/2022    5:13 AM 09/14/2022    8:56 PM  CMP  Glucose 70 - 99 mg/dL 782  956  213   BUN 8 - 23 mg/dL 35  29  29   Creatinine 0.44 - 1.00 mg/dL 0.86  5.78  4.69   Sodium 135 - 145 mmol/L 136  135  134   Potassium 3.5 - 5.1 mmol/L 3.6  4.4  4.2   Chloride 98 - 111 mmol/L 104  108  104   CO2 22 - 32 mmol/L 25  17  18    Calcium 8.9 - 10.3 mg/dL 8.2  8.0  8.8   Total Protein 6.5 - 8.1 g/dL 5.2  4.6    Total Bilirubin 0.3 - 1.2 mg/dL 0.6  0.7    Alkaline Phos 38 - 126 U/L 57  47    AST 15 - 41 U/L 34  27    ALT 0 - 44 U/L 21  19      Imaging studies: No new pertinent imaging  studies   Assessment/Plan:  89 y.o. female with peptic ulcer disease with perforation 5 Day Post-Op s/p duodenal ulcer repair.   -Not comfortable due to diarrhea. Will continue with observation now that she is in soft diet.  -No fever or tachycardia.  -Continue pain management -Change antibiotics to oral antibiotics to complete  -Encourage to get out of bed.   Gae Gallop, MD

## 2022-09-19 NOTE — Progress Notes (Signed)
[facility]  - [place] [facility]   PATIENT NAME: Felicia Hughes    MR#:  [id number]  DATE OF BIRTH:  Mar 10, 1934  SUBJECTIVE:  patient sitting up in the bed Loose BM. Not eating much solids NO family at bedside. VITALS:  Blood pressure (!) 159/90, pulse 82, temperature 98 F (36.7 C), resp. rate 18, height 5' (1.524 m), weight 50.7 kg, SpO2 97 %.  PHYSICAL EXAMINATION:   GENERAL:  89 y.o.-year-old patient with no acute distress.  LUNGS: Normal breath sounds bilaterally, no wheezing CARDIOVASCULAR: S1, S2 normal. No murmur   ABDOMEN: Soft, nontender, nondistended.surgical incision ok EXTREMITIES: No  edema b/l.    NEUROLOGIC: nonfocal  patient is alert and awake  LABORATORY PANEL:  CBC Recent Labs  Lab 09/15/22 0513  WBC 14.3*  HGB 12.2  HCT 37.6  PLT 442*     Chemistries  Recent Labs  Lab 09/15/22 0513 09/16/22 0424  NA 135 136  K 4.4 3.6  CL 108 104  CO2 17* 25  GLUCOSE 130* 113*  BUN 29* 35*  CREATININE 1.16* 1.00  CALCIUM 8.0* 8.2*  MG 2.6*  --   AST 27 34  ALT 19 21  ALKPHOS 47 57  BILITOT 0.7 0.6     Assessment and Plan  89 y.o female with significant PMH of CKD, GERD, HLD, HTN, s/p cholecystectomy who presented to the ED with chief complaints of worsening abdominal pain.  patient recently had an open right incarcerated inguinal hernia repair on 6/14 with Dr. Everlene Farrier.  Since discharge she has had very mild right-sided abdominal pain for a few days that worsened on the day of admission with associated nausea, vomiting and diarrhea.  CT abdomen with pelvis showing resolution of small bowel obstruction post inguinal hernia repair. Patient complained of worsening abdominal pain repeat CT abdomen pelvis obtained which showed perforated viscus.  General surgery consulted and patient taken urgently to the OR for repair.   6/24: CTA abdomen and pelvis IMPRESSION: 1. Interval development of diffuse pneumoperitoneum and increasing free fluid  throughout the abdomen and pelvis, consistent with perforated viscus. Site of perforation may be within the gastric antrum/pylorus, where prominent wall thickening and adjacent extraluminal gas are identified reference images 37-43 of series 2. Surgical consultation recommended. 2. Stable diffuse wall thickening of the distal small bowel and proximal colon consistent with enterocolitis. 3. Trace bilateral pleural effusions. 4. Hiatal hernia. 5.  Aortic Atherosclerosis (ICD10-I70.0).  Acute Abdomen in the setting of Perforated Viscus Recent Incarcerated Inguinal Hernia S/P Repair 6/14 --Repeat CT abdomen pelvis  6/24 shows with free air and fluid in her abdomen consistent with viscus perforation --S/p ex lap  for repair of Perforated Duodenal Ucer POD # 1 -Keep NPO  -cont IVF  -NG tube to intermittent suction -IV broad-spectrum as below -General surgery following --dr Hazle Quant --6/26-- upper G.I. series done today to evaluate for any leak. Await results in further surgery recommendation --6/27-- upper G.I. series negative for leak. Patient tolerating full liquid diet. Advanced soft. Had a bowel movement. Abdominal surgical drain removed per general surgery. --6/28-- overall improving. Patient intermittently requires IV morphine for abdominal pain. Encouraged her to sit out in the chair more. Will continue to see how she does over the weekend --6/29--some loose BM. Per stff not taking much solids yet. Prn lomotil. Change to po augmentin--d/w surgery   #Sepsis without septic shock secondary to perforated viscus -Supplemental oxygen as needed, to maintain SpO2 > 90% -Lactic acid 4.0--5.7--1.1 -Monitor  WBC/ fever curve -IV antibiotics: zosyn  -IVF hydration as needed -PRN Pressors for MAP goal >65 -- sepsis resolved  # AKI on CKD Stage III # Hyponatremia # Mild metabolic acidosis--resolved - Ensure adequate renal perfusion - Avoid nephrotoxic agents as able - Replace  electrolytes as indicated --bicarb gtt--now d/c    #HLD  #HTN -Atorvastatin 20mg  PO at bedtime once able to tolerate po -Resume BP meds as renal and BP permits --Prn hydralazine -- resume losartan  Slow improvement. Continues to have intermittent abdominal pain requiring IV pain meds. Will continue to monitor and see if patient remains stable with oral opioids over the weekend.  Procedures: Repair of perforated duodenal ulcer.                      Omental [name]) patch placement Family communication : daughter Kriste Basque on the phone Consults :gen surgery CODE STATUS: full DVT Prophylaxis :heparin Level of care: Med-Surg Status is: Inpatient Remains inpatient appropriate because: POD #5 perforated viscous repair. Improving. Discharge next week    TOTAL TIME TAKING CARE OF THIS PATIENT: 35 minutes.  >50% time spent on counselling and coordination of care  Note: This dictation was prepared with Dragon dictation along with smaller phrase technology. Any transcriptional errors that result from this process are unintentional.  Enedina Finner M.D    [facility]   CC: Primary care physician; Barbette Reichmann, MD

## 2022-09-19 NOTE — Progress Notes (Signed)
Physical Therapy Treatment Patient Details Name: Felicia Hughes MRN: [id number] DOB: April 18, 1933 Today's Date: 09/19/2022   History of Present Illness 89 y.o female with significant PMH of CKD, GERD, HLD, HTN, s/p cholecystectomy who presented to the ED with chief complaints of worsening abdominal pain. Pt had an open right incarcerated inguinal hernia repair on 6/14 and since discharge has had mild right-sided abdominal pain, worsening day of admission with nausea, vomiting, and diarrhea. Repeat imaging showed perforated viscus. PT admitted to hospital service with enterocolitis found with acute abdomen and pneumoperitoneum consistent with perforated viscus. Now s/p exploratory laparotomy and duodenal ulcer repair on 6/24.    PT Comments    Pt was sitting EOB with RN at bedside upon arrival. Pt just used restroom on [facility] prior to arrival. Pt agrees to session but is limited by pain and fatigue. She was able to stand and ambulate with RW and with +1 UE support however pt does much better with use fo RW. At baseline, pt does not use AD and is completely independent. She will benefit from continued skilled PT at DC to maximize her independence and safety while assisting pt back to PLOF. Pt lives at [facility] and is planning to DC to skilled rehab side prior to returning to independent living.     Recommendations for follow up therapy are one component of a multi-disciplinary discharge planning process, led by the attending physician.  Recommendations may be updated based on patient status, additional functional criteria and insurance authorization.     Assistance Recommended at Discharge Intermittent Supervision/Assistance  Patient can return home with the following A little help with walking and/or transfers;A little help with bathing/dressing/bathroom;Assist for transportation;Help with stairs or ramp for entrance   Equipment Recommendations  Rolling walker (2 wheels)        Precautions / Restrictions Precautions Precautions: Fall Precaution Comments: abdominal incisions Restrictions Weight Bearing Restrictions: No     Mobility  Bed Mobility Overal bed mobility: Needs Assistance Bed Mobility: Sit to Supine  Sit to supine: Min assist General bed mobility comments: Pt was seated EOB upon arrival. Needed min assist to return to supine after OOB activity    Transfers Overall transfer level: Needs assistance Equipment used: Rolling walker (2 wheels) Transfers: Sit to/from Stand Sit to Stand: Min guard     Ambulation/Gait Ambulation/Gait assistance: Min guard, Min assist Gait Distance (Feet): 50 Feet Assistive device: Rolling walker (2 wheels), 1 person hand held assist Gait Pattern/deviations: Step-to pattern, Narrow base of support, Trunk flexed Gait velocity: decreased  General Gait Details: Pt was able to ambulate with RW and with +1 HHA. does not use AD at baseline. limited by pain and activity tolerance   Balance Overall balance assessment: Needs assistance Sitting-balance support: Feet supported Sitting balance-Leahy Scale: Good  Standing balance support: Single extremity supported, During functional activity Standing balance-Leahy Scale: Fair       Cognition Arousal/Alertness: Awake/alert Behavior During Therapy: WFL for tasks assessed/performed Overall Cognitive Status: Within Functional Limits for tasks assessed              Pertinent Vitals/Pain Pain Assessment Pain Assessment: 0-10 Pain Score: 4  Pain Location: abdomen Pain Descriptors / Indicators: Aching Pain Intervention(s): Limited activity within patient's tolerance, Monitored during session, Premedicated before session, Repositioned     PT Goals (current goals can now be found in the care plan section) Acute Rehab PT Goals Patient Stated Goal: "Return to [employer]." Progress towards PT goals: Progressing toward goals  Frequency    Min  2X/week      PT Plan Discharge plan needs to be updated       AM-PAC PT "6 Clicks" Mobility   Outcome Measure  Help needed turning from your back to your side while in a flat bed without using bedrails?: A Little Help needed moving from lying on your back to sitting on the side of a flat bed without using bedrails?: A Little Help needed moving to and from a bed to a chair (including a wheelchair)?: A Little Help needed standing up from a chair using your arms (e.g., wheelchair or bedside chair)?: A Little Help needed to walk in hospital room?: A Little Help needed climbing 3-5 steps with a railing? : A Lot 6 Click Score: 17    End of Session   Activity Tolerance: Patient tolerated treatment well;Patient limited by fatigue;Patient limited by pain Patient left: in bed;with call bell/phone within reach;with bed alarm set;with family/visitor present Nurse Communication: Mobility status PT Visit Diagnosis: Unsteadiness on feet (R26.81);Other abnormalities of gait and mobility (R26.89);Repeated falls (R29.6);Muscle weakness (generalized) (M62.81);Pain Pain - Right/Left: Left     Time: 1610-9604 PT Time Calculation (min) (ACUTE ONLY): 13 min  Charges:  $Gait Training: 8-22 mins                     Jetta Lout PTA 09/19/22, 3:29 PM

## 2022-09-19 NOTE — Progress Notes (Signed)
A consult was placed to the hospital's IV Nurse earlier this shift -- for new IV access for the pt;  this writer has been to see the pt twice, but the pt has been in the bathroom.  RN aware and has been asked to assess for a new site as it will be awhile until this Clinical research associate can return a 3rd time.

## 2022-09-20 DIAGNOSIS — K5100 Ulcerative (chronic) pancolitis without complications: Secondary | ICD-10-CM | POA: Diagnosis not present

## 2022-09-20 LAB — BASIC METABOLIC PANEL
Anion gap: 10 (ref 5–15)
BUN: 15 mg/dL (ref 8–23)
CO2: 25 mmol/L (ref 22–32)
Calcium: 7.9 mg/dL — ABNORMAL LOW (ref 8.9–10.3)
Chloride: 102 mmol/L (ref 98–111)
Creatinine, Ser: 0.69 mg/dL (ref 0.44–1.00)
GFR, Estimated: >60 mL/min (ref >60)
Glucose, Bld: 84 mg/dL (ref 70–99)
Potassium: 2.2 mmol/L — CL (ref 3.5–5.1)
Sodium: 137 mmol/L (ref 135–145)

## 2022-09-20 LAB — MAGNESIUM: Magnesium: 1.9 mg/dL (ref 1.7–2.4)

## 2022-09-20 MED ORDER — LOSARTAN POTASSIUM 50 MG PO TABS
50.0000 mg | ORAL_TABLET | Freq: Every day | ORAL | Status: DC
  Administered 2022-09-21: 50 mg via ORAL
  Filled 2022-09-20: qty 1

## 2022-09-20 MED ORDER — POTASSIUM CHLORIDE CRYS ER 20 MEQ PO TBCR
40.0000 meq | EXTENDED_RELEASE_TABLET | Freq: Three times a day (TID) | ORAL | Status: DC
  Administered 2022-09-20 – 2022-09-21 (×3): 40 meq via ORAL
  Filled 2022-09-20 (×3): qty 2

## 2022-09-20 MED ORDER — MAGNESIUM SULFATE 2 GM/50ML IV SOLN
2.0000 g | Freq: Once | INTRAVENOUS | Status: AC
  Administered 2022-09-20: 2 g via INTRAVENOUS
  Filled 2022-09-20: qty 50

## 2022-09-20 MED ORDER — ENOXAPARIN SODIUM 40 MG/0.4ML IJ SOSY: 40.0000 mg | PREFILLED_SYRINGE | INTRAMUSCULAR | Status: DC

## 2022-09-20 MED ORDER — POTASSIUM CHLORIDE 20 MEQ PO PACK
40.0000 meq | PACK | Freq: Three times a day (TID) | ORAL | Status: DC
  Administered 2022-09-20: 40 meq via ORAL
  Filled 2022-09-20 (×2): qty 2

## 2022-09-20 NOTE — Progress Notes (Signed)
A consult was placed the IV Nurse for IV access; upon arrival to the room, the pt asked to have pain medicine and use the restroom first;  Nurse aware that pt will remain on a list, and this writer will return when able;

## 2022-09-20 NOTE — Progress Notes (Addendum)
[facility]  - [place] [facility]   PATIENT NAME: Felicia Hughes    MR#:  [id number]  DATE OF BIRTH:  09-Sep-1933  SUBJECTIVE:  patient sitting up in the bed Diarrhea improving NO family at bedside. VITALS:  Blood pressure (!) 160/74, pulse 85, temperature 97.9 F (36.6 C), resp. rate 18, height 5' (1.524 m), weight 48.2 kg, SpO2 97 %.  PHYSICAL EXAMINATION:   GENERAL:  89 y.o.-year-old patient with no acute distress.  LUNGS: Normal breath sounds bilaterally, no wheezing CARDIOVASCULAR: S1, S2 normal. No murmur   ABDOMEN: Soft, nontender, nondistended.surgical incision ok EXTREMITIES: No  edema b/l.    NEUROLOGIC: nonfocal  patient is alert and awake  LABORATORY PANEL:  CBC Recent Labs  Lab 09/15/22 0513  WBC 14.3*  HGB 12.2  HCT 37.6  PLT 442*     Chemistries  Recent Labs  Lab 09/16/22 0424 09/20/22 1055  NA 136 137  K 3.6 2.2*  CL 104 102  CO2 25 25  GLUCOSE 113* 84  BUN 35* 15  CREATININE 1.00 0.69  CALCIUM 8.2* 7.9*  MG  --  1.9  AST 34  --   ALT 21  --   ALKPHOS 57  --   BILITOT 0.6  --      Assessment and Plan  89 y.o female with significant PMH of CKD, GERD, HLD, HTN, s/p cholecystectomy who presented to the ED with chief complaints of worsening abdominal pain.  patient recently had an open right incarcerated inguinal hernia repair on 6/14 with Dr. Everlene Farrier.  Since discharge she has had very mild right-sided abdominal pain for a few days that worsened on the day of admission with associated nausea, vomiting and diarrhea.  CT abdomen with pelvis showing resolution of small bowel obstruction post inguinal hernia repair. Patient complained of worsening abdominal pain repeat CT abdomen pelvis obtained which showed perforated viscus.  General surgery consulted and patient taken urgently to the OR for repair.   6/24: CTA abdomen and pelvis IMPRESSION: 1. Interval development of diffuse pneumoperitoneum and increasing free fluid  throughout the abdomen and pelvis, consistent with perforated viscus. Site of perforation may be within the gastric antrum/pylorus, where prominent wall thickening and adjacent extraluminal gas are identified reference images 37-43 of series 2. Surgical consultation recommended. 2. Stable diffuse wall thickening of the distal small bowel and proximal colon consistent with enterocolitis. 3. Trace bilateral pleural effusions. 4. Hiatal hernia. 5.  Aortic Atherosclerosis (ICD10-I70.0).  Acute Abdomen in the setting of Perforated Viscus Recent Incarcerated Inguinal Hernia S/P Repair 6/14 --Repeat CT abdomen pelvis  6/24 shows with free air and fluid in her abdomen consistent with viscus perforation --S/p ex lap  for repair of Perforated Duodenal Ucer POD # 1 -Keep NPO  -cont IVF  -NG tube to intermittent suction -IV broad-spectrum as below -General surgery following --dr Hazle Quant --6/26-- upper G.I. series done today to evaluate for any leak. Await results in further surgery recommendation --6/27-- upper G.I. series negative for leak. Patient tolerating full liquid diet. Advanced soft. Had a bowel movement. Abdominal surgical drain removed per general surgery. --6/28-- overall improving. Patient intermittently requires IV morphine for abdominal pain. Encouraged her to sit out in the chair more. Will continue to see how she does over the weekend --6/29--some loose BM. Per stff not taking much solids yet. Prn lomotil. Change to po augmentin--d/w surgery --6/30-- patient improving with diarrhea. Able to tolerate more solid food. Walked with physical therapy around nurses station yesterday.  Sepsis without septic shock secondary to perforated viscus -Supplemental oxygen as needed, to maintain SpO2 > 90% -Lactic acid 4.0--5.7--1.1 -Monitor WBC/ fever curve -IV antibiotics: zosyn  -IVF hydration as needed -PRN Pressors for MAP goal >65 -- sepsis resolved   AKI on CKD Stage III   Hyponatremia Mild metabolic acidosis--resolved Hypokalemia - Ensure adequate renal perfusion - Avoid nephrotoxic agents as able - Replace electrolytes as indicated --bicarb gtt--now d/c --pharmacy to replace K    HLD  HTN -Atorvastatin 20mg  PO at bedtime once able to tolerate po -Resume BP meds as renal and BP permits --Prn hydralazine -- resume losartan  Slow improvement. Continues to have intermittent abdominal pain requiring IV pain meds. Will continue to monitor and see if patient remains stable with oral opioids over the weekend.  Procedures: Repair of perforated duodenal ulcer.                      Omental [name]) patch placement Family communication : daughter Kriste Basque on the phone Consults :gen surgery CODE STATUS: full DVT Prophylaxis :heparin Level of care: Med-Surg Status is: Inpatient Remains inpatient appropriate because: POD # 6 perforated viscous repair. Improving. Discharge next week    TOTAL TIME TAKING CARE OF THIS PATIENT: 35 minutes.  >50% time spent on counselling and coordination of care  Note: This dictation was prepared with Dragon dictation along with smaller phrase technology. Any transcriptional errors that result from this process are unintentional.  Enedina Finner M.D    [facility]   CC: Primary care physician; Barbette Reichmann, MD

## 2022-09-20 NOTE — Progress Notes (Signed)
Patient ID: Felicia Hughes, female   DOB: 1933-08-04, 89 y.o.   MRN: [id number]     SURGICAL PROGRESS NOTE   Hospital Day(s): 6.   Interval History: Patient seen and examined, no acute events or new complaints overnight. Patient reports feeling much better today after getting Imodium. Diarrhea under better control. Pain also controlled with Norco. No issues with diet.   Vital signs in last 24 hours: [min-max] current  Temp:  [98 F (36.7 C)-98.5 F (36.9 C)] 98 F (36.7 C) (06/30 0426) Pulse Rate:  [86-92] 86 (06/30 0426) Resp:  [16-18] 16 (06/30 0426) BP: (147-156)/(71-81) 147/71 (06/30 0426) SpO2:  [95 %-98 %] 95 % (06/30 0426) Weight:  [48.2 kg] 48.2 kg (06/30 0500)     Height: 5' (152.4 cm) Weight: 48.2 kg BMI (Calculated): 20.76   Physical Exam:  Constitutional: alert, cooperative and no distress  Respiratory: breathing non-labored at rest  Cardiovascular: regular rate and sinus rhythm  Gastrointestinal: soft, non-tender, and non-distended. Wound is dry and clean.   Labs:     Latest Ref Rng & Units 09/15/2022    5:13 AM 09/14/2022    7:54 AM 09/05/2022    5:30 AM  CBC  WBC 4.0 - 10.5 K/uL 14.3  27.4  16.5   Hemoglobin 12.0 - 15.0 g/dL 82.9  56.2  13.0   Hematocrit 36.0 - 46.0 % 37.6  41.8  32.8   Platelets 150 - 400 K/uL 442  530  320       Latest Ref Rng & Units 09/16/2022    4:24 AM 09/15/2022    5:13 AM 09/14/2022    8:56 PM  CMP  Glucose 70 - 99 mg/dL 865  784  696   BUN 8 - 23 mg/dL 35  29  29   Creatinine 0.44 - 1.00 mg/dL 2.95  2.84  1.32   Sodium 135 - 145 mmol/L 136  135  134   Potassium 3.5 - 5.1 mmol/L 3.6  4.4  4.2   Chloride 98 - 111 mmol/L 104  108  104   CO2 22 - 32 mmol/L 25  17  18    Calcium 8.9 - 10.3 mg/dL 8.2  8.0  8.8   Total Protein 6.5 - 8.1 g/dL 5.2  4.6    Total Bilirubin 0.3 - 1.2 mg/dL 0.6  0.7    Alkaline Phos 38 - 126 U/L 57  47    AST 15 - 41 U/L 34  27    ALT 0 - 44 U/L 21  19      Imaging studies: No new pertinent imaging  studies   Assessment/Plan:  89 y.o. female with peptic ulcer disease with perforation 6 Day Post-Op s/p duodenal ulcer repair.   -Patient doing much better today. More comfortable. Less anxious now that diarrhea improved.  -No fever or tachycardia.  -Wound is dry and clean.  -Abdominal pain controlled with Norco -Tolerating soft diet.  -Continue abx to complete 10 days -If continue adequate progress will consider discharge tomorrow.  -Encourage the patient to ambulate and do physical therapy   Gae Gallop, MD

## 2022-09-21 DIAGNOSIS — K5100 Ulcerative (chronic) pancolitis without complications: Secondary | ICD-10-CM | POA: Diagnosis not present

## 2022-09-21 LAB — BASIC METABOLIC PANEL
Anion gap: 12 (ref 5–15)
BUN: 16 mg/dL (ref 8–23)
CO2: 25 mmol/L (ref 22–32)
Calcium: 7.8 mg/dL — ABNORMAL LOW (ref 8.9–10.3)
Chloride: 103 mmol/L (ref 98–111)
Creatinine, Ser: 0.71 mg/dL (ref 0.44–1.00)
GFR, Estimated: >60 mL/min (ref >60)
Glucose, Bld: 98 mg/dL (ref 70–99)
Potassium: 3.5 mmol/L (ref 3.5–5.1)
Sodium: 140 mmol/L (ref 135–145)

## 2022-09-21 LAB — MAGNESIUM: Magnesium: 2.2 mg/dL (ref 1.7–2.4)

## 2022-09-21 MED ORDER — LOSARTAN POTASSIUM 50 MG PO TABS: 50.0000 mg | ORAL_TABLET | Freq: Every day | ORAL | 1 refills | Status: AC

## 2022-09-21 MED ORDER — HYDROCODONE-ACETAMINOPHEN 5-325 MG PO TABS: 1.0000 | ORAL_TABLET | Freq: Four times a day (QID) | ORAL | 0 refills | Status: AC | PRN

## 2022-09-21 MED ORDER — DOCUSATE SODIUM 100 MG PO CAPS: 100.0000 mg | ORAL_CAPSULE | Freq: Two times a day (BID) | ORAL | 0 refills | Status: DC | PRN

## 2022-09-21 MED ORDER — MELATONIN 5 MG PO TABS: 2.5000 mg | ORAL_TABLET | Freq: Every evening | ORAL | 0 refills | Status: AC | PRN

## 2022-09-21 MED ORDER — PANTOPRAZOLE SODIUM 40 MG PO TBEC: 40.0000 mg | DELAYED_RELEASE_TABLET | Freq: Two times a day (BID) | ORAL | 1 refills | Status: AC

## 2022-09-21 MED ORDER — AMOXICILLIN-POT CLAVULANATE 875-125 MG PO TABS: 1.0000 | ORAL_TABLET | Freq: Two times a day (BID) | ORAL | 0 refills | Status: AC

## 2022-09-21 NOTE — TOC Transition Note (Signed)
Transition of Care [facility]) - CM/SW Discharge Note   Patient Details  Name: Felicia Hughes MRN: [id number] Date of Birth: [birth year]  [facility] CM/SW Contact:  Chapman Fitch, RN Phone Number: 09/21/2022, 10:40 AM   Clinical Narrative:     Patient will DC to: [facility] Anticipated DC date: 09/21/22  Family notified: Daughter Ula Lingo by: to be transported by [employer] at [employer]  Per MD patient ready for DC to . RN, patient, patient's family, and facility notified of DC. Discharge Summary sent to facility. RN given number for report. DC packet on chart.  TOC signing off.     Barriers to Discharge: Continued Medical Work up   Patient Goals and CMS Choice CMS Medicare.gov Compare Post Acute Care list provided to:: Patient Choice offered to / list presented to : Patient  Discharge Placement                         Discharge Plan and Services Additional resources added to the After Visit Summary for                                       Social Determinants of Health (SDOH) Interventions SDOH Screenings   Food Insecurity: No Food Insecurity (09/14/2022)  Housing: Low Risk  (09/14/2022)  Transportation Needs: No Transportation Needs (09/14/2022)  Utilities: Not At Risk (09/14/2022)  Tobacco Use: Low Risk  (09/15/2022)     Readmission Risk Interventions    09/05/2022   11:39 AM  Readmission Risk Prevention Plan  Post Dischage Appt Complete  Medication Screening Complete  Transportation Screening Complete

## 2022-09-21 NOTE — Progress Notes (Signed)
Occupational Therapy Treatment Patient Details Name: Felicia Hughes MRN: [id number] DOB: 1933-08-04 Today's Date: 09/21/2022   History of present illness 89 y.o female with significant PMH of CKD, GERD, HLD, HTN, s/p cholecystectomy who presented to the ED with chief complaints of worsening abdominal pain. Pt had an open right incarcerated inguinal hernia repair on 6/14 and since discharge has had mild right-sided abdominal pain, worsening day of admission with nausea, vomiting, and diarrhea. Repeat imaging showed perforated viscus. PT admitted to hospital service with enterocolitis found with acute abdomen and pneumoperitoneum consistent with perforated viscus. Now s/p exploratory laparotomy and duodenal ulcer repair on 6/24.   OT comments  Upon entering the room, pt seated on [facility] with RN present in room and reports of trying to have BM. Pt able to perform hygiene with lateral leans on commode chair. Pt dons hygiene pad in disposable briefs and stands with min guard from commode to pull over B hips. Several steps taken to return to bed with min guard and without use of AD. Pt able to perform sit >supine with increased time but no physical assistance. Pt making progress towards goals. Call bell and all needed items within reach.    Recommendations for follow up therapy are one component of a multi-disciplinary discharge planning process, led by the attending physician.  Recommendations may be updated based on patient status, additional functional criteria and insurance authorization.    Assistance Recommended at Discharge Frequent or constant Supervision/Assistance  Patient can return home with the following  A little help with walking and/or transfers;Assistance with cooking/housework;Help with stairs or ramp for entrance;Assist for transportation;Direct supervision/assist for medications management;A little help with bathing/dressing/bathroom   Equipment Recommendations  BSC/3in1       Precautions  / Restrictions Precautions Precautions: Fall Precaution Comments: abdominal incisions Restrictions Weight Bearing Restrictions: No       Mobility Bed Mobility Overal bed mobility: Needs Assistance Bed Mobility: Sit to Supine       Sit to supine: Supervision   General bed mobility comments: increased time and effort with cuing for technique    Transfers Overall transfer level: Needs assistance Equipment used: Rolling walker (2 wheels) Transfers: Sit to/from Stand Sit to Stand: Min guard                 Balance Overall balance assessment: Needs assistance Sitting-balance support: Feet supported Sitting balance-Leahy Scale: Good     Standing balance support: Single extremity supported, During functional activity Standing balance-Leahy Scale: Fair                             ADL either performed or assessed with clinical judgement   ADL Overall ADL's : Needs assistance/impaired                         Toilet Transfer: Min guard;BSC/3in1   Toileting- Architect and Hygiene: Min guard;Sit to/from stand              Extremity/Trunk Assessment Upper Extremity Assessment Upper Extremity Assessment: Generalized weakness   Lower Extremity Assessment Lower Extremity Assessment: Generalized weakness        Vision Patient Visual Report: No change from baseline            Cognition Arousal/Alertness: Awake/alert Behavior During Therapy: WFL for tasks assessed/performed Overall Cognitive Status: Within Functional Limits for tasks assessed  Pertinent Vitals/ Pain       Pain Assessment Pain Assessment: 0-10 Pain Score: 4  Pain Location: abdomen Pain Descriptors / Indicators: Aching Pain Intervention(s): Limited activity within patient's tolerance, Monitored during session, Premedicated before session, Repositioned         Frequency  Min  2X/week        Progress Toward Goals  OT Goals(current goals can now be found in the care plan section)  Progress towards OT goals: Progressing toward goals     Plan Frequency remains appropriate;Discharge plan remains appropriate       AM-PAC OT "6 Clicks" Daily Activity     Outcome Measure   Help from another person eating meals?: None Help from another person taking care of personal grooming?: None Help from another person toileting, which includes using toliet, bedpan, or urinal?: A Little Help from another person bathing (including washing, rinsing, drying)?: A Little Help from another person to put on and taking off regular upper body clothing?: None Help from another person to put on and taking off regular lower body clothing?: A Little 6 Click Score: 21    End of Session    OT Visit Diagnosis: Other abnormalities of gait and mobility (R26.89);Pain   Activity Tolerance Patient tolerated treatment well   Patient Left in bed;with call bell/phone within reach;with bed alarm set   Nurse Communication Mobility status        Time: 0955-1010 OT Time Calculation (min): 15 min  Charges: OT General Charges $OT Visit: 1 Visit OT Treatments $Self Care/Home Management : 8-22 mins  Jackquline Denmark, MS, OTR/L , CBIS ascom [phone]  09/21/22, 12:19 PM

## 2022-09-21 NOTE — Plan of Care (Signed)

## 2022-09-21 NOTE — Discharge Summary (Signed)
Physician Discharge Summary   Patient: Felicia Hughes MRN: [id number] DOB: 1933/10/08  Admit date:     09/14/2022  Discharge date: 09/21/22  Discharge Physician: Enedina Finner   PCP: Barbette Reichmann, MD   Recommendations at discharge:   follow-up Dr. Hazle Quant per instructions follow-up PCP in 1 to 2 weeks  Discharge Diagnoses: perforated duodenal ulcer status post surgery   89 y.o female with significant PMH of CKD, GERD, HLD, HTN, s/p cholecystectomy who presented to the ED with chief complaints of worsening abdominal pain.  patient recently had an open right incarcerated inguinal hernia repair on 6/14 with Dr. Everlene Farrier.  Since discharge she has had very mild right-sided abdominal pain for a few days that worsened on the day of admission with associated nausea, vomiting and diarrhea.  CT abdomen with pelvis showing resolution of small bowel obstruction post inguinal hernia repair. Patient complained of worsening abdominal pain repeat CT abdomen pelvis obtained which showed perforated viscus.  General surgery consulted and patient taken urgently to the OR for repair.    6/24: CTA abdomen and pelvis IMPRESSION: 1. Interval development of diffuse pneumoperitoneum and increasing free fluid throughout the abdomen and pelvis, consistent with perforated viscus. Site of perforation may be within the gastric antrum/pylorus, where prominent wall thickening and adjacent extraluminal gas are identified reference images 37-43 of series 2. Surgical consultation recommended. 2. Stable diffuse wall thickening of the distal small bowel and proximal colon consistent with enterocolitis. 3. Trace bilateral pleural effusions. 4. Hiatal hernia. 5.  Aortic Atherosclerosis (ICD10-I70.0).   Acute Abdomen in the setting of Perforated Viscus Recent Incarcerated Inguinal Hernia S/P Repair 6/14 --Repeat CT abdomen pelvis  6/24 shows with free air and fluid in her abdomen consistent with viscus perforation -  6/25---S/p ex lap  for repair of Perforated Duodenal Ucer  -NG tube to intermittent suction -IV broad-spectrum as below -General surgery following --dr Hazle Quant --6/26-- upper G.I. series done today to evaluate for any leak. Await results in further surgery recommendation --6/27-- upper G.I. series negative for leak. Patient tolerating full liquid diet. Advanced soft. Had a bowel movement. Abdominal surgical drain removed per general surgery. --6/28-- overall improving. Patient intermittently requires IV morphine for abdominal pain. Encouraged her to sit out in the chair more. Will continue to see how she does over the weekend --6/29--some loose BM. Per stff not taking much solids yet. Prn lomotil. Change to po augmentin--d/w surgery --6/30-- patient improving with diarrhea. Able to tolerate more solid food. Walked with physical therapy around nurses station yesterday. --7/1-- patient overall doing well. Tolerating PO solid food. Potassium replaced. Discussed with patient's daughter Felicia Hughes on the phone. Okay from surgery standpoint for discharge to rehab today. Daughter in agreement.   Sepsis without septic shock secondary to perforated viscus -Lactic acid 4.0--5.7--1.1 -Monitor WBC/ fever curve -IV antibiotics: zosyn --change to po Augmentin -- sepsis resolved    AKI on CKD Stage III  Hyponatremia Mild metabolic acidosis--resolved Hypokalemia - Ensure adequate renal perfusion - Avoid nephrotoxic agents as able - Replace electrolytes as indicated --bicarb gtt--now d/c --pharmacy to replace K --K 3.5 today Mag 2.2    HLD  HTN -cont statins --Prn hydralazine -- resume losartan   Procedures: Repair of perforated duodenal ulcer.                      Omental [name]) patch placement Family communication : daughter Felicia Hughes on the phone Consults :gen surgery CODE STATUS: full DVT Prophylaxis :heparin  Overall appears to  be improving. Will discharge to rehab today. Charge plan  discussed with daughter Felicia Hughes on the phone and okay from Dr. Will Bonnet standpoint for discharge     Pain control - [facility] Controlled Substance Reporting System database was reviewed. and patient was instructed, not to drive, operate heavy machinery, perform activities at heights, swimming or participation in water activities or provide baby-sitting services while on Pain, Sleep and Anxiety Medications; until their outpatient Physician has advised to do so again. Also recommended to not to take more than prescribed Pain, Sleep and Anxiety Medications.   Diet recommendation:  Discharge Diet Orders (From admission, onward)     Start     Ordered   09/21/22 0000  Diet - low sodium heart healthy        09/21/22 0951            DISCHARGE MEDICATION: Allergies as of 09/21/2022       Reactions   Nitrofurantoin Hives   Sulfa Antibiotics Rash        Medication List     STOP taking these medications    zolpidem 10 MG tablet Commonly known as: AMBIEN       TAKE these medications    acetaminophen 325 MG tablet Commonly known as: Tylenol Take 2 tablets (650 mg total) by mouth every 8 (eight) hours as needed for mild pain.   amoxicillin-clavulanate 875-125 MG tablet Commonly known as: AUGMENTIN Take 1 tablet by mouth every 12 (twelve) hours for 5 doses.   atorvastatin 20 MG tablet Commonly known as: LIPITOR Take 20 mg by mouth daily at 6 PM.   Centrum Silver 50+Women Tabs Take by mouth.   cholecalciferol 1000 units tablet Commonly known as: VITAMIN D Take 1,000 Units by mouth daily.   docusate sodium 100 MG capsule Commonly known as: Colace Take 1 capsule (100 mg total) by mouth 2 (two) times daily as needed for up to 10 days for mild constipation.   Gemtesa 75 MG Tabs Generic drug: Vibegron Take 1 tablet (75 mg total) by mouth daily.   HYDROcodone-acetaminophen 5-325 MG tablet Commonly known as: NORCO/VICODIN Take 1 tablet by mouth every 6 (six)  hours as needed for moderate pain.   losartan 50 MG tablet Commonly known as: COZAAR Take 1 tablet (50 mg total) by mouth daily. Start taking on: September 22, 2022 What changed:  medication strength how much to take   melatonin 5 MG Tabs Take 0.5 tablets (2.5 mg total) by mouth at bedtime as needed.   pantoprazole 40 MG tablet Commonly known as: PROTONIX Take 1 tablet (40 mg total) by mouth 2 (two) times daily.        Follow-up Information     Carolan Shiver, MD Follow up in 1 week(s).   Specialty: General Surgery Why: Follow up after duodenal ulcer repair Contact information: [address] [place] [address] [phone]         Barbette Reichmann, MD. Schedule an appointment as soon as possible for a visit in 1 week(s).   Specialty: Internal Medicine Why: post-op and hopsital f/u Contact information: [address] [place] [address] [phone]                Discharge Exam: Ceasar Mons Weights   09/19/22 0355 09/20/22 0500 09/21/22 0342  Weight: 50.7 kg 48.2 kg 48 kg   GENERAL:  89 y.o.-year-old patient with no acute distress.  LUNGS: Normal breath sounds bilaterally, no wheezing CARDIOVASCULAR: S1, S2 normal.  No murmur   ABDOMEN: Soft, nontender, nondistended.surgical incision ok EXTREMITIES: No  edema b/l.    NEUROLOGIC: nonfocal  patient is alert and awake    Condition at discharge: fair  The results of significant diagnostics from this hospitalization (including imaging, microbiology, ancillary and laboratory) are listed below for reference.   Imaging Studies: DG UGI W SINGLE CM (SOL OR THIN BA)  Result Date: 09/16/2022 CLINICAL DATA:  89 year old female 2 days status post ex lap for perforated duodenal ulcer repair. Patient referred for water-soluble upper GI study to evaluate for leak. EXAM: DG UGI W WATER-SOLUBLE CONTRAST TECHNIQUE: Scout radiograph was obtained. Single contrast examination was  performed using 100 mL water-soluble contrast. This exam was performed by Alex Gardener, NP, and was supervised and interpreted by Elige Ko, MD. FLUOROSCOPY: Radiation Exposure Index (as provided by the fluoroscopic device): 43.10 mGy Kerma COMPARISON:  None Available. FINDINGS: Scout Radiograph: Nasogastric tube with the tip projecting over the stomach. No bowel dilatation to suggest obstruction. No evidence of pneumoperitoneum, portal venous gas or pneumatosis. No pathologic calcifications along the expected course of the ureters. No acute osseous abnormality. Surgical clips in the midline. 100 mL Omnipaque 300 was hand injected through a nasogastric tube. Stomach: Normal appearance. No hiatal hernia. Gastric emptying: Normal. Duodenum: Normal appearance. No extraluminal contrast to suggest a leak. Other:  None. IMPRESSION: No extraluminal contrast to suggest a duodenal leak. Electronically Signed   By: Elige Ko M.D.   On: 09/16/2022 15:26   CT ABDOMEN PELVIS WO CONTRAST  Result Date: 09/14/2022 CLINICAL DATA:  Postop right inguinal hernia repair, pain EXAM: CT ABDOMEN AND PELVIS WITHOUT CONTRAST TECHNIQUE: Multidetector CT imaging of the abdomen and pelvis was performed following the standard protocol without IV contrast. RADIATION DOSE REDUCTION: This exam was performed according to the departmental dose-optimization program which includes automated exposure control, adjustment of the mA and/or kV according to patient size and/or use of iterative reconstruction technique. COMPARISON:  09/14/2022 at 8:52 a.m. FINDINGS: Lower chest: Trace bilateral pleural effusions. Stable hiatal hernia. Hepatobiliary: Cholecystectomy. Unremarkable unenhanced appearance of the liver. Pancreas: Unremarkable unenhanced appearance. Spleen: Unremarkable unenhanced appearance. Adrenals/Urinary Tract: Residual contrast within the kidneys from earlier CT exam. No acute finding. The adrenals are unremarkable. Contrast fills the  bladder lumen, with no filling defect. Stomach/Bowel: In the interim since the prior study, diffuse pneumoperitoneum has developed consistent with perforated viscus. Extraluminal gas is seen adjacent to the gastric pylorus, and this could reflect the site of perforation. Surgical consultation is recommended. Wall thickening of the small and large bowel again identified consistent with enterocolitis. Wall thickening is most pronounced within the distal small bowel and proximal colon, as well as within the gastric antrum and proximal duodenum. No evidence of bowel obstruction or ileus. Vascular/Lymphatic: Aortic atherosclerosis. No enlarged abdominal or pelvic lymph nodes. Reproductive: Status post hysterectomy. No adnexal masses. Other: Diffuse pneumoperitoneum as described above, with increasing free fluid throughout the abdomen and pelvis consistent with perforated viscus. No abdominal wall hernia. Postsurgical changes from right inguinal hernia repair. Musculoskeletal: No acute or destructive bony abnormalities. Reconstructed images demonstrate no additional findings. IMPRESSION: 1. Interval development of diffuse pneumoperitoneum and increasing free fluid throughout the abdomen and pelvis, consistent with perforated viscus. Site of perforation may be within the gastric antrum/pylorus, where prominent wall thickening and adjacent extraluminal gas are identified reference images 37-43 of series 2. Surgical consultation recommended. 2. Stable diffuse wall thickening of the distal small bowel and proximal colon consistent with enterocolitis. 3. Trace  bilateral pleural effusions. 4. Hiatal hernia. 5.  Aortic Atherosclerosis (ICD10-I70.0). Critical Value/emergent results were called by telephone at the time of interpretation on 09/14/2022 at 7:38 pm to provider [facility] , who verbally acknowledged these results. Electronically Signed   By: Sharlet Salina M.D.   On: 09/14/2022 19:39   CT CHEST WO  CONTRAST  Result Date: 09/14/2022 CLINICAL DATA:  Abnormal xray - lung opacity/opacities EXAM: CT CHEST WITHOUT CONTRAST TECHNIQUE: Multidetector CT imaging of the chest was performed following the standard protocol without IV contrast. RADIATION DOSE REDUCTION: This exam was performed according to the departmental dose-optimization program which includes automated exposure control, adjustment of the mA and/or kV according to patient size and/or use of iterative reconstruction technique. COMPARISON:  CT abdomen pelvis 09/14/2022 8:59 a.m., CT abdomen pelvis 09/03/2022 FINDINGS: Cardiovascular: Normal heart size. No significant pericardial effusion. The thoracic aorta is normal in caliber. Moderate atherosclerotic plaque of the thoracic aorta. At least 3 vessel coronary artery calcifications. Aortic valve leaflet calcification. Likely mitral annular calcification. Mediastinum/Nodes: No enlarged mediastinal or axillary lymph nodes. Thyroid gland, trachea are unremarkable. Diffuse esophageal wall thickening. Small hiatal hernia. Lungs/Pleura: Biapical pleural/pulmonary scarring. No focal consolidation. No pulmonary nodule. No pulmonary mass. No pleural effusion. No pneumothorax. Upper Abdomen: Interval increase in size of at least small volume intraperitoneal gas. Interval increase in size of at least small to moderate volume, likely complex, intraperitoneal free fluid. Musculoskeletal: No chest wall abnormality. No suspicious lytic or blastic osseous lesions. No acute displaced fracture. IMPRESSION: 1. Interval increase in size of at least small volume intraperitoneal gas. Interval increase in size of at least small to moderate volume, likely complex, intraperitoneal free fluid. Findings suggestive of bowel perforation. Recommend surgical consultation. If follow-up CT abdomen pelvis repeated, please obtain with IV and PO contrast. 2. Diffuse esophageal wall thickening. Small hiatal hernia. Correlate with signs and  symptoms of esophagitis. Consider direct visualization. 3. Aortic Atherosclerosis (ICD10-I70.0) including coronary artery, mitral annular, aortic valvular calcification-correlate with aortic stenosis. These results will be called to the ordering clinician or representative by the Radiologist Assistant, and communication documented in the PACS or [employer]. Electronically Signed   By: Tish Frederickson M.D.   On: 09/14/2022 19:10   CT ABDOMEN PELVIS W CONTRAST  Result Date: 09/14/2022 CLINICAL DATA:  Severe postop abdominal pain. Recently postop from surgical repair of inguinal hernia and small bowel obstruction. EXAM: CT ABDOMEN AND PELVIS WITH CONTRAST TECHNIQUE: Multidetector CT imaging of the abdomen and pelvis was performed using the standard protocol following bolus administration of intravenous contrast. RADIATION DOSE REDUCTION: This exam was performed according to the departmental dose-optimization program which includes automated exposure control, adjustment of the mA and/or kV according to patient size and/or use of iterative reconstruction technique. CONTRAST:  75mL OMNIPAQUE IOHEXOL 300 MG/ML  SOLN COMPARISON:  09/03/2022 FINDINGS: Lower Chest: No acute findings. Hepatobiliary: No hepatic masses identified. Prior cholecystectomy. Diffuse biliary ductal dilatation is again seen. Pancreas:  No mass or inflammatory changes. Spleen: Within normal limits in size and appearance. Adrenals/Urinary Tract: No suspicious masses identified. No evidence of ureteral calculi or hydronephrosis. Stomach/Bowel: Tiny amount of postop free intraperitoneal air is noted. There has been surgical repair of right inguinal hernia and resolution of small bowel obstruction since prior study. Mild diffuse small bowel wall thickening is seen as well as wall thickening and mucosal enhancement of the ascending and transverse colon. This is consistent with enterocolitis. Small amount ascites noted, without evidence of abscess.  Vascular/Lymphatic: No pathologically  enlarged lymph nodes. No acute vascular findings. Reproductive: Prior hysterectomy noted. Adnexal regions are unremarkable in appearance. Other:  None. Musculoskeletal:  No suspicious bone lesions identified. IMPRESSION: Interval surgical repair of right inguinal hernia, with resolution of small bowel obstruction since prior study. Tiny amount of residual postop free air noted. Mild diffuse small bowel wall thickening and mucosal enhancement of ascending and transverse colon, consistent with enterocolitis. Small amount of ascites. No evidence of abscess. Stable diffuse biliary ductal dilatation, which may be due to prior cholecystectomy. Recommend correlation with liver function tests, and consider abdomen MRI and MRCP without and with contrast for further evaluation if clinically warranted. Electronically Signed   By: Danae Orleans M.D.   On: 09/14/2022 09:40   DG Chest 1 View  Result Date: 09/06/2022 CLINICAL DATA:  Cough EXAM: CHEST  1 VIEW COMPARISON:  None. FINDINGS: Hyperinflation of the lungs. Nonspecific 1.4 cm nodular opacity overlies the right upper lobe. Extensive bronchitic changes with probable bronchiectasis in the medial aspects of the right middle lobe and lingula. Cardiac and mediastinal contours are within normal limits. Mild atherosclerotic calcification in the transverse aorta. No acute osseous abnormality. IMPRESSION: 1. 1.4 cm right upper lobe pulmonary nodule may be infectious, inflammatory or neoplastic. Recommend further evaluation with CT scan of the chest. 2. Hyperinflation, bronchitic changes and probable bronchiectasis in the medial right middle lobe and lingula. Findings suggest the possibility of chronic indolent atypical infection such as MAI. Electronically Signed   By: Malachy Moan M.D.   On: 09/06/2022 09:59   CT ABDOMEN PELVIS W CONTRAST  Result Date: 09/03/2022 CLINICAL DATA:  Right-sided abdominal pain for a few hours. History of  hernia. EXAM: CT ABDOMEN AND PELVIS WITH CONTRAST TECHNIQUE: Multidetector CT imaging of the abdomen and pelvis was performed using the standard protocol following bolus administration of intravenous contrast. RADIATION DOSE REDUCTION: This exam was performed according to the departmental dose-optimization program which includes automated exposure control, adjustment of the mA and/or kV according to patient size and/or use of iterative reconstruction technique. CONTRAST:  OMNIPAQUE IOHEXOL 300 MG/ML  SOLN COMPARISON:  CT abdomen/pelvis 03/06/2019 FINDINGS: Lower chest: The lung bases are clear. The imaged heart is unremarkable. Hepatobiliary: The liver is unremarkable. The gallbladder is surgically absent there is mild intra and extrahepatic biliary ductal dilation with the common bile duct measuring up to 9 mm, new since 2020. Pancreas: There is mild dilation of the main pancreatic duct which is also new since 2020. No definite focal lesion is seen. There is no peripancreatic inflammatory change. Spleen: Unremarkable. Adrenals/Urinary Tract: The adrenals are unremarkable. There are areas of cortical thinning/scarring in both kidneys. Small hypodense lesions likely reflect benign cysts requiring no specific imaging follow-up. There are no suspicious renal lesions. There is symmetric excretion of contrast into the collecting systems on the delayed images. There is no hydronephrosis or hydroureter. There is no stone in either kidney or along the course of either ureter. The bladder is decompressed but grossly unremarkable. Stomach/Bowel: There is mild circumferential distal esophageal wall thickening. The stomach is distended. There is dilation of multiple small bowel loops measuring up to 3.0 cm in diameter. This extends to the level of the right inguinal hernia which contains a loop of small bowel. The small bowel distal to the hernia is decompressed, consistent with high-grade obstruction. There is no  pneumatosis intestinalis. There is colonic diverticulosis without evidence of acute diverticulitis. Vascular/Lymphatic: There is mild calcified plaque in the nonaneurysmal abdominal aorta. The major branch  vessels are patent. The main portal and splenic veins are patent. There is no portal venous gas. There is no abdominal or pelvic lymphadenopathy. Reproductive: The uterus is surgically absent. There is no adnexal mass. Other: There is small volume free fluid in the pelvis and around the liver. There is no free intraperitoneal air. As above, there is a right inguinal hernia containing a loop of small bowel as well as ascitic fluid. Musculoskeletal: There is no acute osseous abnormality or suspicious osseous lesion. There is grade 1 anterolisthesis of L4 on L5. IMPRESSION: 1. High-grade small-bowel obstruction due to a right inguinal hernia which contains a loop of small bowel and a small amount of fluid. No pneumatosis intestinalis or portal venous gas. 2. Small volume ascites in the abdomen and pelvis. 3. Mild intra and extrahepatic biliary ductal dilation and main pancreatic duct dilation, new since 2020. No definite focal lesion is seen; however, a small pancreatic head or ampullary neoplasm can not be excluded. Recommend nonemergent outpatient MRI of the abdomen with and without contrast for further evaluation when clinically appropriate 4. Mild circumferential distal esophageal wall thickening may reflect esophagitis. 5. Colonic diverticulosis without evidence of acute diverticulitis. These results were called by telephone at the time of interpretation on 09/03/2022 at 8:25 pm to provider [facility] , who verbally acknowledged these results. Electronically Signed   By: Lesia Hausen M.D.   On: 09/03/2022 20:26    Microbiology: Results for orders placed or performed during the hospital encounter of 09/14/22  MRSA Next Gen by PCR, Nasal     Status: None   Collection Time: 09/15/22 12:00 AM   Specimen:  Nasal Mucosa; Nasal Swab  Result Value Ref Range Status   MRSA by PCR Next Gen NOT DETECTED NOT DETECTED Final    Comment: (NOTE) The GeneXpert MRSA Assay (FDA approved for NASAL specimens only), is one component of a comprehensive MRSA colonization surveillance program. It is not intended to diagnose MRSA infection nor to guide or monitor treatment for MRSA infections. Test performance is not FDA approved in patients less than 88 years old. Performed at [facility], [address] [place], [place] [address]     Labs: CBC: Recent Labs  Lab 09/15/22 0513  WBC 14.3*  HGB 12.2  HCT 37.6  MCV 100.8*  PLT 442*   Basic Metabolic Panel: Recent Labs  Lab 09/14/22 1429 09/14/22 2056 09/15/22 0513 09/16/22 0424 09/20/22 1055 09/21/22 0436  NA  --  134* 135 136 137 140  K  --  4.2 4.4 3.6 2.2* 3.5  CL  --  104 108 104 102 103  CO2  --  18* 17* 25 25 25   GLUCOSE  --  150* 130* 113* 84 98  BUN  --  29* 29* 35* 15 16  CREATININE  --  1.24* 1.16* 1.00 0.69 0.71  CALCIUM  --  8.8* 8.0* 8.2* 7.9* 7.8*  MG 2.9*  --  2.6*  --  1.9 2.2   Liver Function Tests: Recent Labs  Lab 09/15/22 0513 09/16/22 0424  AST 27 34  ALT 19 21  ALKPHOS 47 57  BILITOT 0.7 0.6  PROT 4.6* 5.2*  ALBUMIN 2.4* 2.7*   CBG: Recent Labs  Lab 09/14/22 2357  GLUCAP 114*    Discharge time spent: greater than 30 minutes.  Signed: Enedina Finner, MD [facility] 09/21/2022

## 2022-09-21 NOTE — [place] FL2 (Signed)
[place] MEDICAID FL2 LEVEL OF CARE FORM     IDENTIFICATION  Patient Name: Felicia Hughes Birthdate: 1933-06-16 Sex: female Admission Date (Current Location): 09/14/2022  [facility] and [place] Number:  Chiropodist and Address:  [facility], [address], [place], [place] [address]      Provider Number: [id number]  Attending Physician Name and Address:  Enedina Finner, MD  Relative Name and Phone Number:  Lollie Sails  [phone]    Current Level of Care: Hospital Recommended Level of Care: Skilled Nursing Facility Prior Approval Number:    Date Approved/Denied:   PASRR Number: [id number]  Discharge Plan: SNF    Current Diagnoses: Patient Active Problem List   Diagnosis Date Noted   Enterocolitis 09/14/2022   Pancolitis (HCC) 09/14/2022   Incarcerated inguinal hernia, unilateral 09/04/2022   Small bowel obstruction (HCC) 09/03/2022   Glaucoma (increased eye pressure) 06/01/2018   Hyperlipidemia 06/01/2018   Hypertension 06/01/2018   Increased frequency of urination 04/30/2018   Overactive bladder 03/31/2016   Recurrent UTI 06/18/2015   Urge incontinence 05/22/2014    Orientation RESPIRATION BLADDER Height & Weight     Self, Situation, Place  O2 (6L nasal cannula) Continent Weight: 48 kg Height:  5' (152.4 cm)  BEHAVIORAL SYMPTOMS/MOOD NEUROLOGICAL BOWEL NUTRITION STATUS      Continent Diet (see discharge summary)  AMBULATORY STATUS COMMUNICATION OF NEEDS Skin   Limited Assist Verbally Other (Comment) (abdomen closed incision)                       Personal Care Assistance Level of Assistance  Bathing, Feeding, Dressing, Total care Bathing Assistance: Limited assistance Feeding assistance: Independent Dressing Assistance: Limited assistance Total Care Assistance: Limited assistance   Functional Limitations Info  Sight, Hearing, Speech Sight Info: Adequate Hearing Info: Impaired Speech Info: Adequate    SPECIAL CARE  FACTORS FREQUENCY  PT (By licensed PT), OT (By licensed OT)     PT Frequency: min 4x weekly OT Frequency: min 4x weekly            Contractures Contractures Info: Not present    Additional Factors Info  Code Status, Allergies Code Status Info: full Allergies Info: nitrofurantoin, sulfa antibiotics           Current Medications (09/21/2022):  This is the current hospital active medication list Current Facility-Administered Medications  Medication Dose Route Frequency Provider Last Rate Last Admin   acetaminophen (TYLENOL) tablet 650 mg  650 mg Oral Q6H PRN Carolan Shiver, MD   650 mg at 09/19/22 1100   Or   acetaminophen (TYLENOL) suppository 650 mg  650 mg Rectal Q6H PRN Carolan Shiver, MD       amoxicillin-clavulanate (AUGMENTIN) 875-125 MG per tablet 1 tablet  1 tablet Oral Q12H Enedina Finner, MD   1 tablet at 09/21/22 0945   atorvastatin (LIPITOR) tablet 20 mg  20 mg Oral q1800 Enedina Finner, MD   20 mg at 09/20/22 1629   cholecalciferol (VITAMIN D3) 25 MCG (1000 UNIT) tablet 1,000 Units  1,000 Units Oral Daily Enedina Finner, MD   1,000 Units at 09/21/22 0945   enoxaparin (LOVENOX) injection 40 mg  40 mg Subcutaneous Q24H Nazari, Walid A, RPH       hydrALAZINE (APRESOLINE) injection 10 mg  10 mg Intravenous Q6H PRN Carolan Shiver, MD       HYDROcodone-acetaminophen (NORCO/VICODIN) 5-325 MG per tablet 1 tablet  1 tablet Oral Q4H PRN Carolan Shiver, MD   1  tablet at 09/21/22 0945   loperamide (IMODIUM) capsule 2 mg  2 mg Oral PRN Enedina Finner, MD   2 mg at 09/21/22 0945   losartan (COZAAR) tablet 50 mg  50 mg Oral Daily Enedina Finner, MD   50 mg at 09/21/22 0945   melatonin tablet 2.5 mg  2.5 mg Oral QHS PRN Mansy, Jan A, MD   2.5 mg at 09/20/22 2242   mirabegron ER (MYRBETRIQ) tablet 25 mg  25 mg Oral Daily Enedina Finner, MD   25 mg at 09/21/22 0945   morphine (PF) 2 MG/ML injection 1-2 mg  1-2 mg Intravenous Q6H PRN Enedina Finner, MD       multivitamin with  minerals tablet 1 tablet  1 tablet Oral Daily Enedina Finner, MD   1 tablet at 09/21/22 0945   ondansetron (ZOFRAN) tablet 4 mg  4 mg Oral Q6H PRN Carolan Shiver, MD   4 mg at 09/17/22 1218   Or   ondansetron (ZOFRAN) injection 4 mg  4 mg Intravenous Q6H PRN Carolan Shiver, MD   4 mg at 09/16/22 1507   Oral care mouth rinse  15 mL Mouth Rinse PRN Enedina Finner, MD       pantoprazole (PROTONIX) EC tablet 40 mg  40 mg Oral BID Enedina Finner, MD   40 mg at 09/21/22 0945   potassium chloride SA (KLOR-CON M) CR tablet 40 mEq  40 mEq Oral TID Enedina Finner, MD   40 mEq at 09/21/22 0945     Discharge Medications: Please see discharge summary for a list of discharge medications.  Relevant Imaging Results:  Relevant Lab Results:   Additional Information SSN: [id number]  Chapman Fitch, RN

## 2022-09-21 NOTE — Care Management Important Message (Signed)
Important Message  Patient Details  Name: Felicia Hughes MRN: [id number] Date of Birth: 1933-03-31   Medicare Important Message Given:  Yes     Johnell Comings 09/21/2022, 10:55 AM

## 2022-09-21 NOTE — Progress Notes (Signed)
Patient ID: Felicia Hughes, female   DOB: January 13, 1934, 89 y.o.   MRN: [id number]     SURGICAL PROGRESS NOTE   Hospital Day(s): 7.   Interval History: Patient seen and examined, no acute events or new complaints overnight. Patient reports feeling better this morning.  She endorses that the diarrhea has improved.  Pain is controlled.  Tolerating diet.  No complaints overnight.  She wishes to go home today.  Vital signs in last 24 hours: [min-max] current  Temp:  [98.3 F (36.8 C)-99 F (37.2 C)] 98.7 F (37.1 C) (07/01 0748) Pulse Rate:  [87-92] 87 (07/01 0748) Resp:  [16-20] 16 (07/01 0748) BP: (141-156)/(69-76) 141/75 (07/01 0748) SpO2:  [93 %-96 %] 95 % (07/01 0748) Weight:  [48 kg] 48 kg (07/01 0342)     Height: 5' (152.4 cm) Weight: 48 kg BMI (Calculated): 20.67   Physical Exam:  Constitutional: alert, cooperative and no distress  Respiratory: breathing non-labored at rest  Cardiovascular: regular rate and sinus rhythm  Gastrointestinal: soft, non-tender, and non-distended.  The wound is dry and clean  Labs:     Latest Ref Rng & Units 09/15/2022    5:13 AM 09/14/2022    7:54 AM 09/05/2022    5:30 AM  CBC  WBC 4.0 - 10.5 K/uL 14.3  27.4  16.5   Hemoglobin 12.0 - 15.0 g/dL 81.1  91.4  78.2   Hematocrit 36.0 - 46.0 % 37.6  41.8  32.8   Platelets 150 - 400 K/uL 442  530  320       Latest Ref Rng & Units 09/21/2022    4:36 AM 09/20/2022   10:55 AM 09/16/2022    4:24 AM  CMP  Glucose 70 - 99 mg/dL 98  84  956   BUN 8 - 23 mg/dL 16  15  35   Creatinine 0.44 - 1.00 mg/dL 2.13  0.86  5.78   Sodium 135 - 145 mmol/L 140  137  136   Potassium 3.5 - 5.1 mmol/L 3.5  2.2  3.6   Chloride 98 - 111 mmol/L 103  102  104   CO2 22 - 32 mmol/L 25  25  25    Calcium 8.9 - 10.3 mg/dL 7.8  7.9  8.2   Total Protein 6.5 - 8.1 g/dL   5.2   Total Bilirubin 0.3 - 1.2 mg/dL   0.6   Alkaline Phos 38 - 126 U/L   57   AST 15 - 41 U/L   34   ALT 0 - 44 U/L   21     Imaging studies: No new pertinent  imaging studies   Assessment/Plan:  89 y.o. female with peptic ulcer disease with perforation 7 Day Post-Op s/p duodenal ulcer repair.   Patient continue adequate progress.  Pain is better.  Diarrhea is better.  Tolerating diet. -Patient can be discharged from surgical standpoint.  I will follow her in a week in my office. -Patient can shower -Continue current diet -Continue Protonix daily -Complete antibiotic therapy for 10 days -No contraindication for physical and compression therapy  Gae Gallop, MD

## 2022-09-21 NOTE — Discharge Instructions (Signed)
  Diet: Resume home heart healthy regular diet.   Activity: No heavy lifting >20 pounds (children, pets, laundry, garbage) or strenuous activity until follow-up, but light activity and walking are encouraged. Do not drive or drink alcohol if taking narcotic pain medications.  Wound care: May shower with soapy water and pat dry (do not rub incisions), but no baths or submerging incision underwater until follow-up. (no swimming)   Medications: Resume all home medications. For mild to moderate pain: acetaminophen (Tylenol). Combining Tylenol with alcohol can substantially increase your risk of causing liver disease. Narcotic pain medications, if prescribed, can be used for severe pain, though may cause nausea, constipation, and drowsiness. Do not combine Tylenol and Norco within a 6 hour period as Norco contains Tylenol. If you do not need the narcotic pain medication, you do not need to fill the prescription.  Avoid NSAIDS  Call office [phone]) at any time if any questions, worsening pain, fevers/chills, bleeding, drainage from incision site, or other concerns.

## 2022-09-30 ENCOUNTER — Encounter (HOSPITAL_COMMUNITY): Payer: Self-pay

## 2022-09-30 ENCOUNTER — Inpatient Hospital Stay
Admission: AD | Admit: 2022-09-30 | Discharge: 2022-10-07 | DRG: 862 | Disposition: A | Discharge status: Skilled Nursing Facility | Payer: Medicare Other | Source: Skilled Nursing Facility | Attending: General Surgery | Admitting: General Surgery

## 2022-09-30 ENCOUNTER — Ambulatory Visit
Admission: RE | Admit: 2022-09-30 | Discharge: 2022-09-30 | Disposition: A | Discharge status: Home / Self Care | Payer: Medicare Other | Source: Ambulatory Visit | Attending: General Surgery | Admitting: General Surgery

## 2022-09-30 ENCOUNTER — Other Ambulatory Visit: Payer: Self-pay | Admitting: General Surgery

## 2022-09-30 DIAGNOSIS — K265 Chronic or unspecified duodenal ulcer with perforation: Secondary | ICD-10-CM

## 2022-09-30 DIAGNOSIS — Z8742 Personal history of other diseases of the female genital tract: Secondary | ICD-10-CM

## 2022-09-30 DIAGNOSIS — R197 Diarrhea, unspecified: Secondary | ICD-10-CM | POA: Diagnosis present

## 2022-09-30 DIAGNOSIS — I10 Essential (primary) hypertension: Secondary | ICD-10-CM | POA: Diagnosis present

## 2022-09-30 DIAGNOSIS — Z8711 Personal history of peptic ulcer disease: Secondary | ICD-10-CM

## 2022-09-30 DIAGNOSIS — R1084 Generalized abdominal pain: Secondary | ICD-10-CM | POA: Insufficient documentation

## 2022-09-30 DIAGNOSIS — Z90710 Acquired absence of both cervix and uterus: Secondary | ICD-10-CM

## 2022-09-30 DIAGNOSIS — Z515 Encounter for palliative care: Secondary | ICD-10-CM

## 2022-09-30 DIAGNOSIS — Z803 Family history of malignant neoplasm of breast: Secondary | ICD-10-CM

## 2022-09-30 DIAGNOSIS — T8143XA Infection following a procedure, organ and space surgical site, initial encounter: Principal | ICD-10-CM | POA: Diagnosis present

## 2022-09-30 DIAGNOSIS — K219 Gastro-esophageal reflux disease without esophagitis: Secondary | ICD-10-CM | POA: Diagnosis present

## 2022-09-30 DIAGNOSIS — E7800 Pure hypercholesterolemia, unspecified: Secondary | ICD-10-CM | POA: Diagnosis present

## 2022-09-30 DIAGNOSIS — Z87440 Personal history of urinary (tract) infections: Secondary | ICD-10-CM

## 2022-09-30 DIAGNOSIS — Z882 Allergy status to sulfonamides status: Secondary | ICD-10-CM

## 2022-09-30 DIAGNOSIS — Z87448 Personal history of other diseases of urinary system: Secondary | ICD-10-CM

## 2022-09-30 DIAGNOSIS — K651 Peritoneal abscess: Secondary | ICD-10-CM | POA: Diagnosis present

## 2022-09-30 DIAGNOSIS — Z79899 Other long term (current) drug therapy: Secondary | ICD-10-CM

## 2022-09-30 MED ORDER — SODIUM CHLORIDE 0.9% FLUSH
10.0000 mL | Freq: Two times a day (BID) | INTRAVENOUS | Status: DC
  Administered 2022-10-01 – 2022-10-07 (×11): 10 mL

## 2022-09-30 MED ORDER — ONDANSETRON 4 MG PO TBDP: 4.0000 mg | ORAL_TABLET | Freq: Four times a day (QID) | ORAL | Status: DC | PRN

## 2022-09-30 MED ORDER — ACETAMINOPHEN 325 MG PO TABS: 650.0000 mg | ORAL_TABLET | Freq: Four times a day (QID) | ORAL | Status: DC | PRN

## 2022-09-30 MED ORDER — SODIUM CHLORIDE 0.9 % IV SOLN: INTRAVENOUS | Status: DC

## 2022-09-30 MED ORDER — ACETAMINOPHEN 650 MG RE SUPP: 650.0000 mg | Freq: Four times a day (QID) | RECTAL | Status: DC | PRN

## 2022-09-30 MED ORDER — HYDROCODONE-ACETAMINOPHEN 5-325 MG PO TABS
1.0000 | ORAL_TABLET | ORAL | Status: DC | PRN
  Administered 2022-09-30 (×2): 1 via ORAL
  Administered 2022-10-01 – 2022-10-02 (×4): 2 via ORAL
  Administered 2022-10-02 (×2): 1 via ORAL
  Administered 2022-10-03 (×3): 2 via ORAL
  Administered 2022-10-04 (×2): 1 via ORAL
  Administered 2022-10-04: 2 via ORAL
  Filled 2022-09-30: qty 2
  Filled 2022-09-30: qty 1
  Filled 2022-09-30: qty 2
  Filled 2022-09-30: qty 1
  Filled 2022-09-30 (×2): qty 2
  Filled 2022-09-30 (×3): qty 1
  Filled 2022-09-30 (×2): qty 2
  Filled 2022-09-30: qty 1
  Filled 2022-09-30: qty 2
  Filled 2022-09-30: qty 1
  Filled 2022-09-30: qty 2

## 2022-09-30 MED ORDER — MIRABEGRON ER 25 MG PO TB24
25.0000 mg | ORAL_TABLET | Freq: Every day | ORAL | Status: DC
  Administered 2022-09-30 – 2022-10-07 (×8): 25 mg via ORAL
  Filled 2022-09-30 (×10): qty 1

## 2022-09-30 MED ORDER — IOHEXOL 300 MG/ML  SOLN
80.0000 mL | Freq: Once | INTRAMUSCULAR | Status: AC | PRN
  Administered 2022-09-30: 80 mL via INTRAVENOUS

## 2022-09-30 MED ORDER — MELATONIN 5 MG PO TABS
2.5000 mg | ORAL_TABLET | Freq: Every evening | ORAL | Status: DC | PRN
  Administered 2022-09-30 – 2022-10-06 (×5): 2.5 mg via ORAL
  Filled 2022-09-30 (×5): qty 1

## 2022-09-30 MED ORDER — PANTOPRAZOLE SODIUM 40 MG PO TBEC
40.0000 mg | DELAYED_RELEASE_TABLET | Freq: Two times a day (BID) | ORAL | Status: DC
  Administered 2022-09-30 – 2022-10-07 (×14): 40 mg via ORAL
  Filled 2022-09-30 (×14): qty 1

## 2022-09-30 MED ORDER — ONDANSETRON HCL 4 MG/2ML IJ SOLN: 4.0000 mg | Freq: Four times a day (QID) | INTRAMUSCULAR | Status: DC | PRN

## 2022-09-30 MED ORDER — DOCUSATE SODIUM 100 MG PO CAPS: 100.0000 mg | ORAL_CAPSULE | Freq: Two times a day (BID) | ORAL | Status: DC | PRN

## 2022-09-30 MED ORDER — PIPERACILLIN-TAZOBACTAM 3.375 G IVPB
3.3750 g | Freq: Three times a day (TID) | INTRAVENOUS | Status: DC
  Administered 2022-09-30 – 2022-10-07 (×21): 3.375 g via INTRAVENOUS
  Filled 2022-09-30 (×21): qty 50

## 2022-09-30 MED ORDER — SODIUM CHLORIDE 0.9% FLUSH: 10.0000 mL | INTRAVENOUS | Status: DC | PRN

## 2022-09-30 MED ORDER — LOSARTAN POTASSIUM 50 MG PO TABS
50.0000 mg | ORAL_TABLET | Freq: Every day | ORAL | Status: DC
  Administered 2022-10-01 – 2022-10-07 (×7): 50 mg via ORAL
  Filled 2022-09-30 (×7): qty 1

## 2022-09-30 MED ORDER — SUCRALFATE 1 GM/10ML PO SUSP
1.0000 g | Freq: Three times a day (TID) | ORAL | Status: DC
  Administered 2022-09-30 – 2022-10-07 (×20): 1 g via ORAL
  Filled 2022-09-30 (×23): qty 10

## 2022-09-30 MED ORDER — MORPHINE SULFATE (PF) 2 MG/ML IV SOLN
2.0000 mg | INTRAVENOUS | Status: DC | PRN
  Administered 2022-10-01 – 2022-10-06 (×7): 2 mg via INTRAVENOUS
  Filled 2022-09-30 (×8): qty 1

## 2022-09-30 NOTE — H&P (Signed)
HISTORY OF PRESENT ILLNESS:    [place] Nazar presents for a due to intra-abdominal abscess.  Patient recently admitted with duodenal ulcer perforation.  She underwent exploratory laparotomy with repair of duodenal ulcer and Graham patch.  She recovered well.  Upper GI done prior to discharge negative for leak.  She was discharged to skilled nursing facility for rehab.   Patient endorses that she has been feeling bad.  She endorses generalized abdominal pain.  She endorses decreased appetite.  She has also been depressed.  No nausea or vomiting.  Passing gas and having bowel movement.  She is on Protonix twice daily.  She has C. difficile done at the skilled nursing facility that was negative.  CT scan of the abdomen and pelvis today shows large intra-abdominal abscess in the right upper quadrant.  She has leukocytosis of 13,000.     PAST MEDICAL HISTORY:  Past Medical History      Past Medical History:  Diagnosis Date   Dyspareunia      vaginal dryness   Endometriosis of uterus     Fibroid     Glaucoma (increased eye pressure)     Hyperlipidemia     Hypertension            PAST SURGICAL HISTORY:   Past Surgical History       Past Surgical History:  Procedure Laterality Date   EXPLORATORY LAPAROTOMY   09/14/2022    Dr Arrie Senate   CHOLECYSTECTOMY       HYSTERECTOMY       OOPHORECTOMY Bilateral             MEDICATIONS:  Encounter Medications        Outpatient Encounter Medications as of 09/29/2022  Medication Sig Dispense Refill   amoxicillin-clavulanate (AUGMENTIN) 875-125 mg tablet         atorvastatin (LIPITOR) 20 MG tablet TAKE 1 TABLET BY MOUTH EVERY DAY IN THE EVENING FOR CHOLESTEROL 90 tablet 1   cholecalciferol, vitamin D3, 5,000 unit Tab Take by mouth once daily         docusate (COLACE) 100 MG capsule Take 100 mg by mouth 2 (two) times daily       HYDROcodone-acetaminophen (NORCO) 5-325 mg tablet Take 1 tablet by mouth every 6 (six) hours as needed for  Pain for up to 15 days 20 tablet 0   losartan (COZAAR) 25 MG tablet Take 1 tablet (25 mg total) by mouth once daily (Patient taking differently: Take 50 mg by mouth once daily) 90 tablet 1   melatonin-pyridoxine, vit B6, (MELATONIN, WITH B6,) 5-1 mg Tab Take by mouth as needed       multivitamin tablet Take 1 tablet by mouth once daily       pantoprazole (PROTONIX) 40 MG DR tablet Take 1 tablet by mouth 2 (two) times daily       vibegron (GEMTESA) 75 mg Tab Take 75 mg by mouth once daily       aspirin 81 MG EC tablet Take 1 tablet by mouth once daily (Patient not taking: Reported on 09/29/2022)       CALCIUM CITRATE/VITAMIN D3 (CITRACAL REGULAR ORAL) Take by mouth once daily (Patient not taking: Reported on 09/29/2022)       cyanocobalamin (VITAMIN B12) 1000 MCG tablet Take 1 tablet (1,000 mcg total) by mouth once daily (Patient not taking: Reported on 09/29/2022) 90 tablet 1   estradiol (ESTRACE) 0.01 % (0.1 mg/gram) vaginal cream  (Patient not taking: Reported on 09/08/2022)  12   FOLIC ACID/MULTIVIT-MIN/LUTEIN (CENTRUM SILVER ORAL) Take by mouth once daily (Patient not taking: Reported on 05/28/2022)       food supplemt, lactose-reduced (ENSURE PLUS HIGH PROTEIN) 0.08 gram-1.5 kcal/mL Liqd Take 1 Bottle by mouth 2 (two) times daily (Patient not taking: Reported on 09/29/2022) 60 mL 2   LACTOBACILLUS ACIDOPHILUS (PROBIOTIC ORAL) Take by mouth once daily   (Patient not taking: Reported on 09/29/2022)       mirtazapine (REMERON) 7.5 MG tablet Take 1 tablet (7.5 mg total) by mouth at bedtime for 180 days (Patient not taking: Reported on 11/10/2021) 90 tablet 1   oxybutynin (DITROPAN) 5 mg tablet Take 5 mg by mouth 3 (three) times daily as needed   (Patient not taking: Reported on 09/29/2022)       sucralfate (CARAFATE) 1 gram tablet Take 1 tablet (1 g total) by mouth 4 (four) times daily before meals and nightly 120 tablet 11   zolpidem (AMBIEN) 10 mg tablet Take 1 tablet (10 mg total) by mouth at bedtime (Patient  not taking: Reported on 09/29/2022) 30 tablet 1    No facility-administered encounter medications on file as of 09/29/2022.        ALLERGIES:   Nitrofurantoin and Sulfasalazine     PHYSICAL EXAM:     Vitals:    09/29/22 1619  BP: (!) 155/78  Pulse: 99  .  Ht:152.4 cm (5') Wt:(!) 45 kg (99 lb 3.2 oz) ZOX:WRUE surface area is 1.38 meters squared. Body mass index is 19.37 kg/m.Marland Kitchen   GENERAL: Alert, oriented x3   ABDOMEN: Soft and depressible, nontender with no palpable mass, no hepatomegaly.  Surgical incision is dry and clean, well-healed.      IMPRESSION:     Duodenal ulcer with perforation (CMS/HHS-HCC) [K26.5] -S/p duodenal ulcer perforation repair with [name] patch on 09/14/2022 -She has had a rough couple of months with incarcerated inguinal hernia s/p repair on 09/04/2022. -Recovering slowly but adequately from the duodenal ulcer perforation and he was discharged to skilled nursing facility -No significant clinical deterioration but patient still with significant pain and uncomfortable.  Having diarrhea.  Been able to stay hydrated. -I repeat some labs today showing mild leukocytosis of 13,000, elevated platelets.  Pending CMP for evaluation of electrolytes and kidney function. -CT scan of the abdomen and pelvis was showed a right intra-abdominal abscess.  No free air or free fluid. -I discussed case with interventional radiologist Dr. Juliette Alcide who agreed that patient is a candidate for percutaneous drainage.  Will keep her n.p.o. after midnight for possible procedure tomorrow.           PLAN:  1.  Start antibiotic therapy for possible intra-abdominal abscess 2.  Possible percutaneous drainage tomorrow 3.  Pain management   Patient, and her daughter (contacted by phone) verbalized understanding, all questions were answered, and were agreeable with the plan outlined above.    Carolan Shiver, MD   Electronically signed by Carolan Shiver, MD

## 2022-10-01 ENCOUNTER — Encounter: Payer: Self-pay | Admitting: General Surgery

## 2022-10-01 ENCOUNTER — Observation Stay: Payer: Medicare Other

## 2022-10-01 DIAGNOSIS — Z803 Family history of malignant neoplasm of breast: Secondary | ICD-10-CM | POA: Diagnosis not present

## 2022-10-01 DIAGNOSIS — Z515 Encounter for palliative care: Secondary | ICD-10-CM | POA: Diagnosis not present

## 2022-10-01 DIAGNOSIS — R197 Diarrhea, unspecified: Secondary | ICD-10-CM | POA: Diagnosis present

## 2022-10-01 DIAGNOSIS — T8143XA Infection following a procedure, organ and space surgical site, initial encounter: Secondary | ICD-10-CM | POA: Diagnosis present

## 2022-10-01 DIAGNOSIS — Z8711 Personal history of peptic ulcer disease: Secondary | ICD-10-CM | POA: Diagnosis not present

## 2022-10-01 DIAGNOSIS — Z87448 Personal history of other diseases of urinary system: Secondary | ICD-10-CM | POA: Diagnosis not present

## 2022-10-01 DIAGNOSIS — Z90710 Acquired absence of both cervix and uterus: Secondary | ICD-10-CM | POA: Diagnosis not present

## 2022-10-01 DIAGNOSIS — Z882 Allergy status to sulfonamides status: Secondary | ICD-10-CM | POA: Diagnosis not present

## 2022-10-01 DIAGNOSIS — Z8742 Personal history of other diseases of the female genital tract: Secondary | ICD-10-CM | POA: Diagnosis not present

## 2022-10-01 DIAGNOSIS — Z87440 Personal history of urinary (tract) infections: Secondary | ICD-10-CM | POA: Diagnosis not present

## 2022-10-01 DIAGNOSIS — Z79899 Other long term (current) drug therapy: Secondary | ICD-10-CM | POA: Diagnosis not present

## 2022-10-01 DIAGNOSIS — I10 Essential (primary) hypertension: Secondary | ICD-10-CM | POA: Diagnosis present

## 2022-10-01 DIAGNOSIS — K651 Peritoneal abscess: Secondary | ICD-10-CM | POA: Diagnosis present

## 2022-10-01 DIAGNOSIS — Z7189 Other specified counseling: Secondary | ICD-10-CM | POA: Diagnosis not present

## 2022-10-01 DIAGNOSIS — E7800 Pure hypercholesterolemia, unspecified: Secondary | ICD-10-CM | POA: Diagnosis present

## 2022-10-01 DIAGNOSIS — K219 Gastro-esophageal reflux disease without esophagitis: Secondary | ICD-10-CM | POA: Diagnosis present

## 2022-10-01 LAB — PROTIME-INR
INR: 1.1 (ref 0.8–1.2)
Prothrombin Time: 14.1 seconds (ref 11.4–15.2)

## 2022-10-01 MED ORDER — SODIUM CHLORIDE 0.9% FLUSH
5.0000 mL | Freq: Three times a day (TID) | INTRAVENOUS | Status: DC
  Administered 2022-10-01 – 2022-10-07 (×16): 5 mL

## 2022-10-01 MED ORDER — FENTANYL CITRATE (PF) 100 MCG/2ML IJ SOLN
INTRAMUSCULAR | Status: AC
  Filled 2022-10-01: qty 4

## 2022-10-01 MED ORDER — MIDAZOLAM HCL 2 MG/2ML IJ SOLN
INTRAMUSCULAR | Status: AC | PRN
  Administered 2022-10-01: 1 mg via INTRAVENOUS

## 2022-10-01 MED ORDER — LIDOCAINE HCL (PF) 1 % IJ SOLN
10.0000 mL | Freq: Once | INTRAMUSCULAR | Status: AC
  Administered 2022-10-01: 10 mL
  Filled 2022-10-01: qty 10

## 2022-10-01 MED ORDER — FENTANYL CITRATE (PF) 100 MCG/2ML IJ SOLN
INTRAMUSCULAR | Status: AC | PRN
  Administered 2022-10-01: 50 ug via INTRAVENOUS

## 2022-10-01 MED ORDER — MORPHINE SULFATE (PF) 2 MG/ML IV SOLN
INTRAVENOUS | Status: AC
  Administered 2022-10-02: 2 mg via INTRAVENOUS
  Filled 2022-10-01: qty 1

## 2022-10-01 MED ORDER — MIDAZOLAM HCL 2 MG/2ML IJ SOLN
INTRAMUSCULAR | Status: AC
  Filled 2022-10-01: qty 4

## 2022-10-01 NOTE — Progress Notes (Signed)
PT Cancellation Note  Patient Details Name: [place] Taft MRN: [id number] DOB: 08-31-1933   Cancelled Treatment:    Reason Eval/Treat Not Completed: Patient at procedure or test/unavailable Attempted to see pt just after lunch and now again ~15:15 and pt remains out of room for procedure.  Will maintain on caseload and attempt to see when pt available at a later date.  Malachi Pro, DPT 10/01/2022, 3:20 PM

## 2022-10-01 NOTE — TOC Initial Note (Signed)
Transition of Care [facility]) - Initial/Assessment Note    Patient Details  Name: Felicia Hughes MRN: [id number] Date of Birth: 1933-11-08  Transition of [facility]) CM/SW Contact:    Chapman Fitch, RN Phone Number: 10/01/2022, 2:40 PM  Clinical Narrative:                  Admitted for: intra-abdominal abscess Admitted from: Baseline patient lives at [facility] independent living.  Prior to admission patient was from the skilled side receiving rehab PCP: Hande  Patient confirms plan to return at discharge Therapy Evals pending Will need FL2 when level of care determined    Expected Discharge Plan: Skilled Nursing Facility     Patient Goals and CMS Choice            Expected Discharge Plan and Services                                              Prior Living Arrangements/Services                       Activities of Daily Living      Permission Sought/Granted                  Emotional Assessment              Admission diagnosis:  Intra-abdominal abscess [facility]) [K65.1] Patient Active Problem List   Diagnosis Date Noted   Intra-abdominal abscess (HCC) 09/30/2022   Enterocolitis 09/14/2022   Pancolitis (HCC) 09/14/2022   Incarcerated inguinal hernia, unilateral 09/04/2022   Small bowel obstruction (HCC) 09/03/2022   Glaucoma (increased eye pressure) 06/01/2018   Hyperlipidemia 06/01/2018   Hypertension 06/01/2018   Increased frequency of urination 04/30/2018   Overactive bladder 03/31/2016   Recurrent UTI 06/18/2015   Urge incontinence 05/22/2014   PCP:  Barbette Reichmann, MD Pharmacy:   [facility] [phone] [place], [place] - [address] [place] [address] Phone: [phone] Fax: [phone]     Social Determinants of Health (SDOH) Social History: SDOH Screenings   Food Insecurity: No Food Insecurity (09/14/2022)  Housing: Low Risk  (09/14/2022)  Transportation Needs: No  Transportation Needs (09/14/2022)  Utilities: Not At Risk (09/14/2022)  Financial Resource Strain: Low Risk  (06/29/2022)   Received from [facility], [facility]  Tobacco Use: Low Risk  (10/01/2022)   SDOH Interventions:     Readmission Risk Interventions    09/05/2022   11:39 AM  Readmission Risk Prevention Plan  Post Dischage Appt Complete  Medication Screening Complete  Transportation Screening Complete

## 2022-10-01 NOTE — Plan of Care (Signed)
  Problem: Education: Goal: Knowledge of General Education information will improve Description: Including pain rating scale, medication(s)/side effects and non-pharmacologic comfort measures Outcome: Progressing   Problem: Clinical Measurements: Goal: Ability to maintain clinical measurements within normal limits will improve Outcome: Progressing Goal: Respiratory complications will improve Outcome: Progressing Goal: Cardiovascular complication will be avoided Outcome: Progressing   Problem: Activity: Goal: Risk for activity intolerance will decrease Outcome: Progressing   Problem: Coping: Goal: Level of anxiety will decrease Outcome: Progressing   Problem: Elimination: Goal: Will not experience complications related to bowel motility Outcome: Progressing Goal: Will not experience complications related to urinary retention Outcome: Progressing   Problem: Safety: Goal: Ability to remain free from injury will improve Outcome: Progressing   

## 2022-10-01 NOTE — Procedures (Signed)
Interventional Radiology Procedure Note  Date of Procedure: 10/01/2022  Procedure: CT drain placement   Findings:  1. CT drain placement, intra-abdominal right side 12 Fr to bulb suction    Complications: No immediate complications noted.   Estimated Blood Loss: minimal  Follow-up and Recommendations: 1. Bulb suction, flush x3 daily, follow up per surgery    Olive Bass, MD  Vascular & Interventional Radiology  10/01/2022 3:17 PM

## 2022-10-01 NOTE — Progress Notes (Signed)
OT Cancellation Note  Patient Details Name: Felicia Hughes MRN: [id number] DOB: December 15, 1933   Cancelled Treatment:    Reason Eval/Treat Not Completed: Patient at procedure or test/ unavailable. Pt not in room upon attempt. Will re-attempt OT Evaluation at later date/time as pt is available and medically appropriate.   Arman Filter., MPH, MS, OTR/L ascom [phone] 10/01/22, 3:13 PM

## 2022-10-01 NOTE — Progress Notes (Signed)
Patient ID: Felicia Hughes, female   DOB: 04-16-1933, 89 y.o.   MRN: [id number]     SURGICAL PROGRESS NOTE   Hospital Day(s): 1.   Interval History: Patient seen and examined, no acute events or new complaints overnight. Patient reports feeling better this morning.  She still has significant pain in need of frequent pain medications.  No clinical sideration.  Pain localized today to the left side of the abdomen.  Vital signs in last 24 hours: [min-max] current  Temp:  [97.9 F (36.6 C)-98.9 F (37.2 C)] 98.6 F (37 C) (07/11 1304) Pulse Rate:  [90-102] 99 (07/11 1510) Resp:  [14-21] 19 (07/11 1510) BP: (137-153)/(67-79) 144/79 (07/11 1510) SpO2:  [95 %-99 %] 97 % (07/11 1510)             Physical Exam:  Constitutional: alert, cooperative and no distress  Respiratory: breathing non-labored at rest  Cardiovascular: regular rate and sinus rhythm  Gastrointestinal: soft, moderate-tender in the right upper quadrant, and non-distended  Labs:     Latest Ref Rng & Units 09/15/2022    5:13 AM 09/14/2022    7:54 AM 09/05/2022    5:30 AM  CBC  WBC 4.0 - 10.5 K/uL 14.3  27.4  16.5   Hemoglobin 12.0 - 15.0 g/dL 82.9  56.2  13.0   Hematocrit 36.0 - 46.0 % 37.6  41.8  32.8   Platelets 150 - 400 K/uL 442  530  320       Latest Ref Rng & Units 09/21/2022    4:36 AM 09/20/2022   10:55 AM 09/16/2022    4:24 AM  CMP  Glucose 70 - 99 mg/dL 98  84  865   BUN 8 - 23 mg/dL 16  15  35   Creatinine 0.44 - 1.00 mg/dL 7.84  6.96  2.95   Sodium 135 - 145 mmol/L 140  137  136   Potassium 3.5 - 5.1 mmol/L 3.5  2.2  3.6   Chloride 98 - 111 mmol/L 103  102  104   CO2 22 - 32 mmol/L 25  25  25    Calcium 8.9 - 10.3 mg/dL 7.8  7.9  8.2   Total Protein 6.5 - 8.1 g/dL   5.2   Total Bilirubin 0.3 - 1.2 mg/dL   0.6   Alkaline Phos 38 - 126 U/L   57   AST 15 - 41 U/L   34   ALT 0 - 44 U/L   21     Imaging studies: No new pertinent imaging studies   Assessment/Plan:  89 y.o. female with intra-abdominal  abscess, complicated by pertinent comorbidities including recent duodenal perforation.  -History of duodenal perforation s/p repair 2 weeks ago -Seen 2 days ago to my office with no improvement abdominal pain.  Labs shows leukocytosis and CT scan showed large intra-abdominal abscess, no free fluid or free air.  Seems to be contained. -Discussed with IR.  Patient will have percutaneous drainage of intra-abdominal collection. -Will continue IV antibiotic therapy  -PT/OT eval -Continue pain management  Gae Gallop, MD

## 2022-10-01 NOTE — Consult Note (Addendum)
Chief Complaint: Patient was seen in consultation today for abdominal abscess needing drain placement at the request of Cintron-Diaz,Edgardo  Referring Physician(s): Cintron-Diaz,Edgardo  Supervising Physician: Pernell Dupre  Patient Status: [facility] - In-pt  History of Present Illness: Felicia Hughes is a 89 y.o. female with PMHx significant for recent duodenal ulcer perforation s/p Ex Laparotomy with repair and graham patch on 09/14/22. Patient was admitted to hospital 7/10 with abdominal pain and decreased appetite, labs reveal leukocytosis and CT with findings of a abdominal fluid collection concerning for abscess vs seroma.  The patient denies any current chest pain or shortness of breath. She denies any current blood thinner use. The patient denies any history of sleep apnea or chronic oxygen use. She has no known complications to sedation.    Past Medical History:  Diagnosis Date   Arrhythmia    Chronic kidney disease    UTI   GERD (gastroesophageal reflux disease)    Glaucoma (increased eye pressure)    Hypercholesterolemia    Hypertension     Past Surgical History:  Procedure Laterality Date   ABDOMINAL HYSTERECTOMY     BREAST BIOPSY Right 06/11/2015   Procedure: BREAST BIOPSY WITH NEEDLE LOCALIZATION;  Surgeon: Nadeen Landau, MD;  Location: [facility] ORS;  Service: General;  Laterality: Right;   BREAST EXCISIONAL BIOPSY Right 06/11/2015   papilloma   BREAST SURGERY Right    Breast Needle Biopsy   CHOLECYSTECTOMY     DILATION AND CURETTAGE OF UTERUS     EYE SURGERY Bilateral    Cataract Extraction with IOL   INGUINAL HERNIA REPAIR Right 09/04/2022   Procedure: HERNIA REPAIR INGUINAL ADULT;  Surgeon: Leafy Ro, MD;  Location: [facility] ORS;  Service: General;  Laterality: Right;   LAPAROSCOPIC OOPHORECTOMY     LAPAROTOMY N/A 09/14/2022   Procedure: EXPLORATORY LAPAROTOMY, closure of duodenum with omentum patch;  Surgeon: Carolan Shiver, MD;  Location: [facility]  ORS;  Service: General;  Laterality: N/A;    Allergies: Nitrofurantoin and Sulfa antibiotics  Medications: Prior to Admission medications   Medication Sig Start Date End Date Taking? Authorizing Provider  acetaminophen (TYLENOL) 325 MG tablet Take 2 tablets (650 mg total) by mouth every 8 (eight) hours as needed for mild pain. 09/05/22 10/05/22 Yes Sakai, Isami, DO  atorvastatin (LIPITOR) 20 MG tablet Take 20 mg by mouth daily at 6 PM.   Yes [provider]  cholecalciferol (VITAMIN D) 1000 units tablet Take 1,000 Units by mouth daily.   Yes [provider]  docusate sodium (COLACE) 100 MG capsule Take 1 capsule (100 mg total) by mouth 2 (two) times daily as needed for up to 10 days for mild constipation. 09/21/22 10/01/22 Yes Enedina Finner, MD  losartan (COZAAR) 50 MG tablet Take 1 tablet (50 mg total) by mouth daily. 09/22/22  Yes Enedina Finner, MD  melatonin 5 MG TABS Take 0.5 tablets (2.5 mg total) by mouth at bedtime as needed. 09/21/22  Yes Enedina Finner, MD  Multiple Vitamins-Minerals (CENTRUM SILVER 50+WOMEN) TABS Take by mouth.   Yes [provider]  pantoprazole (PROTONIX) 40 MG tablet Take 1 tablet (40 mg total) by mouth 2 (two) times daily. 09/21/22  Yes Enedina Finner, MD  sucralfate (CARAFATE) 1 g tablet Take 1 g by mouth 4 (four) times daily -  with meals and at bedtime. 09/29/22 09/29/23 Yes [provider]  Vibegron (GEMTESA) 75 MG TABS Take 1 tablet (75 mg total) by mouth daily. 07/30/22  Yes Stoioff, Verna Czech,  MD  HYDROcodone-acetaminophen (NORCO/VICODIN) 5-325 MG tablet Take 1 tablet by mouth every 6 (six) hours as needed for moderate pain. Patient not taking: Reported on 09/30/2022 09/21/22   Enedina Finner, MD     Family History  Problem Relation Age of Onset   Breast cancer Mother 75       early 22's   Kidney cancer Neg Hx    Kidney disease Neg Hx    Prostate cancer Neg Hx    Bladder Cancer Neg Hx     Social History   Socioeconomic History   Marital  status: Single    Spouse name: Not on file   Number of children: Not on file   Years of education: Not on file   Highest education level: Not on file  Occupational History   Not on file  Tobacco Use   Smoking status: Never   Smokeless tobacco: Never  Vaping Use   Vaping status: Never Used  Substance and Sexual Activity   Alcohol use: Yes    Alcohol/week: 1.0 standard drink of alcohol    Types: 1 Glasses of wine per week    Comment: wine daily   Drug use: No   Sexual activity: Yes    Birth control/protection: None  Other Topics Concern   Not on file  Social History Narrative   Not on file   Social Determinants of Health   Financial Resource Strain: Low Risk  (06/29/2022)   Received from [facility], [facility]   Overall Financial Resource Strain (CARDIA)    Difficulty of Paying Living Expenses: Not hard at all  Food Insecurity: No Food Insecurity (09/14/2022)   Hunger Vital Sign    Worried About Running Out of Food in the Last Year: Never true    Ran Out of Food in the Last Year: Never true  Transportation Needs: No Transportation Needs (09/14/2022)   PRAPARE - Administrator, Civil Service (Medical): No    Lack of Transportation (Non-Medical): No  Physical Activity: Not on file  Stress: Not on file  Social Connections: Not on file    Review of Systems: A 12 point ROS discussed and pertinent positives are indicated in the HPI above.  All other systems are negative.  Review of Systems  Vital Signs: BP (!) 151/72 (BP Location: Right Arm)   Pulse (!) 101   Temp 98.7 F (37.1 C) (Oral)   Resp 18   SpO2 97%   Physical Exam Constitutional:      General: She is not in acute distress. HENT:     Head: Normocephalic and atraumatic.  Cardiovascular:     Rate and Rhythm: Tachycardia present.  Pulmonary:     Effort: Pulmonary effort is normal. No respiratory distress.  Abdominal:     General: There is no distension.      Palpations: Abdomen is soft.     Tenderness: There is abdominal tenderness.  Skin:    General: Skin is warm and dry.  Neurological:     Mental Status: She is alert and oriented to person, place, and time.     Imaging: CT ABDOMEN PELVIS W CONTRAST  Result Date: 09/30/2022 CLINICAL DATA:  History of a perforated duodenal ulcer in June 2024. EXAM: CT ABDOMEN AND PELVIS WITH CONTRAST TECHNIQUE: Multidetector CT imaging of the abdomen and pelvis was performed using the standard protocol following bolus administration of intravenous contrast. RADIATION DOSE REDUCTION: This exam was performed according to the departmental dose-optimization  program which includes automated exposure control, adjustment of the mA and/or kV according to patient size and/or use of iterative reconstruction technique. CONTRAST:  80mL OMNIPAQUE IOHEXOL 300 MG/ML  SOLN COMPARISON:  September 14, 2022, May 25, 2016 FINDINGS: Lower chest: Small bilateral pleural effusions. Hepatobiliary: Revisualization of a dilated common bile duct measuring approximately 10 mm in diameter, similar in comparison to prior. Status post cholecystectomy. No new suspicious focal hepatic lesion. Pancreas: Similar prominence of the pancreatic duct without frank dilation. No peripancreatic fat stranding. Spleen: Unremarkable. Adrenals/Urinary Tract: Adrenal glands are unremarkable. No hydronephrosis. No obstructing nephrolithiasis. Bladder is decompressed with mild submucosal l wall enhancement. No new suspicious renal lesion. Stomach/Bowel: There is persistent bowel wall thickening of the duodenum, favored overall of improved since most recent prior. There is a new rim enhancing mixed density fluid collection with several internal locules of fat immediately adjacent to the proximal duodenum. It spans approximately 6.8 x 3.6 x 6.3 cm (series 2, image 32). No pneumoperitoneum. No evidence of bowel obstruction. Diverticulosis. Hiatal hernia. Prominent appearance of  the pylorus, similar in comparison to priors. Vascular/Lymphatic: Atherosclerotic calcifications of the nonaneurysmal abdominal aorta. No new suspicious lymphadenopathy. Reproductive: Status post hysterectomy. No adnexal masses. Other: Postsurgical changes along the anterior abdominal wall. Trace free fluid in the pelvis. Musculoskeletal: Grade 1 anterolisthesis of L4-5. Levocurvature of the lumbar spine. IMPRESSION: 1. There is a new 6.8 cm rim enhancing mixed density fluid collection with several internal locules of fat immediately adjacent to the proximal duodenum in the expected region of the site of omental patch. Differential considerations include postsurgical seroma or abscess formation. Recommend correlation with any infectious clinical symptomatology. 2. Small bilateral pleural effusions. Aortic Atherosclerosis (ICD10-I70.0). These results will be called to the ordering clinician or representative by the Radiologist Assistant, and communication documented in the PACS or [employer]. Electronically Signed   By: Meda Klinefelter M.D.   On: 09/30/2022 15:18   DG UGI W SINGLE CM (SOL OR THIN BA)  Result Date: 09/16/2022 CLINICAL DATA:  89 year old female 2 days status post ex lap for perforated duodenal ulcer repair. Patient referred for water-soluble upper GI study to evaluate for leak. EXAM: DG UGI W WATER-SOLUBLE CONTRAST TECHNIQUE: Scout radiograph was obtained. Single contrast examination was performed using 100 mL water-soluble contrast. This exam was performed by Alex Gardener, NP, and was supervised and interpreted by Elige Ko, MD. FLUOROSCOPY: Radiation Exposure Index (as provided by the fluoroscopic device): 43.10 mGy Kerma COMPARISON:  None Available. FINDINGS: Scout Radiograph: Nasogastric tube with the tip projecting over the stomach. No bowel dilatation to suggest obstruction. No evidence of pneumoperitoneum, portal venous gas or pneumatosis. No pathologic calcifications along the  expected course of the ureters. No acute osseous abnormality. Surgical clips in the midline. 100 mL Omnipaque 300 was hand injected through a nasogastric tube. Stomach: Normal appearance. No hiatal hernia. Gastric emptying: Normal. Duodenum: Normal appearance. No extraluminal contrast to suggest a leak. Other:  None. IMPRESSION: No extraluminal contrast to suggest a duodenal leak. Electronically Signed   By: Elige Ko M.D.   On: 09/16/2022 15:26   CT ABDOMEN PELVIS WO CONTRAST  Result Date: 09/14/2022 CLINICAL DATA:  Postop right inguinal hernia repair, pain EXAM: CT ABDOMEN AND PELVIS WITHOUT CONTRAST TECHNIQUE: Multidetector CT imaging of the abdomen and pelvis was performed following the standard protocol without IV contrast. RADIATION DOSE REDUCTION: This exam was performed according to the departmental dose-optimization program which includes automated exposure control, adjustment of the mA and/or kV according  to patient size and/or use of iterative reconstruction technique. COMPARISON:  09/14/2022 at 8:52 a.m. FINDINGS: Lower chest: Trace bilateral pleural effusions. Stable hiatal hernia. Hepatobiliary: Cholecystectomy. Unremarkable unenhanced appearance of the liver. Pancreas: Unremarkable unenhanced appearance. Spleen: Unremarkable unenhanced appearance. Adrenals/Urinary Tract: Residual contrast within the kidneys from earlier CT exam. No acute finding. The adrenals are unremarkable. Contrast fills the bladder lumen, with no filling defect. Stomach/Bowel: In the interim since the prior study, diffuse pneumoperitoneum has developed consistent with perforated viscus. Extraluminal gas is seen adjacent to the gastric pylorus, and this could reflect the site of perforation. Surgical consultation is recommended. Wall thickening of the small and large bowel again identified consistent with enterocolitis. Wall thickening is most pronounced within the distal small bowel and proximal colon, as well as within  the gastric antrum and proximal duodenum. No evidence of bowel obstruction or ileus. Vascular/Lymphatic: Aortic atherosclerosis. No enlarged abdominal or pelvic lymph nodes. Reproductive: Status post hysterectomy. No adnexal masses. Other: Diffuse pneumoperitoneum as described above, with increasing free fluid throughout the abdomen and pelvis consistent with perforated viscus. No abdominal wall hernia. Postsurgical changes from right inguinal hernia repair. Musculoskeletal: No acute or destructive bony abnormalities. Reconstructed images demonstrate no additional findings. IMPRESSION: 1. Interval development of diffuse pneumoperitoneum and increasing free fluid throughout the abdomen and pelvis, consistent with perforated viscus. Site of perforation may be within the gastric antrum/pylorus, where prominent wall thickening and adjacent extraluminal gas are identified reference images 37-43 of series 2. Surgical consultation recommended. 2. Stable diffuse wall thickening of the distal small bowel and proximal colon consistent with enterocolitis. 3. Trace bilateral pleural effusions. 4. Hiatal hernia. 5.  Aortic Atherosclerosis (ICD10-I70.0). Critical Value/emergent results were called by telephone at the time of interpretation on 09/14/2022 at 7:38 pm to provider [facility] , who verbally acknowledged these results. Electronically Signed   By: Sharlet Salina M.D.   On: 09/14/2022 19:39   CT CHEST WO CONTRAST  Result Date: 09/14/2022 CLINICAL DATA:  Abnormal xray - lung opacity/opacities EXAM: CT CHEST WITHOUT CONTRAST TECHNIQUE: Multidetector CT imaging of the chest was performed following the standard protocol without IV contrast. RADIATION DOSE REDUCTION: This exam was performed according to the departmental dose-optimization program which includes automated exposure control, adjustment of the mA and/or kV according to patient size and/or use of iterative reconstruction technique. COMPARISON:  CT abdomen  pelvis 09/14/2022 8:59 a.m., CT abdomen pelvis 09/03/2022 FINDINGS: Cardiovascular: Normal heart size. No significant pericardial effusion. The thoracic aorta is normal in caliber. Moderate atherosclerotic plaque of the thoracic aorta. At least 3 vessel coronary artery calcifications. Aortic valve leaflet calcification. Likely mitral annular calcification. Mediastinum/Nodes: No enlarged mediastinal or axillary lymph nodes. Thyroid gland, trachea are unremarkable. Diffuse esophageal wall thickening. Small hiatal hernia. Lungs/Pleura: Biapical pleural/pulmonary scarring. No focal consolidation. No pulmonary nodule. No pulmonary mass. No pleural effusion. No pneumothorax. Upper Abdomen: Interval increase in size of at least small volume intraperitoneal gas. Interval increase in size of at least small to moderate volume, likely complex, intraperitoneal free fluid. Musculoskeletal: No chest wall abnormality. No suspicious lytic or blastic osseous lesions. No acute displaced fracture. IMPRESSION: 1. Interval increase in size of at least small volume intraperitoneal gas. Interval increase in size of at least small to moderate volume, likely complex, intraperitoneal free fluid. Findings suggestive of bowel perforation. Recommend surgical consultation. If follow-up CT abdomen pelvis repeated, please obtain with IV and PO contrast. 2. Diffuse esophageal wall thickening. Small hiatal hernia. Correlate with signs and symptoms of esophagitis. Consider  direct visualization. 3. Aortic Atherosclerosis (ICD10-I70.0) including coronary artery, mitral annular, aortic valvular calcification-correlate with aortic stenosis. These results will be called to the ordering clinician or representative by the Radiologist Assistant, and communication documented in the PACS or [employer]. Electronically Signed   By: Tish Frederickson M.D.   On: 09/14/2022 19:10   CT ABDOMEN PELVIS W CONTRAST  Result Date: 09/14/2022 CLINICAL DATA:  Severe  postop abdominal pain. Recently postop from surgical repair of inguinal hernia and small bowel obstruction. EXAM: CT ABDOMEN AND PELVIS WITH CONTRAST TECHNIQUE: Multidetector CT imaging of the abdomen and pelvis was performed using the standard protocol following bolus administration of intravenous contrast. RADIATION DOSE REDUCTION: This exam was performed according to the departmental dose-optimization program which includes automated exposure control, adjustment of the mA and/or kV according to patient size and/or use of iterative reconstruction technique. CONTRAST:  75mL OMNIPAQUE IOHEXOL 300 MG/ML  SOLN COMPARISON:  09/03/2022 FINDINGS: Lower Chest: No acute findings. Hepatobiliary: No hepatic masses identified. Prior cholecystectomy. Diffuse biliary ductal dilatation is again seen. Pancreas:  No mass or inflammatory changes. Spleen: Within normal limits in size and appearance. Adrenals/Urinary Tract: No suspicious masses identified. No evidence of ureteral calculi or hydronephrosis. Stomach/Bowel: Tiny amount of postop free intraperitoneal air is noted. There has been surgical repair of right inguinal hernia and resolution of small bowel obstruction since prior study. Mild diffuse small bowel wall thickening is seen as well as wall thickening and mucosal enhancement of the ascending and transverse colon. This is consistent with enterocolitis. Small amount ascites noted, without evidence of abscess. Vascular/Lymphatic: No pathologically enlarged lymph nodes. No acute vascular findings. Reproductive: Prior hysterectomy noted. Adnexal regions are unremarkable in appearance. Other:  None. Musculoskeletal:  No suspicious bone lesions identified. IMPRESSION: Interval surgical repair of right inguinal hernia, with resolution of small bowel obstruction since prior study. Tiny amount of residual postop free air noted. Mild diffuse small bowel wall thickening and mucosal enhancement of ascending and transverse colon,  consistent with enterocolitis. Small amount of ascites. No evidence of abscess. Stable diffuse biliary ductal dilatation, which may be due to prior cholecystectomy. Recommend correlation with liver function tests, and consider abdomen MRI and MRCP without and with contrast for further evaluation if clinically warranted. Electronically Signed   By: Danae Orleans M.D.   On: 09/14/2022 09:40   DG Chest 1 View  Result Date: 09/06/2022 CLINICAL DATA:  Cough EXAM: CHEST  1 VIEW COMPARISON:  None. FINDINGS: Hyperinflation of the lungs. Nonspecific 1.4 cm nodular opacity overlies the right upper lobe. Extensive bronchitic changes with probable bronchiectasis in the medial aspects of the right middle lobe and lingula. Cardiac and mediastinal contours are within normal limits. Mild atherosclerotic calcification in the transverse aorta. No acute osseous abnormality. IMPRESSION: 1. 1.4 cm right upper lobe pulmonary nodule may be infectious, inflammatory or neoplastic. Recommend further evaluation with CT scan of the chest. 2. Hyperinflation, bronchitic changes and probable bronchiectasis in the medial right middle lobe and lingula. Findings suggest the possibility of chronic indolent atypical infection such as MAI. Electronically Signed   By: Malachy Moan M.D.   On: 09/06/2022 09:59   CT ABDOMEN PELVIS W CONTRAST  Result Date: 09/03/2022 CLINICAL DATA:  Right-sided abdominal pain for a few hours. History of hernia. EXAM: CT ABDOMEN AND PELVIS WITH CONTRAST TECHNIQUE: Multidetector CT imaging of the abdomen and pelvis was performed using the standard protocol following bolus administration of intravenous contrast. RADIATION DOSE REDUCTION: This exam was performed according to the  departmental dose-optimization program which includes automated exposure control, adjustment of the mA and/or kV according to patient size and/or use of iterative reconstruction technique. CONTRAST:  OMNIPAQUE IOHEXOL 300 MG/ML  SOLN  COMPARISON:  CT abdomen/pelvis 03/06/2019 FINDINGS: Lower chest: The lung bases are clear. The imaged heart is unremarkable. Hepatobiliary: The liver is unremarkable. The gallbladder is surgically absent there is mild intra and extrahepatic biliary ductal dilation with the common bile duct measuring up to 9 mm, new since 2020. Pancreas: There is mild dilation of the main pancreatic duct which is also new since 2020. No definite focal lesion is seen. There is no peripancreatic inflammatory change. Spleen: Unremarkable. Adrenals/Urinary Tract: The adrenals are unremarkable. There are areas of cortical thinning/scarring in both kidneys. Small hypodense lesions likely reflect benign cysts requiring no specific imaging follow-up. There are no suspicious renal lesions. There is symmetric excretion of contrast into the collecting systems on the delayed images. There is no hydronephrosis or hydroureter. There is no stone in either kidney or along the course of either ureter. The bladder is decompressed but grossly unremarkable. Stomach/Bowel: There is mild circumferential distal esophageal wall thickening. The stomach is distended. There is dilation of multiple small bowel loops measuring up to 3.0 cm in diameter. This extends to the level of the right inguinal hernia which contains a loop of small bowel. The small bowel distal to the hernia is decompressed, consistent with high-grade obstruction. There is no pneumatosis intestinalis. There is colonic diverticulosis without evidence of acute diverticulitis. Vascular/Lymphatic: There is mild calcified plaque in the nonaneurysmal abdominal aorta. The major branch vessels are patent. The main portal and splenic veins are patent. There is no portal venous gas. There is no abdominal or pelvic lymphadenopathy. Reproductive: The uterus is surgically absent. There is no adnexal mass. Other: There is small volume free fluid in the pelvis and around the liver. There is no free  intraperitoneal air. As above, there is a right inguinal hernia containing a loop of small bowel as well as ascitic fluid. Musculoskeletal: There is no acute osseous abnormality or suspicious osseous lesion. There is grade 1 anterolisthesis of L4 on L5. IMPRESSION: 1. High-grade small-bowel obstruction due to a right inguinal hernia which contains a loop of small bowel and a small amount of fluid. No pneumatosis intestinalis or portal venous gas. 2. Small volume ascites in the abdomen and pelvis. 3. Mild intra and extrahepatic biliary ductal dilation and main pancreatic duct dilation, new since 2020. No definite focal lesion is seen; however, a small pancreatic head or ampullary neoplasm can not be excluded. Recommend nonemergent outpatient MRI of the abdomen with and without contrast for further evaluation when clinically appropriate 4. Mild circumferential distal esophageal wall thickening may reflect esophagitis. 5. Colonic diverticulosis without evidence of acute diverticulitis. These results were called by telephone at the time of interpretation on 09/03/2022 at 8:25 pm to provider [facility] , who verbally acknowledged these results. Electronically Signed   By: Lesia Hausen M.D.   On: 09/03/2022 20:26    Labs:  CBC: Recent Labs    09/04/22 0409 09/05/22 0530 09/14/22 0754 09/15/22 0513  WBC 12.9* 16.5* 27.4* 14.3*  HGB 11.8* 10.5* 13.8 12.2  HCT 36.6 32.8* 41.8 37.6  PLT 346 320 530* 442*    COAGS: Recent Labs    10/01/22 0831  INR 1.1    BMP: Recent Labs    09/15/22 0513 09/16/22 0424 09/20/22 1055 09/21/22 0436  NA 135 136 137 140  K  4.4 3.6 2.2* 3.5  CL 108 104 102 103  CO2 17* 25 25 25   GLUCOSE 130* 113* 84 98  BUN 29* 35* 15 16  CALCIUM 8.0* 8.2* 7.9* 7.8*  CREATININE 1.16* 1.00 0.69 0.71  GFRNONAA 46* 55* >60 >60    LIVER FUNCTION TESTS: Recent Labs    09/03/22 1619 09/14/22 0754 09/15/22 0513 09/16/22 0424  BILITOT 0.8 0.8 0.7 0.6  AST 24 28 27  34   ALT 18 24 19 21   ALKPHOS 75 66 47 57  PROT 6.7 6.4* 4.6* 5.2*  ALBUMIN 4.3 3.7 2.4* 2.7*    Assessment and Plan: This is a 89 year old female with PMHx significant for recent duodenal ulcer perforation s/p Ex Laparotomy with repair and graham patch on 09/14/22. Patient was admitted to hospital 7/10 with abdominal pain, decreased appetite, labs reveal leukocytosis and CT with findings of a abdominal fluid collection concerning for abscess vs seroma.   Request received for IR consult.  The patient has been NPO, no blood thinners taken, imaging, labs and vitals have been reviewed.  Risks and benefits discussed with the patient including bleeding, infection, damage to adjacent structures, bowel perforation/fistula connection, and sepsis. The patient is aware that this drain will likely stay in place after discharge and she will require follow-up appointments.   All of the patient's questions were answered, patient is agreeable to proceed. Consent signed and in chart.   Thank you for this interesting consult.  I greatly enjoyed meeting Felicia Halteman and look forward to participating in their care.  A copy of this report was sent to the requesting provider on this date.  Electronically Signed: Berneta Levins, PA-C 10/01/2022, 9:55 AM   I spent a total of 20 Minutes in face to face in clinical consultation, greater than 50% of which was counseling/coordinating care for abdominal abscess.

## 2022-10-02 LAB — CBC
HCT: 32.7 % — ABNORMAL LOW (ref 36.0–46.0)
Hemoglobin: 10.6 g/dL — ABNORMAL LOW (ref 12.0–15.0)
MCH: 32.2 pg (ref 26.0–34.0)
MCHC: 32.4 g/dL (ref 30.0–36.0)
MCV: 99.4 fL (ref 80.0–100.0)
Platelets: 631 10*3/uL — ABNORMAL HIGH (ref 150–400)
RBC: 3.29 MIL/uL — ABNORMAL LOW (ref 3.87–5.11)
RDW: 13.3 % (ref 11.5–15.5)
WBC: 13.7 10*3/uL — ABNORMAL HIGH (ref 4.0–10.5)
nRBC: 0 % (ref 0.0–0.2)

## 2022-10-02 LAB — MAGNESIUM: Magnesium: 1.7 mg/dL (ref 1.7–2.4)

## 2022-10-02 LAB — BASIC METABOLIC PANEL
Anion gap: 9 (ref 5–15)
BUN: 10 mg/dL (ref 8–23)
CO2: 23 mmol/L (ref 22–32)
Calcium: 8 mg/dL — ABNORMAL LOW (ref 8.9–10.3)
Chloride: 108 mmol/L (ref 98–111)
Creatinine, Ser: 0.78 mg/dL (ref 0.44–1.00)
GFR, Estimated: >60 mL/min (ref >60)
Glucose, Bld: 84 mg/dL (ref 70–99)
Potassium: 3.4 mmol/L — ABNORMAL LOW (ref 3.5–5.1)
Sodium: 140 mmol/L (ref 135–145)

## 2022-10-02 LAB — PHOSPHORUS: Phosphorus: 3.6 mg/dL (ref 2.5–4.6)

## 2022-10-02 NOTE — TOC Progression Note (Signed)
Transition of Care (TOC) - Progression Note    Patient Details  Name: Felicia Hughes MRN: [id number] Date of Birth: September 22, 1933  Transition of Care [facility]) CM/SW Contact  Chapman Fitch, RN Phone Number: 10/02/2022, 2:07 PM  Clinical Narrative:    Per MD anticipated medical readiness Monday Confirmed with Selena Batten at [facility] that patient can not return over the weekend [facility] to follow up with surgeon over the weekend to confirm patient is ready for auth to be initiated    Expected Discharge Plan: Skilled Nursing Facility    Expected Discharge Plan and Services                                               Social Determinants of Health (SDOH) Interventions SDOH Screenings   Food Insecurity: No Food Insecurity (09/14/2022)  Housing: Low Risk  (09/14/2022)  Transportation Needs: No Transportation Needs (09/14/2022)  Utilities: Not At Risk (09/14/2022)  Financial Resource Strain: Low Risk  (06/29/2022)   Received from [facility], [facility]  Tobacco Use: Low Risk  (10/01/2022)    Readmission Risk Interventions    09/05/2022   11:39 AM  Readmission Risk Prevention Plan  Post Dischage Appt Complete  Medication Screening Complete  Transportation Screening Complete

## 2022-10-02 NOTE — Progress Notes (Signed)
Patient ID: Felicia Hughes, female   DOB: [birth year], 89 y.o.   MRN: [id number]     SURGICAL PROGRESS NOTE   Hospital Day(s): 2.   Interval History: Patient seen and examined, no acute events or new complaints overnight. Patient reports not feeling much better.  She continued having abdominal pain.  Denies any nausea or vomiting.  She is asking to advance her diet.  Vital signs in last 24 hours: [min-max] current  Temp:  [98.3 F (36.8 C)-99.1 F (37.3 C)] 98.4 F (36.9 C) (07/12 0748) Pulse Rate:  [91-101] 91 (07/12 0748) Resp:  [15-24] 15 (07/12 0748) BP: (133-151)/(60-79) 138/60 (07/12 0748) SpO2:  [92 %-98 %] 95 % (07/12 0748)             Physical Exam:  Constitutional: alert, cooperative and no distress  Respiratory: breathing non-labored at rest  Cardiovascular: regular rate and sinus rhythm  Gastrointestinal: soft, moderate tender on the left abdomen, and non-distended.  Drain in place.  Labs:     Latest Ref Rng & Units 10/02/2022    8:37 AM 09/15/2022    5:13 AM 09/14/2022    7:54 AM  CBC  WBC 4.0 - 10.5 K/uL 13.7  14.3  27.4   Hemoglobin 12.0 - 15.0 g/dL 40.9  81.1  91.4   Hematocrit 36.0 - 46.0 % 32.7  37.6  41.8   Platelets 150 - 400 K/uL 631  442  530       Latest Ref Rng & Units 10/02/2022    8:37 AM 09/21/2022    4:36 AM 09/20/2022   10:55 AM  CMP  Glucose 70 - 99 mg/dL 84  98  84   BUN 8 - 23 mg/dL 10  16  15    Creatinine 0.44 - 1.00 mg/dL 7.82  9.56  2.13   Sodium 135 - 145 mmol/L 140  140  137   Potassium 3.5 - 5.1 mmol/L 3.4  3.5  2.2   Chloride 98 - 111 mmol/L 108  103  102   CO2 22 - 32 mmol/L 23  25  25    Calcium 8.9 - 10.3 mg/dL 8.0  7.8  7.9     Imaging studies: No new pertinent imaging studies   Assessment/Plan:  89 y.o. female with intra-abdominal abscess status post percutaneous drainage, complicated by pertinent comorbidities including recent duodenal perforation.   -Patient with stable vital signs.  No fever -Percutaneous drain placed  yesterday.  Still purulent. -White blood cells continue elevated -Will continue with IV antibiotic therapy and drain in place -Advance diet to heart healthy diet -Encourage patient to ambulate -Pain management  Gae Gallop, MD

## 2022-10-02 NOTE — Evaluation (Signed)
Physical Therapy Evaluation Patient Details Name: Felicia Hughes MRN: [id number] DOB: June 10, 1933 Today's Date: 10/02/2022  History of Present Illness  89 y.o female with significant PMH of CKD, GERD, HLD, HTN, s/p cholecystectomy, duodenal perforation s/p repair on 6/24 and discharged to rehab. Pt now readmitted for intra-abdominal abscess, s/p drain placement on 7/11.  Clinical Impression  Pt pleasant and motivated for therapy. She was shown how to perform log-rolling technique at start of session to help with abdominal pain control since drain placement. Pt needed verbal/tactile cueing to roll onto her left side, then min A for trunk/LE management to go from sidelying-sit. Pt denied any dizziness symptoms with sitting EOB; vitals remained stable, with BP and HR in supine at 154/69 mmHg and 99 bpm, in sitting, BP 161/75 and HR 98. Pt able to perform STS from elevated bed height with min guard and RW for safety, no physical assistance or cueing needed. Pt reported feeling a little dizzy with standing but was able to tolerate static standing for ~1-2 mins to take BP; vitals in standing: BP 161/76, HR 104. Pt reported feeling better after BP was taken and was then able to ambulate ~60 feet with RW and min guard for safety. She needed occasional VC's to keep the RW closer to her, but had no instances of LOB or legs buckling. Pt demonstrated good eccentric control with sitting down to recliner upon return to room. Vitals immediately post-amb were HR O2 96% on room air and HR at 108 bpm; HR decreased to 98 bpm after 1-2 mins rest in recliner. Pt will benefit from continued PT services upon discharge to safely address deficits listed in patient problem list for decreased caregiver assistance and eventual return to PLOF.       Assistance Recommended at Discharge Intermittent Supervision/Assistance  If plan is discharge home, recommend the following:  Can travel by private vehicle  A little help with walking  and/or transfers;A little help with bathing/dressing/bathroom;Assistance with cooking/housework;Assist for transportation;Help with stairs or ramp for entrance        Equipment Recommendations Other (comment) (TBD)  Recommendations for Other Services       Functional Status Assessment Patient has had a recent decline in their functional status and demonstrates the ability to make significant improvements in function in a reasonable and predictable amount of time.     Precautions / Restrictions Precautions Precautions: Fall Precaution Comments: abdominal incisions, RLQ JP bulb drain Restrictions Weight Bearing Restrictions: No      Mobility  Bed Mobility Overal bed mobility: Needs Assistance Bed Mobility: Rolling, Sidelying to Sit Rolling: Supervision Sidelying to sit: Min assist, +2 for physical assistance       General bed mobility comments: Pt able to roll to L side with cueing for UE/LE placement. Min A for trunk and LE management to come from sidelying to sit.    Transfers Overall transfer level: Needs assistance Equipment used: Rolling walker (2 wheels) Transfers: Sit to/from Stand Sit to Stand: Min guard, From elevated surface           General transfer comment: Pt need no physical assistance to stand from elevated bed height.    Ambulation/Gait Ambulation/Gait assistance: Min guard Gait Distance (Feet): 60 Feet Assistive device: Rolling walker (2 wheels) Gait Pattern/deviations: Step-through pattern, Decreased step length - left, Decreased step length - right, Trunk flexed Gait velocity: decreased     General Gait Details: Pt able to ambulate ~60 feet with RW and min guard for safety. Occasional VC's  for proper RW placement.  Stairs            Wheelchair Mobility     Tilt Bed    Modified Rankin (Stroke Patients Only)       Balance Overall balance assessment: Needs assistance Sitting-balance support: Feet unsupported Sitting  balance-Leahy Scale: Good     Standing balance support: Single extremity supported Standing balance-Leahy Scale: Good Standing balance comment: Pt able to stand with single UE support on RW during standing BP. No LOB while standing or ambulating.                             Pertinent Vitals/Pain Pain Assessment Pain Assessment: No/denies pain Pain Location: Abdomen pain with mobility, none at rest. Pain Intervention(s): Limited activity within patient's tolerance, Monitored during session, Repositioned, RN gave pain meds during session    Home Living Family/patient expects to be discharged to:: Skilled nursing facility  Living Arrangements: Non-relatives/Friends (Her friend "Lollie Sails") Available Help at Discharge: Family;Available PRN/intermittently (Daughter only here through the weekend (lives in [place]), son may be coming at some point but unclear) Type of Home: Apartment at [employer] at [facility] Access: Elevator       Home Layout: One level Home Equipment: Rollator (4 wheels)      Prior Function Prior Level of Function : Independent/Modified Independent             Mobility Comments: Pt reports ambulating independently, says she has a rollator that she got recently but never really used it. 1 fall last year secondary to tripping. Lives in independent apartment with elevator access; walks to dining hall for meals. Described community ambulator. ADLs Comments: Independent with all ADLs.     Hand Dominance        Extremity/Trunk Assessment   Upper Extremity Assessment Upper Extremity Assessment: Generalized weakness    Lower Extremity Assessment Lower Extremity Assessment: Generalized weakness       Communication   Communication: HOH  Cognition Arousal/Alertness: Awake/alert Behavior During Therapy: WFL for tasks assessed/performed Overall Cognitive Status: Within Functional Limits for tasks assessed                                           General Comments      Exercises     Assessment/Plan    PT Assessment Patient needs continued PT services  PT Problem List Decreased strength;Pain;Decreased activity tolerance;Decreased mobility;Decreased knowledge of use of DME       PT Treatment Interventions DME instruction;Gait training;Functional mobility training;Therapeutic activities;Therapeutic exercise;Balance training;Patient/family education    PT Goals (Current goals can be found in the Care Plan section)  Acute Rehab PT Goals Patient Stated Goal: Increased strength, walking PT Goal Formulation: With patient Time For Goal Achievement: 10/15/22 Potential to Achieve Goals: Good    Frequency Min 1X/week     Co-evaluation               AM-PAC PT "6 Clicks" Mobility  Outcome Measure Help needed turning from your back to your side while in a flat bed without using bedrails?: A Little Help needed moving from lying on your back to sitting on the side of a flat bed without using bedrails?: A Little Help needed moving to and from a bed to a chair (including a wheelchair)?: A Little Help needed standing up from  a chair using your arms (e.g., wheelchair or bedside chair)?: None Help needed to walk in hospital room?: A Little Help needed climbing 3-5 steps with a railing? : A Lot 6 Click Score: 18    End of Session Equipment Utilized During Treatment: Gait belt Activity Tolerance: Patient tolerated treatment well Patient left: in chair;with call bell/phone within reach;with chair alarm set Nurse Communication: Mobility status PT Visit Diagnosis: History of falling (Z91.81);Pain;Muscle weakness (generalized) (M62.81);Unsteadiness on feet (R26.81);Difficulty in walking, not elsewhere classified (R26.2)    Time: 6578-4696 PT Time Calculation (min) (ACUTE ONLY): 45 min   Charges:                 Haylen Bellotti, SPT 10/02/22, 1:19 PM

## 2022-10-02 NOTE — Evaluation (Signed)
Occupational Therapy Evaluation Patient Details Name: Felicia Hughes MRN: [id number] DOB: 17-Jun-1933 Today's Date: 10/02/2022   History of Present Illness 89 y.o female with significant PMH of CKD, GERD, HLD, HTN, s/p cholecystectomy, duodenal perforation s/p repair on 6/24 and discharged to rehab. Pt now readmitted for intra-abdominal abscess, s/p drain placement on 7/11.   Clinical Impression   Pt was seen for OT evaluation this date. Prior to recent hospital admission, pt was independent with ADL and mobility, living with a friend, Lollie Sails, in an independent living apartment at [place]. Pt presents to acute OT demonstrating impaired ADL performance and functional mobility 2/2 abdominal pain, decreased strength, activity tolerance, and balance (See OT problem list for additional functional deficits). Pt currently requires MAX A to don socks, MIN A to CGA for ADL transfers with RW, and remote supv for seated pericare after toileting. Pt noted mild dizziness at end of session but improved once returned to supine. Pt would benefit from skilled OT services to address noted impairments and functional limitations (see below for any additional details) in order to maximize safety and independence while minimizing falls risk and caregiver burden.    Recommendations for follow up therapy are one component of a multi-disciplinary discharge planning process, led by the attending physician.  Recommendations may be updated based on patient status, additional functional criteria and insurance authorization.   Assistance Recommended at Discharge Frequent or constant Supervision/Assistance  Patient can return home with the following A little help with walking and/or transfers;Assistance with cooking/housework;Help with stairs or ramp for entrance;Assist for transportation;Direct supervision/assist for medications management;A little help with bathing/dressing/bathroom    Functional Status Assessment  Patient has  had a recent decline in their functional status and demonstrates the ability to make significant improvements in function in a reasonable and predictable amount of time.  Equipment Recommendations  Other (comment) (defer to next venue)    Recommendations for Other Services       Precautions / Restrictions Precautions Precautions: Fall Precaution Comments: abdominal incisions, RLQ JP bulb drain Restrictions Weight Bearing Restrictions: No      Mobility Bed Mobility Overal bed mobility: Needs Assistance Bed Mobility: Rolling, Sidelying to Sit, Sit to Sidelying Rolling: Supervision Sidelying to sit: Min assist, HOB elevated     Sit to sidelying: Min assist General bed mobility comments: +time, painful    Transfers Overall transfer level: Needs assistance Equipment used: Rolling walker (2 wheels) Transfers: Sit to/from Stand Sit to Stand: Min guard, Min assist           General transfer comment: MIN A for initial STS from EOB, CGA from std height toilet      Balance Overall balance assessment: Needs assistance Sitting-balance support: Feet supported Sitting balance-Leahy Scale: Good     Standing balance support: No upper extremity supported, During functional activity Standing balance-Leahy Scale: Fair Standing balance comment: static standing at sink for grooming tasks with no overt LOB                           ADL either performed or assessed with clinical judgement   ADL Overall ADL's : Needs assistance/impaired     Grooming: Standing;Supervision/safety;Wash/dry hands               Lower Body Dressing: Sitting/lateral leans;Maximal assistance Lower Body Dressing Details (indicate cue type and reason): don socks Toilet Transfer: Solicitor;Ambulation;Rolling walker (2 wheels);Grab bars   Toileting- Clothing Manipulation and Hygiene: Supervision/safety;Sitting/lateral lean  Functional mobility during ADLs: Min  guard;Rolling walker (2 wheels)       Vision         Perception     Praxis      Pertinent Vitals/Pain Pain Assessment Pain Assessment: 0-10 Pain Score: 6  Pain Location: abdomen Pain Descriptors / Indicators: Aching, Guarding Pain Intervention(s): Limited activity within patient's tolerance, Monitored during session, Repositioned, Patient requesting pain meds-RN notified     Hand Dominance     Extremity/Trunk Assessment Upper Extremity Assessment Upper Extremity Assessment: Generalized weakness   Lower Extremity Assessment Lower Extremity Assessment: Generalized weakness       Communication Communication Communication: No difficulties   Cognition Arousal/Alertness: Awake/alert Behavior During Therapy: WFL for tasks assessed/performed Overall Cognitive Status: Within Functional Limits for tasks assessed                                       General Comments       Exercises     Shoulder Instructions      Home Living Family/patient expects to be discharged to:: Skilled nursing facility Living Arrangements: Non-relatives/Friends Available Help at Discharge: Family Type of Home: Apartment Home Access: Elevator     Home Layout: One level     Bathroom Shower/Tub: Producer, television/film/video: Handicapped height Bathroom Accessibility: Yes   Home Equipment: Agricultural consultant (2 wheels)          Prior Functioning/Environment Prior Level of Function : Needs assist             Mobility Comments: Prior to recent admission, pt ambulated ind up until right before admission. Able to access her apartment via elevator. Could access dining hall on her own. ADLs Comments: Prior to recent admission, pt notes independent with all ADL tasks, light meal prep and takes 1 meal per day in dining hall; endorses 1 fall where she tripped on her bathmat.        OT Problem List: Decreased strength;Pain;Decreased activity tolerance;Decreased  knowledge of use of DME or AE;Impaired balance (sitting and/or standing)      OT Treatment/Interventions: Self-care/ADL training;Therapeutic exercise;Therapeutic activities;DME and/or AE instruction;Patient/family education;Balance training;Energy conservation    OT Goals(Current goals can be found in the care plan section) Acute Rehab OT Goals Patient Stated Goal: get better OT Goal Formulation: With patient Time For Goal Achievement: 10/16/22 Potential to Achieve Goals: Good ADL Goals Pt Will Perform Upper Body Dressing: sitting;with modified independence Pt Will Perform Lower Body Dressing: sit to/from stand;with modified independence Additional ADL Goal #1: Pt will complete all aspects of toileting with modified independence, 3/3 opportunities.  OT Frequency: Min 1X/week    Co-evaluation              AM-PAC OT "6 Clicks" Daily Activity     Outcome Measure Help from another person eating meals?: None Help from another person taking care of personal grooming?: A Little Help from another person toileting, which includes using toliet, bedpan, or urinal?: A Little Help from another person bathing (including washing, rinsing, drying)?: A Little Help from another person to put on and taking off regular upper body clothing?: A Little Help from another person to put on and taking off regular lower body clothing?: A Little 6 Click Score: 19   End of Session Equipment Utilized During Treatment: Rolling walker (2 wheels) Nurse Communication: Mobility status;Other (comment) (RN/MD - pt wants vanilla Ensure, dtr  wants compression stockings for pt, and pt requesting diet order upgraded)  Activity Tolerance: Patient tolerated treatment well Patient left: in bed;with call bell/phone within reach;with bed alarm set  OT Visit Diagnosis: Other abnormalities of gait and mobility (R26.89);Pain Pain - Right/Left:  (abdomen)                Time: 2952-8413 OT Time Calculation (min): 23  min Charges:  OT General Charges $OT Visit: 1 Visit OT Evaluation $OT Eval Low Complexity: 1 Low OT Treatments $Self Care/Home Management : 8-22 mins  Arman Filter., MPH, MS, OTR/L ascom [phone] 10/02/22, 9:40 AM

## 2022-10-03 NOTE — Progress Notes (Signed)
Mobility Specialist - Progress Note     10/03/22 1700  Mobility  Activity Ambulated with assistance in hallway  Level of Assistance Contact guard assist, steadying assist  Assistive Device Front wheel walker  Distance Ambulated (ft) 100 ft  Range of Motion/Exercises Active  Activity Response Tolerated well  Mobility Referral Yes  $Mobility charge 1 Mobility  Mobility Specialist Start Time (ACUTE ONLY) 1600  Mobility Specialist Stop Time (ACUTE ONLY) 1630  Mobility Specialist Time Calculation (min) (ACUTE ONLY) 30 min   Pt resting in bed on RA upon entry. Pt STS and ambulates to hallway around NS with RW CGA. Pt returned to bed and left with needs in reach. Bed alarm activated.   Johnathan Hausen Mobility Specialist 10/03/22, 5:04 PM

## 2022-10-03 NOTE — [place] FL2 (Signed)
[place] MEDICAID FL2 LEVEL OF CARE FORM     IDENTIFICATION  Patient Name: Felicia Hughes Birthdate: Jul 30, 1933 Sex: female Admission Date (Current Location): 09/30/2022  [facility] and [place] Number:  Chiropodist and Address:  [facility], [address], [place], [place] [address]      Provider Number: [id number]  Attending Physician Name and Address:  Carolan Shiver, MD  Relative Name and Phone Number:  Shelby Dubin (Significant other)  [phone]    Current Level of Care: Hospital Recommended Level of Care: Skilled Nursing Facility Prior Approval Number:    Date Approved/Denied:   PASRR Number: [id number]  Discharge Plan: SNF    Current Diagnoses: Patient Active Problem List   Diagnosis Date Noted   Intra-abdominal abscess (HCC) 09/30/2022   Enterocolitis 09/14/2022   Pancolitis (HCC) 09/14/2022   Incarcerated inguinal hernia, unilateral 09/04/2022   Small bowel obstruction (HCC) 09/03/2022   Glaucoma (increased eye pressure) 06/01/2018   Hyperlipidemia 06/01/2018   Hypertension 06/01/2018   Increased frequency of urination 04/30/2018   Overactive bladder 03/31/2016   Recurrent UTI 06/18/2015   Urge incontinence 05/22/2014    Orientation RESPIRATION BLADDER Height & Weight     Self, Situation, Place  Normal Continent Weight:   Height:     BEHAVIORAL SYMPTOMS/MOOD NEUROLOGICAL BOWEL NUTRITION STATUS      Continent Diet  AMBULATORY STATUS COMMUNICATION OF NEEDS Skin   Extensive Assist   Surgical wounds, Other (Comment) (drain)                       Personal Care Assistance Level of Assistance  Bathing, Feeding, Dressing, Total care Bathing Assistance: Limited assistance Feeding assistance: Limited assistance Dressing Assistance: Limited assistance Total Care Assistance: Limited assistance   Functional Limitations Info             SPECIAL CARE FACTORS FREQUENCY  PT (By licensed PT), OT (By licensed  OT)     PT Frequency: 5 times a week OT Frequency: 5 times a week            Contractures Contractures Info: Not present    Additional Factors Info  Code Status, Allergies Code Status Info: full Allergies Info: Nitrofurantoin Not Specified  Hives   Sulfa Antibiotics           Current Medications (10/03/2022):  This is the current hospital active medication list Current Facility-Administered Medications  Medication Dose Route Frequency Provider Last Rate Last Admin   acetaminophen (TYLENOL) tablet 650 mg  650 mg Oral Q6H PRN Carolan Shiver, MD       Or   acetaminophen (TYLENOL) suppository 650 mg  650 mg Rectal Q6H PRN Carolan Shiver, MD       docusate sodium (COLACE) capsule 100 mg  100 mg Oral BID PRN Carolan Shiver, MD       HYDROcodone-acetaminophen (NORCO/VICODIN) 5-325 MG per tablet 1-2 tablet  1-2 tablet Oral Q4H PRN Carolan Shiver, MD   2 tablet at 10/03/22 0414   losartan (COZAAR) tablet 50 mg  50 mg Oral Daily Carolan Shiver, MD   50 mg at 10/03/22 0950   melatonin tablet 2.5 mg  2.5 mg Oral QHS PRN Carolan Shiver, MD   2.5 mg at 10/02/22 2252   mirabegron ER (MYRBETRIQ) tablet 25 mg  25 mg Oral Daily Carolan Shiver, MD   25 mg at 10/03/22 0950   morphine (PF) 2 MG/ML injection 2 mg  2 mg Intravenous Q3H PRN Carolan Shiver, MD  2 mg at 10/02/22 2251   ondansetron (ZOFRAN-ODT) disintegrating tablet 4 mg  4 mg Oral Q6H PRN Carolan Shiver, MD       Or   ondansetron (ZOFRAN) injection 4 mg  4 mg Intravenous Q6H PRN Carolan Shiver, MD       pantoprazole (PROTONIX) EC tablet 40 mg  40 mg Oral BID Carolan Shiver, MD   40 mg at 10/03/22 0950   piperacillin-tazobactam (ZOSYN) IVPB 3.375 g  3.375 g Intravenous Q8H Carolan Shiver, MD 12.5 mL/hr at 10/03/22 0957 3.375 g at 10/03/22 0957   sodium chloride flush (NS) 0.9 % injection 10-40 mL  10-40 mL Intracatheter Q12H Carolan Shiver, MD   10  mL at 10/03/22 0959   sodium chloride flush (NS) 0.9 % injection 10-40 mL  10-40 mL Intracatheter PRN Carolan Shiver, MD       sodium chloride flush (NS) 0.9 % injection 5 mL  5 mL Intracatheter Q8H El-Abd, Aaron Edelman, MD   5 mL at 10/03/22 0649   sucralfate (CARAFATE) 1 GM/10ML suspension 1 g  1 g Oral TID WC & HS Carolan Shiver, MD   1 g at 10/03/22 4696     Discharge Medications: Please see discharge summary for a list of discharge medications.  Relevant Imaging Results:  Relevant Lab Results:   Additional Information SSN: [id number]  Kemper Durie, RN

## 2022-10-03 NOTE — Progress Notes (Addendum)
Mobility Specialist - Progress Note    10/03/22 1148  Mobility  Activity Ambulated with assistance in hallway  Level of Assistance Contact guard assist, steadying assist  Assistive Device Front wheel walker  Distance Ambulated (ft) 80 ft  Range of Motion/Exercises Active  Activity Response Tolerated well  Mobility Referral Yes  $Mobility charge 1 Mobility  Mobility Specialist Start Time (ACUTE ONLY) 1120  Mobility Specialist Stop Time (ACUTE ONLY) 1141  Mobility Specialist Time Calculation (min) (ACUTE ONLY) 21 min   Pt resting in bed on RA upon entry. Pt STS and ambulates to hallway around NS CGA with RW. Pt returned to bed and left with needs in reach. Pt bed alarm activated.   Felicia Hughes Mobility Specialist 10/03/22, 12:41 PM

## 2022-10-03 NOTE — Progress Notes (Signed)
Mobility Specialist - Progress Note    10/03/22 1200  Mobility  Activity Ambulated with assistance to bathroom  Level of Assistance Contact guard assist, steadying assist  Assistive Device Front wheel walker  Distance Ambulated (ft) 10 ft  Range of Motion/Exercises Active  Activity Response Tolerated well  Mobility Referral Yes  $Mobility charge 1 Mobility  Mobility Specialist Start Time (ACUTE ONLY) 1200  Mobility Specialist Stop Time (ACUTE ONLY) 1215  Mobility Specialist Time Calculation (min) (ACUTE ONLY) 15 min   Pt resting EOB upon entry. Pt STS and ambulates to bathroom CGA with RW. Pt returned to bed and left with needs in reach and bed alarm activated.   Johnathan Hausen Mobility Specialist 10/03/22, 12:59 PM

## 2022-10-03 NOTE — Progress Notes (Signed)
Patient ID: Felicia Hughes, female   DOB: March 02, 1934, 89 y.o.   MRN: [id number]     SURGICAL PROGRESS NOTE   Hospital Day(s): 3.   Interval History: Patient seen and examined, no acute events or new complaints overnight. Patient reports feeling better today.  She woke up feeling better this morning.  She was able to have better breakfast today.  She seems to be more comfortable today compared to yesterday.  Denies any nausea or vomiting.  Vital signs in last 24 hours: [min-max] current  Temp:  [98.3 F (36.8 C)-98.8 F (37.1 C)] 98.8 F (37.1 C) (07/13 0845) Pulse Rate:  [89-95] 89 (07/13 0845) Resp:  [15-20] 18 (07/13 0845) BP: (146-152)/(64-75) 147/70 (07/13 0845) SpO2:  [93 %-97 %] 96 % (07/13 0845)             Physical Exam:  Constitutional: alert, cooperative and no distress  Respiratory: breathing non-labored at rest  Cardiovascular: regular rate and sinus rhythm  Gastrointestinal: soft, non-tender, and non-distended.  Right upper quadrant drain in place.  Labs:     Latest Ref Rng & Units 10/02/2022    8:37 AM 09/15/2022    5:13 AM 09/14/2022    7:54 AM  CBC  WBC 4.0 - 10.5 K/uL 13.7  14.3  27.4   Hemoglobin 12.0 - 15.0 g/dL 40.9  81.1  91.4   Hematocrit 36.0 - 46.0 % 32.7  37.6  41.8   Platelets 150 - 400 K/uL 631  442  530       Latest Ref Rng & Units 10/02/2022    8:37 AM 09/21/2022    4:36 AM 09/20/2022   10:55 AM  CMP  Glucose 70 - 99 mg/dL 84  98  84   BUN 8 - 23 mg/dL 10  16  15    Creatinine 0.44 - 1.00 mg/dL 7.82  9.56  2.13   Sodium 135 - 145 mmol/L 140  140  137   Potassium 3.5 - 5.1 mmol/L 3.4  3.5  2.2   Chloride 98 - 111 mmol/L 108  103  102   CO2 22 - 32 mmol/L 23  25  25    Calcium 8.9 - 10.3 mg/dL 8.0  7.8  7.9     Imaging studies: No new pertinent imaging studies   Assessment/Plan:  89 y.o. female with intra-abdominal abscess status post percutaneous drainage, complicated by pertinent comorbidities including recent duodenal perforation.   -Continue  with stable vital signs -Seems to be more comfortable and improved pain today -Will continue with IV antibiotic therapy -Continue with drain in place -Encouraged patient to ambulate -Continue pain management -Plan is to repeat CT scan on Monday morning.  If abscess has resolved we will plan to discharge to skilled nursing facility.  Gae Gallop, MD

## 2022-10-03 NOTE — TOC Progression Note (Signed)
Transition of Care [facility]) - Progression Note    Patient Details  Name: Felicia Hughes MRN: [id number] Date of Birth: February 25, 1934  [facility]) CM/SW Contact  Kemper Durie, RN Phone Number: 10/03/2022, 10:41 AM  Clinical Narrative:     Confirmed with MD plan is to discharge to SNF on Monday.  New FL2 completed, auth initiated.  Auth ID [id number].  Expected Discharge Plan: Skilled Nursing Facility    Expected Discharge Plan and Services                                               Social Determinants of Health (SDOH) Interventions SDOH Screenings   Food Insecurity: No Food Insecurity (09/14/2022)  Housing: Low Risk  (09/14/2022)  Transportation Needs: No Transportation Needs (09/14/2022)  Utilities: Not At Risk (09/14/2022)  Financial Resource Strain: Low Risk  (06/29/2022)   Received from [facility], [facility]  Tobacco Use: Low Risk  (10/01/2022)    Readmission Risk Interventions    09/05/2022   11:39 AM  Readmission Risk Prevention Plan  Post Dischage Appt Complete  Medication Screening Complete  Transportation Screening Complete

## 2022-10-04 LAB — CBC WITH DIFFERENTIAL/PLATELET
Abs Immature Granulocytes: 0.05 10*3/uL (ref 0.00–0.07)
Basophils Absolute: 0.1 10*3/uL (ref 0.0–0.1)
Basophils Relative: 1 %
Eosinophils Absolute: 0.2 10*3/uL (ref 0.0–0.5)
Eosinophils Relative: 2 %
HCT: 33.8 % — ABNORMAL LOW (ref 36.0–46.0)
Hemoglobin: 11.1 g/dL — ABNORMAL LOW (ref 12.0–15.0)
Immature Granulocytes: 0 %
Lymphocytes Relative: 19 %
Lymphs Abs: 2.2 10*3/uL (ref 0.7–4.0)
MCH: 32.5 pg (ref 26.0–34.0)
MCHC: 32.8 g/dL (ref 30.0–36.0)
MCV: 98.8 fL (ref 80.0–100.0)
Monocytes Absolute: 1.3 10*3/uL — ABNORMAL HIGH (ref 0.1–1.0)
Monocytes Relative: 12 %
Neutro Abs: 7.6 10*3/uL (ref 1.7–7.7)
Neutrophils Relative %: 66 %
Platelets: 612 10*3/uL — ABNORMAL HIGH (ref 150–400)
RBC: 3.42 MIL/uL — ABNORMAL LOW (ref 3.87–5.11)
RDW: 13.1 % (ref 11.5–15.5)
WBC: 11.5 10*3/uL — ABNORMAL HIGH (ref 4.0–10.5)
nRBC: 0 % (ref 0.0–0.2)

## 2022-10-04 LAB — BASIC METABOLIC PANEL
Anion gap: 7 (ref 5–15)
BUN: 12 mg/dL (ref 8–23)
CO2: 25 mmol/L (ref 22–32)
Calcium: 8.2 mg/dL — ABNORMAL LOW (ref 8.9–10.3)
Chloride: 105 mmol/L (ref 98–111)
Creatinine, Ser: 0.71 mg/dL (ref 0.44–1.00)
GFR, Estimated: >60 mL/min (ref >60)
Glucose, Bld: 142 mg/dL — ABNORMAL HIGH (ref 70–99)
Potassium: 3 mmol/L — ABNORMAL LOW (ref 3.5–5.1)
Sodium: 137 mmol/L (ref 135–145)

## 2022-10-04 MED ORDER — HYDROCODONE-ACETAMINOPHEN 5-325 MG PO TABS
1.0000 | ORAL_TABLET | ORAL | Status: DC | PRN
  Administered 2022-10-04 – 2022-10-07 (×8): 1 via ORAL
  Filled 2022-10-04 (×8): qty 1

## 2022-10-04 NOTE — TOC Progression Note (Signed)
Transition of Care [facility]) - Progression Note    Patient Details  Name: Felicia Hughes MRN: [id number] Date of Birth: 1933-08-22  Transition of Care [facility]) CM/SW Contact  Kemper Durie, RN Phone Number: 10/04/2022, 9:28 AM  Clinical Narrative:     Auth received, approved through 7/17.  MD aware, plan for discharge on Monday.  Plan [facility] ID# [id number]  Expected Discharge Plan: Skilled Nursing Facility    Expected Discharge Plan and Services                                               Social Determinants of Health (SDOH) Interventions SDOH Screenings   Food Insecurity: No Food Insecurity (09/14/2022)  Housing: Low Risk  (09/14/2022)  Transportation Needs: No Transportation Needs (09/14/2022)  Utilities: Not At Risk (09/14/2022)  Financial Resource Strain: Low Risk  (06/29/2022)   Received from [facility], [facility]  Tobacco Use: Low Risk  (10/01/2022)    Readmission Risk Interventions    09/05/2022   11:39 AM  Readmission Risk Prevention Plan  Post Dischage Appt Complete  Medication Screening Complete  Transportation Screening Complete

## 2022-10-04 NOTE — Progress Notes (Signed)
Patient ID: Felicia Hughes, female   DOB: Jun 02, 1933, 89 y.o.   MRN: [id number]     SURGICAL PROGRESS NOTE   Hospital Day(s): 4.   Interval History: Patient seen and examined, no acute events or new complaints overnight. Patient reports she started having pain at 5 AM.  Pain in the right side of the abdomen.  No nausea or vomiting.  She tolerated diet yesterday.  Has been no fever.  Vital signs in last 24 hours: [min-max] current  Temp:  [98.2 F (36.8 C)-99.2 F (37.3 C)] 98.2 F (36.8 C) (07/14 0806) Pulse Rate:  [91-99] 92 (07/14 0806) Resp:  [16-18] 18 (07/14 0806) BP: (120-150)/(56-71) 136/71 (07/14 0806) SpO2:  [95 %-97 %] 95 % (07/14 0806)             Physical Exam:  Constitutional: alert, cooperative and no distress  Respiratory: breathing non-labored at rest  Cardiovascular: regular rate and sinus rhythm  Gastrointestinal: soft, non-tender, and non-distended  Labs:     Latest Ref Rng & Units 10/02/2022    8:37 AM 09/15/2022    5:13 AM 09/14/2022    7:54 AM  CBC  WBC 4.0 - 10.5 K/uL 13.7  14.3  27.4   Hemoglobin 12.0 - 15.0 g/dL 78.2  95.6  21.3   Hematocrit 36.0 - 46.0 % 32.7  37.6  41.8   Platelets 150 - 400 K/uL 631  442  530       Latest Ref Rng & Units 10/02/2022    8:37 AM 09/21/2022    4:36 AM 09/20/2022   10:55 AM  CMP  Glucose 70 - 99 mg/dL 84  98  84   BUN 8 - 23 mg/dL 10  16  15    Creatinine 0.44 - 1.00 mg/dL 0.86  5.78  4.69   Sodium 135 - 145 mmol/L 140  140  137   Potassium 3.5 - 5.1 mmol/L 3.4  3.5  2.2   Chloride 98 - 111 mmol/L 108  103  102   CO2 22 - 32 mmol/L 23  25  25    Calcium 8.9 - 10.3 mg/dL 8.0  7.8  7.9     Imaging studies: No new pertinent imaging studies   Assessment/Plan:  89 y.o. female with intra-abdominal abscess status post percutaneous drainage, complicated by pertinent comorbidities including recent duodenal perforation.   -Continue with intermittent pain.  Continue with pain management. -Continue IV antibiotic therapy -Will  repeat labs in the morning and CT scan of the abdomen and pelvis.  Hopefully she will be able to go to skilled nursing facility if adequate resolution of the abscess is seen on the CT scan.  Gae Gallop, MD

## 2022-10-05 ENCOUNTER — Inpatient Hospital Stay: Payer: Medicare Other

## 2022-10-05 ENCOUNTER — Encounter: Payer: Self-pay | Admitting: General Surgery

## 2022-10-05 MED ORDER — IOHEXOL 300 MG/ML  SOLN
75.0000 mL | Freq: Once | INTRAMUSCULAR | Status: AC | PRN
  Administered 2022-10-05: 75 mL via INTRAVENOUS

## 2022-10-05 MED ORDER — IOHEXOL 9 MG/ML PO SOLN
500.0000 mL | ORAL | Status: AC
  Administered 2022-10-05 (×2): 500 mL

## 2022-10-05 NOTE — Care Management Important Message (Signed)
Important Message  Patient Details  Name: Felicia Hughes MRN: [id number] Date of Birth: [birth year]   Medicare Important Message Given:  N/A - LOS <3 / Initial given by admissions     Johnell Comings 10/05/2022, 8:21 AM

## 2022-10-05 NOTE — Plan of Care (Signed)
JP drain output being monitored and flushed. Son at the bedside. Surgin and son have spoke about the patient's care plan today.  Problem: Education: Goal: Knowledge of General Education information will improve Description: Including pain rating scale, medication(s)/side effects and non-pharmacologic comfort measures Outcome: Progressing   Problem: Health Behavior/Discharge Planning: Goal: Ability to manage health-related needs will improve Outcome: Progressing   Problem: Clinical Measurements: Goal: Ability to maintain clinical measurements within normal limits will improve Outcome: Progressing Goal: Will remain free from infection Outcome: Progressing Goal: Diagnostic test results will improve Outcome: Progressing Goal: Respiratory complications will improve Outcome: Progressing Goal: Cardiovascular complication will be avoided Outcome: Progressing   Problem: Activity: Goal: Risk for activity intolerance will decrease Outcome: Progressing   Problem: Nutrition: Goal: Adequate nutrition will be maintained Outcome: Progressing   Problem: Coping: Goal: Level of anxiety will decrease Outcome: Progressing   Problem: Elimination: Goal: Will not experience complications related to bowel motility Outcome: Progressing Goal: Will not experience complications related to urinary retention Outcome: Progressing   Problem: Pain Managment: Goal: General experience of comfort will improve Outcome: Progressing   Problem: Safety: Goal: Ability to remain free from injury will improve Outcome: Progressing   Problem: Skin Integrity: Goal: Risk for impaired skin integrity will decrease Outcome: Progressing

## 2022-10-05 NOTE — Progress Notes (Signed)
Occupational Therapy Treatment Patient Details Name: Felicia Hughes MRN: [id number] DOB: Aug 13, 1933 Today's Date: 10/05/2022   History of present illness 89 y.o female with significant PMH of CKD, GERD, HLD, HTN, s/p cholecystectomy, duodenal perforation s/p repair on 6/24 and discharged to rehab. Pt now readmitted for intra-abdominal abscess, s/p drain placement on 7/11.   OT comments  Pt seen for OT treatment on this date. Upon arrival to room pt seated in recliner, agreeable to OT Tx session. Son present initially, but leaving shortly after OT arrival. OT facilitated ADL management as described below. See ADL section for additional details regarding occupational performance. Pt continues to be functionally limited by generalized weakness, decreased tolerance to activity, pain limiting full performance in ADL routines. RN notified due to pt's c/o pain and NT entering room at end of tx. Overall supervision for standing level ADL tasks and toileting routine. Pt return verbalizes understanding of education provided t/o session. Pt is progressing toward OT goals and continues to benefit from skilled OT services to maximize return to PLOF and minimize risk of future falls, injury, caregiver burden, and readmission. Will continue to follow POC as written. Discharge recommendation remains appropriate.     Recommendations for follow up therapy are one component of a multi-disciplinary discharge planning process, led by the attending physician.  Recommendations may be updated based on patient status, additional functional criteria and insurance authorization.    Assistance Recommended at Discharge Frequent or constant Supervision/Assistance  Patient can return home with the following  A little help with walking and/or transfers;Assistance with cooking/housework;Help with stairs or ramp for entrance;Assist for transportation;Direct supervision/assist for medications management;A little help with  bathing/dressing/bathroom         Precautions / Restrictions Precautions Precautions: Fall Precaution Comments: abdominal incisions, RLQ JP bulb drain Restrictions Weight Bearing Restrictions: No       Mobility Bed Mobility Overal bed mobility: Needs Assistance Bed Mobility: Sit to Supine       Sit to supine: Supervision   General bed mobility comments: CGA for BLE mgmt to come from seated to supine in bed, increased time with effortful movements d/t pain    Transfers Overall transfer level: Needs assistance Equipment used: Rolling walker (2 wheels) Transfers: Sit to/from Stand Sit to Stand: Supervision                 Balance Overall balance assessment: Needs assistance Sitting-balance support: Feet unsupported, No upper extremity supported Sitting balance-Leahy Scale: Good     Standing balance support: No upper extremity supported Standing balance-Leahy Scale: Good Standing balance comment: Stands for grooming task for ~2 mins                           ADL either performed or assessed with clinical judgement   ADL Overall ADL's : Needs assistance/impaired     Grooming: Standing;Supervision/safety;Wash/dry hands;Wash/dry Scientist, research (physical sciences): Solicitor;Ambulation;Rolling walker (2 wheels);Grab bars Toilet Transfer Details (indicate cue type and reason): VC for RW safety Toileting- Clothing Manipulation and Hygiene: Supervision/safety;Sitting/lateral lean       Functional mobility during ADLs: Min guard;Rolling walker (2 wheels) General ADL Comments: Performance of standing grooming task at sink, then uses RW to mobilize to bathroom for urine void. Supervision overall      Cognition Arousal/Alertness: Awake/alert Behavior During Therapy: WFL for tasks assessed/performed Overall Cognitive Status: Within  Functional Limits for tasks assessed                                                      Pertinent Vitals/ Pain       Pain Assessment Pain Assessment: 0-10 Pain Score: 6  Pain Descriptors / Indicators: Grimacing, Aching Pain Intervention(s): Limited activity within patient's tolerance, Patient requesting pain meds-RN notified   Frequency  Min 1X/week        Progress Toward Goals  OT Goals(current goals can now be found in the care plan section)  Progress towards OT goals: Progressing toward goals  Acute Rehab OT Goals OT Goal Formulation: With patient Time For Goal Achievement: 10/16/22 Potential to Achieve Goals: Good  Plan Frequency remains appropriate;Discharge plan remains appropriate       AM-PAC OT "6 Clicks" Daily Activity     Outcome Measure   Help from another person eating meals?: None Help from another person taking care of personal grooming?: A Little Help from another person toileting, which includes using toliet, bedpan, or urinal?: A Little Help from another person bathing (including washing, rinsing, drying)?: A Little Help from another person to put on and taking off regular upper body clothing?: A Little Help from another person to put on and taking off regular lower body clothing?: A Little 6 Click Score: 19    End of Session Equipment Utilized During Treatment: Rolling walker (2 wheels)  OT Visit Diagnosis: Other abnormalities of gait and mobility (R26.89);Pain   Activity Tolerance Patient tolerated treatment well   Patient Left in bed;with call bell/phone within reach;with bed alarm set   Nurse Communication Patient requests pain meds;Mobility status (NT aware of pt's position and void)        Time: 1610-9604 OT Time Calculation (min): 27 min  Charges: OT General Charges $OT Visit: 1 Visit OT Treatments $Self Care/Home Management : 23-37 mins  Wisdom Rickey L. Isis Costanza, OTR/L  10/05/22, 4:03 PM

## 2022-10-05 NOTE — TOC Progression Note (Signed)
Transition of Care [facility]) - Progression Note    Patient Details  Name: Felicia Hughes MRN: [id number] Date of Birth: 02/11/1934  Transition of [facility]) CM/SW Contact  Chapman Fitch, RN Phone Number: 10/05/2022, 1:54 PM  Clinical Narrative:    Per MD anticipated patient will medically ready for discharge tomorrow. Kim at [employer]. Per Selena Batten they can provide [employer] transport    Expected Discharge Plan: Skilled Nursing Facility    Expected Discharge Plan and Services                                               Social Determinants of Health (SDOH) Interventions SDOH Screenings   Food Insecurity: No Food Insecurity (09/14/2022)  Housing: Low Risk  (09/14/2022)  Transportation Needs: No Transportation Needs (09/14/2022)  Utilities: Not At Risk (09/14/2022)  Financial Resource Strain: Low Risk  (06/29/2022)   Received from [facility], [facility]  Tobacco Use: Low Risk  (10/01/2022)    Readmission Risk Interventions    09/05/2022   11:39 AM  Readmission Risk Prevention Plan  Post Dischage Appt Complete  Medication Screening Complete  Transportation Screening Complete

## 2022-10-05 NOTE — Progress Notes (Signed)
Physical Therapy Treatment Patient Details Name: Felicia Hughes MRN: [id number] DOB: 1933/12/03 Today's Date: 10/05/2022   History of Present Illness 89 y.o female with significant PMH of CKD, GERD, HLD, HTN, s/p cholecystectomy, duodenal perforation s/p repair on 6/24 and discharged to rehab. Pt now readmitted for intra-abdominal abscess, s/p drain placement on 7/11.    PT Comments  Pt pleasant and motivated for therapy. She was able to perform log-roll in order to sit EOB with only supervision assist for cueing at start of session. Pt was min guard for STS from bed for safety but needed no physical assistance or cueing. Pt able to walk ~5 feet to the bathroom with RW and min guard; she demonstrated good eccentric/concentric control getting down/up from standard toilet height. Pt then able to ambulate 8 x 10 feet in the room with no instances of LOB. Pt limited to room today secondary to enteric precautions. Pt returned to recliner at end of session with all needs in reach. Pt will benefit from continued PT services upon discharge to safely address deficits listed in patient problem list for decreased caregiver assistance and eventual return to PLOF.     Assistance Recommended at Discharge Intermittent Supervision/Assistance  If plan is discharge home, recommend the following:  Can travel by private vehicle    A little help with walking and/or transfers;A little help with bathing/dressing/bathroom;Assistance with cooking/housework;Assist for transportation;Help with stairs or ramp for entrance      Equipment Recommendations  Other (comment) (TBD)    Recommendations for Other Services       Precautions / Restrictions Precautions Precautions: Fall Precaution Comments: abdominal incisions, RLQ JP bulb drain Restrictions Weight Bearing Restrictions: No     Mobility  Bed Mobility Overal bed mobility: Needs Assistance Bed Mobility: Rolling, Sidelying to Sit Rolling:  Supervision Sidelying to sit: Supervision       General bed mobility comments: Pt able to roll to L side with cueing for UE/LE placement but no physical assistance needed. Supervision for sidelying-sit for UE/placement and use of LEs for momentum, no physical assist needed.    Transfers Overall transfer level: Needs assistance Equipment used: Rolling walker (2 wheels) Transfers: Sit to/from Stand Sit to Stand: Min guard           General transfer comment: Pt able to stand from regular bed height with no physical assistance or cueing required. Min guard for safety. Pt able to sit/stand from standard toilet height with no physical assistance or cueing.    Ambulation/Gait Ambulation/Gait assistance: Min guard Gait Distance (Feet): 1 x 5, 8 x 10 Feet Assistive device: Rolling walker (2 wheels) Gait Pattern/deviations: Step-through pattern, Decreased step length - left, Decreased step length - right, Trunk flexed Gait velocity: decreased     General Gait Details: Pt able to ambulate to bathroom, then ~80 feet in room (8 x 10 feet) with RW and min guard for safety. No instances of LOB.   Stairs             Wheelchair Mobility     Tilt Bed    Modified Rankin (Stroke Patients Only)       Balance Overall balance assessment: Needs assistance Sitting-balance support: Feet unsupported, No upper extremity supported Sitting balance-Leahy Scale: Good     Standing balance support: No upper extremity supported Standing balance-Leahy Scale: Good Standing balance comment: Pt able to stand with no UE support for 20-30 seconds and no LOB while taking meds by sink  Cognition Arousal/Alertness: Awake/alert Behavior During Therapy: WFL for tasks assessed/performed Overall Cognitive Status: Within Functional Limits for tasks assessed                                          Exercises      General Comments         Pertinent Vitals/Pain Pain Assessment Pain Assessment: No/denies pain    Home Living                          Prior Function            PT Goals (current goals can now be found in the care plan section) Progress towards PT goals: Progressing toward goals    Frequency    Min 1X/week      PT Plan Current plan remains appropriate    Co-evaluation              AM-PAC PT "6 Clicks" Mobility   Outcome Measure  Help needed turning from your back to your side while in a flat bed without using bedrails?: None Help needed moving from lying on your back to sitting on the side of a flat bed without using bedrails?: A Little Help needed moving to and from a bed to a chair (including a wheelchair)?: A Little Help needed standing up from a chair using your arms (e.g., wheelchair or bedside chair)?: None Help needed to walk in hospital room?: A Little Help needed climbing 3-5 steps with a railing? : A Lot 6 Click Score: 19    End of Session   Activity Tolerance: Patient tolerated treatment well Patient left: in chair;with call bell/phone within reach;with chair alarm set Nurse Communication: Mobility status PT Visit Diagnosis: History of falling (Z91.81);Pain;Muscle weakness (generalized) (M62.81);Unsteadiness on feet (R26.81);Difficulty in walking, not elsewhere classified (R26.2)     Time: 4742-5956 PT Time Calculation (min) (ACUTE ONLY): 23 min  Charges:                            Claramae Rigdon, SPT 10/05/22, 4:40 PM

## 2022-10-05 NOTE — TOC Progression Note (Signed)
Transition of Care [facility]) - Progression Note    Patient Details  Name: Felicia Hughes MRN: [id number] Date of Birth: [birth year]  Transition of Care [facility] CM/SW Contact  Chapman Fitch, RN Phone Number: 10/05/2022, 11:16 AM  Clinical Narrative:     Message sent to MD to determine if patient is medically ready for discharge to SNF  Expected Discharge Plan: Skilled Nursing Facility    Expected Discharge Plan and Services                                               Social Determinants of Health (SDOH) Interventions SDOH Screenings   Food Insecurity: No Food Insecurity (09/14/2022)  Housing: Low Risk  (09/14/2022)  Transportation Needs: No Transportation Needs (09/14/2022)  Utilities: Not At Risk (09/14/2022)  Financial Resource Strain: Low Risk  (06/29/2022)   Received from [facility], [facility]  Tobacco Use: Low Risk  (10/01/2022)    Readmission Risk Interventions    09/05/2022   11:39 AM  Readmission Risk Prevention Plan  Post Dischage Appt Complete  Medication Screening Complete  Transportation Screening Complete

## 2022-10-05 NOTE — Progress Notes (Signed)
Patient ID: Felicia Hughes, female   DOB: [birth year], 89 y.o.   MRN: [id number]     SURGICAL PROGRESS NOTE   Hospital Day(s): 4.   Interval History: Patient seen and examined, no acute events or new complaints overnight. Patient reports still feeling uncomfortable.  She endorses that is not getting difficult for her to get out of bed because of pain in the abdomen.  She is tolerating diet but eating small amounts.  She mainly drinks Ensure.  No nausea or vomiting.  Patient having multiple loose bowel movement.  Vital signs in last 24 hours: [min-max] current  Temp:  [98.3 F (36.8 C)-98.5 F (36.9 C)] 98.5 F (36.9 C) (07/15 1553) Pulse Rate:  [85-93] 93 (07/15 1553) Resp:  [16-18] 18 (07/15 1553) BP: (139-146)/(59-69) 140/59 (07/15 1553) SpO2:  [96 %-99 %] 97 % (07/15 1553)             Physical Exam:  Constitutional: alert, cooperative and no distress  Respiratory: breathing non-labored at rest  Cardiovascular: regular rate and sinus rhythm  Gastrointestinal: soft, mild-tender, and non-distended  Labs:     Latest Ref Rng & Units 10/04/2022    5:20 PM 10/02/2022    8:37 AM 09/15/2022    5:13 AM  CBC  WBC 4.0 - 10.5 K/uL 11.5  13.7  14.3   Hemoglobin 12.0 - 15.0 g/dL 54.2  70.6  23.7   Hematocrit 36.0 - 46.0 % 33.8  32.7  37.6   Platelets 150 - 400 K/uL 612  631  442       Latest Ref Rng & Units 10/04/2022    5:20 PM 10/02/2022    8:37 AM 09/21/2022    4:36 AM  CMP  Glucose 70 - 99 mg/dL 628  84  98   BUN 8 - 23 mg/dL 12  10  16    Creatinine 0.44 - 1.00 mg/dL 3.15  1.76  1.60   Sodium 135 - 145 mmol/L 137  140  140   Potassium 3.5 - 5.1 mmol/L 3.0  3.4  3.5   Chloride 98 - 111 mmol/L 105  108  103   CO2 22 - 32 mmol/L 25  23  25    Calcium 8.9 - 10.3 mg/dL 8.2  8.0  7.8     Imaging studies: No new pertinent imaging studies   Assessment/Plan:  89 y.o. female with intra-abdominal abscess status post percutaneous drainage, complicated by pertinent comorbidities including recent  duodenal perforation.   -Patient with improved white blood cell count -Stable vital signs.  No fever -CT scan of the abdomen and pelvis shows significant improvement of the intra-abdominal abscess down to 2 cm.  Drain in place.  I personally evaluated the images and discussed them with the interventional radiologist due to common of the reading radiologist about repositioning of the catheter.  Since the drain still having output, will avoid any additional procedure at this moment.  Will continue with initial plan of repeating CT scan about 2 weeks as outpatient with interrogation of the drain and abscess cavity.  If the abscess to resolve the drain will be able to be taken out or he can be exchanged for a position at the moment. -Regarding the chronic pain and loss of appetite.  As per discussion with the son that was at bedside today the decrease in oral intake has been a chronic issue even before the surgeries.  I do not have a good explanation of why patient continue having abdominal pain other  than multiple abdominal surgeries recently but there is no current complications.  I will have expected the pain to improve with the significant improvement of the abscess on the right side. -Will continue with current management -Plan is for discharge tomorrow to skilled nursing facility if no clinical duration.  Gae Gallop, MD

## 2022-10-05 NOTE — Progress Notes (Signed)
Stool sample needed for C. Diff

## 2022-10-06 DIAGNOSIS — Z7189 Other specified counseling: Secondary | ICD-10-CM | POA: Diagnosis not present

## 2022-10-06 DIAGNOSIS — K651 Peritoneal abscess: Secondary | ICD-10-CM

## 2022-10-06 LAB — AEROBIC/ANAEROBIC CULTURE W GRAM STAIN (SURGICAL/DEEP WOUND)

## 2022-10-06 LAB — C DIFFICILE (CDIFF) QUICK SCRN (NO PCR REFLEX)
C Diff antigen: NEGATIVE
C Diff interpretation: NOT DETECTED
C Diff toxin: NEGATIVE

## 2022-10-06 MED ORDER — LOPERAMIDE HCL 2 MG PO CAPS
4.0000 mg | ORAL_CAPSULE | ORAL | Status: DC | PRN
  Administered 2022-10-06: 4 mg via ORAL
  Filled 2022-10-06: qty 2

## 2022-10-06 NOTE — Progress Notes (Signed)
OT Cancellation Note  Patient Details Name: Felicia Hughes MRN: [id number] DOB: 13-Apr-1933   Cancelled Treatment:    Reason Eval/Treat Not Completed: Patient declined, no reason specified. Pt in bed, politely declining therapy participation at this time d/t fatigue. States she is tired, and fears her abdomen might begin to hurt with mobility. Will defer OT this date, and re-check with pt tomorrow as team able.  [name], OTR/L  10/06/22, 4:09 PM

## 2022-10-06 NOTE — Plan of Care (Signed)

## 2022-10-06 NOTE — Progress Notes (Signed)
Physical Therapy Treatment Patient Details Name: Felicia Hughes MRN: [id number] DOB: 05/25/1933 Today's Date: 10/06/2022   History of Present Illness 89 y.o female with significant PMH of CKD, GERD, HLD, HTN, s/p cholecystectomy, duodenal perforation s/p repair on 6/24 and discharged to rehab. Pt now readmitted for intra-abdominal abscess, s/p drain placement on 7/11.    PT Comments  Pt pleasant and lying in bed upon arrival. She refused any sort of mobility secondary to feeling tired and cold but was agreeable to performing some bed therex. Pt able to perform exercises with no adverse responses and put forth good effort. Pt educated at end of session on importance of mobility and getting out of bed. She reported she would be more willing to try and walk tomorrow. Pt will benefit from continued PT services upon discharge to safely address deficits listed in patient problem list for decreased caregiver assistance and eventual return to PLOF.     Assistance Recommended at Discharge Intermittent Supervision/Assistance  If plan is discharge home, recommend the following:  Can travel by private vehicle    A little help with walking and/or transfers;A little help with bathing/dressing/bathroom;Assistance with cooking/housework;Assist for transportation;Help with stairs or ramp for entrance      Equipment Recommendations  Other (comment) (TBD)    Recommendations for Other Services       Precautions / Restrictions Precautions Precautions: Fall Precaution Comments: abdominal incisions, RLQ JP bulb drain Restrictions Weight Bearing Restrictions: No     Mobility  Bed Mobility               General bed mobility comments: NT - pt deferred mobility    Transfers                        Ambulation/Gait                   Stairs             Wheelchair Mobility     Tilt Bed    Modified Rankin (Stroke Patients Only)       Balance                                             Cognition Arousal/Alertness: Awake/alert Behavior During Therapy: WFL for tasks assessed/performed Overall Cognitive Status: Within Functional Limits for tasks assessed                                          Exercises Total Joint Exercises Ankle Circles/Pumps: AROM, Strengthening, Both, 2 x 10 reps, Supine (With light manual resistance) Quad Sets: Strengthening, Both, 2 x 10 reps, Supine Gluteal Sets: Strengthening, Both, 2 x 10 reps, Supine Heel Slides: AROM, Strengthening, Both, 2 x 10 reps, Supine (With light manual resistance) Hip ABduction/ADduction: AROM, Both, 2 x 10 reps, Supine Straight Leg Raises: AROM, Strengthening, Both, 2 x 10 reps, Supine Other Exercises Other Exercises: Education on physiological importance of mobility, getting OOB    General Comments        Pertinent Vitals/Pain Pain Assessment Pain Assessment: No/denies pain Pain Location: Abdomen pain with mobility, none at rest.    Home Living  Prior Function            PT Goals (current goals can now be found in the care plan section) Progress towards PT goals: Not progressing toward goals - comment (Pt deferred mobility secondary to fatigue)    Frequency    Min 1X/week      PT Plan Current plan remains appropriate    Co-evaluation              AM-PAC PT "6 Clicks" Mobility   Outcome Measure  Help needed turning from your back to your side while in a flat bed without using bedrails?: None Help needed moving from lying on your back to sitting on the side of a flat bed without using bedrails?: A Little Help needed moving to and from a bed to a chair (including a wheelchair)?: A Little Help needed standing up from a chair using your arms (e.g., wheelchair or bedside chair)?: None Help needed to walk in hospital room?: A Little Help needed climbing 3-5 steps with a railing? : A Lot 6 Click  Score: 19    End of Session   Activity Tolerance: Patient tolerated treatment well Patient left: in bed;with family/visitor present;with call bell/phone within reach;with bed alarm set Nurse Communication: Mobility status PT Visit Diagnosis: History of falling (Z91.81);Pain;Muscle weakness (generalized) (M62.81);Unsteadiness on feet (R26.81);Difficulty in walking, not elsewhere classified (R26.2)     Time: 1884-1660 PT Time Calculation (min) (ACUTE ONLY): 24 min  Charges:                            Jenissa Tyrell, SPT 10/06/22, 5:00 PM

## 2022-10-06 NOTE — Consult Note (Addendum)
Consultation Note Date: 10/07/2022   Patient Name: Felicia Hughes  DOB: [birth year]  MRN: [id number]  Age / Sex: 89 y.o., female  PCP: Barbette Reichmann, MD Referring Physician: Carolan Shiver, MD  Reason for Consultation: Establishing goals of care  HPI/Patient Profile: Per H&P,  Felicia Hughes presents for a due to intra-abdominal abscess.   Patient recently admitted with duodenal ulcer perforation.  She underwent exploratory laparotomy with repair of duodenal ulcer and Graham patch.  She recovered well.  Upper GI done prior to discharge negative for leak.  She was discharged to skilled nursing facility for rehab.   Patient endorses that she has been feeling bad.  She endorses generalized abdominal pain.  She endorses decreased appetite.  She has also been depressed.  No nausea or vomiting.  Passing gas and having bowel movement.  She is on Protonix twice daily.  She has C. difficile done at the skilled nursing facility that was negative.  Clinical Assessment and Goals of Care: Notes and labs reviewed. In to see patient. She is resting in bed with son at bedside. Upon trying to interact with her she states she is unable to participate in conversation as she is not feeling well but is unable to explain.   Son discusses that she has already discussed a desire to discharge with hospice care, and they would like more information on the service.   He has been kept well updated and discusses her diagnosis, and discusses the past month with issues and decline, prior to this admission. He states he has been alternating with his sister being here to assist with his mother.  We discussed hospice and comfort measures vs continued aggressive care.  He would like his mother to return to her current facility if possible with hospice to follow their. He will call the facility as he has questions for them regarding her  care moving forward, and options.    Patient again states she feels poorly, and discusses abdominal pain; as per [facility], pain medication is currently available, nurse made aware of pain.     SUMMARY OF RECOMMENDATIONS   PMT will follow.   Prognosis:  < 6 months       Primary Diagnoses: Present on Admission:  Intra-abdominal abscess (HCC)   I have reviewed the medical record, interviewed the patient and family, and examined the patient. The following aspects are pertinent.  Past Medical History:  Diagnosis Date   Arrhythmia    Chronic kidney disease    UTI   GERD (gastroesophageal reflux disease)    Glaucoma (increased eye pressure)    Hypercholesterolemia    Hypertension    Social History   Socioeconomic History   Marital status: Single    Spouse name: Not on file   Number of children: Not on file   Years of education: Not on file   Highest education level: Not on file  Occupational History   Not on file  Tobacco Use   Smoking status: Never   Smokeless tobacco: Never  Vaping  Use   Vaping status: Never Used  Substance and Sexual Activity   Alcohol use: Yes    Alcohol/week: 1.0 standard drink of alcohol    Types: 1 Glasses of wine per week    Comment: wine daily   Drug use: No   Sexual activity: Yes    Birth control/protection: None  Other Topics Concern   Not on file  Social History Narrative   Not on file   Social Determinants of Health   Financial Resource Strain: Low Risk  (06/29/2022)   Received from [facility], [facility]   Overall Financial Resource Strain (CARDIA)    Difficulty of Paying Living Expenses: Not hard at all  Food Insecurity: No Food Insecurity (09/14/2022)   Hunger Vital Sign    Worried About Running Out of Food in the Last Year: Never true    Ran Out of Food in the Last Year: Never true  Transportation Needs: No Transportation Needs (09/14/2022)   PRAPARE - Scientist, research (physical sciences) (Medical): No    Lack of Transportation (Non-Medical): No  Physical Activity: Not on file  Stress: Not on file  Social Connections: Not on file   Family History  Problem Relation Age of Onset   Breast cancer Mother 26       early 41's   Kidney cancer Neg Hx    Kidney disease Neg Hx    Prostate cancer Neg Hx    Bladder Cancer Neg Hx    Scheduled Meds:  losartan  50 mg Oral Daily   mirabegron ER  25 mg Oral Daily   pantoprazole  40 mg Oral BID   sodium chloride flush  10-40 mL Intracatheter Q12H   sodium chloride flush  5 mL Intracatheter Q8H   sucralfate  1 g Oral TID WC & HS   Continuous Infusions:  piperacillin-tazobactam (ZOSYN)  IV 12.5 mL/hr at 10/07/22 0359   PRN Meds:.acetaminophen **OR** acetaminophen, docusate sodium, HYDROcodone-acetaminophen, loperamide, melatonin, morphine injection, ondansetron **OR** ondansetron (ZOFRAN) IV, sodium chloride flush Medications Prior to Admission:  Prior to Admission medications   Medication Sig Start Date End Date Taking? Authorizing Provider  atorvastatin (LIPITOR) 20 MG tablet Take 20 mg by mouth daily at 6 PM.   Yes [provider]  cholecalciferol (VITAMIN D) 1000 units tablet Take 1,000 Units by mouth daily.   Yes [provider]  losartan (COZAAR) 50 MG tablet Take 1 tablet (50 mg total) by mouth daily. 09/22/22  Yes Enedina Finner, MD  melatonin 5 MG TABS Take 0.5 tablets (2.5 mg total) by mouth at bedtime as needed. 09/21/22  Yes Enedina Finner, MD  Multiple Vitamins-Minerals (CENTRUM SILVER 50+WOMEN) TABS Take by mouth.   Yes [provider]  pantoprazole (PROTONIX) 40 MG tablet Take 1 tablet (40 mg total) by mouth 2 (two) times daily. 09/21/22  Yes Enedina Finner, MD  sucralfate (CARAFATE) 1 g tablet Take 1 g by mouth 4 (four) times daily -  with meals and at bedtime. 09/29/22 09/29/23 Yes [provider]  Vibegron (GEMTESA) 75 MG TABS Take 1 tablet (75 mg total) by mouth daily. 07/30/22  Yes  Stoioff, Verna Czech, MD  HYDROcodone-acetaminophen (NORCO/VICODIN) 5-325 MG tablet Take 1 tablet by mouth every 6 (six) hours as needed for moderate pain. Patient not taking: Reported on 09/30/2022 09/21/22   Enedina Finner, MD   Allergies  Allergen Reactions   Nitrofurantoin Hives   Sulfa Antibiotics Rash   Review of  Systems  Gastrointestinal:  Positive for abdominal pain.    Physical Exam Pulmonary:     Effort: Pulmonary effort is normal.  Neurological:     Mental Status: She is alert.     Vital Signs: BP (!) 140/67 (BP Location: Right Arm)   Pulse 89   Temp 98.4 F (36.9 C)   Resp 18   SpO2 96%  Pain Scale: 0-10   Pain Score: 3    SpO2: SpO2: 96 % O2 Device:SpO2: 96 % O2 Flow Rate: .O2 Flow Rate (L/min): 2 L/min  IO: Intake/output summary:  Intake/Output Summary (Last 24 hours) at 10/07/2022 0800 Last data filed at 10/07/2022 0359 Gross per 24 hour  Intake 207.93 ml  Output 0 ml  Net 207.93 ml    LBM: Last BM Date : 10/06/22 Baseline Weight:   Most recent weight:        Signed by: Morton Stall, NP   Please contact Palliative Medicine Team phone at [phone] for questions and concerns.  For individual provider: See Loretha Stapler

## 2022-10-06 NOTE — Care Management Important Message (Signed)
Important Message  Patient Details  Name: Felicia Hughes MRN: [id number] Date of Birth: Feb 23, 1934   Medicare Important Message Given:  N/A - LOS <3 / Initial given by admissions     Felicia Hughes [name] 10/06/2022, 11:16 AM

## 2022-10-06 NOTE — Progress Notes (Signed)
Patient ID: Felicia Hughes, female   DOB: [birth year], 89 y.o.   MRN: [id number]     SURGICAL PROGRESS NOTE   Hospital Day(s): 5.   Interval History: Patient seen and examined, no acute events or new complaints overnight. Patient reports feeling uncomfortable.  She continued having generalized abdominal pain.  No specific localized pain.  Patient cannot identify any alleviating or aggravating factors.  She is also very uncomfortable due to diarrhea.  C. difficile testing was negative.  Vital signs in last 24 hours: [min-max] current  Temp:  [98.5 F (36.9 C)-98.7 F (37.1 C)] 98.7 F (37.1 C) (07/16 0739) Pulse Rate:  [89-93] 89 (07/16 0739) Resp:  [16-18] 16 (07/16 0739) BP: (137-153)/(59-75) 153/75 (07/16 0739) SpO2:  [95 %-97 %] 96 % (07/16 0739)             Physical Exam:  Constitutional: alert, cooperative, body pain Respiratory: breathing non-labored at rest  Cardiovascular: regular rate and sinus rhythm  Gastrointestinal: soft, non-tender, and non-distended.  Labs:     Latest Ref Rng & Units 10/04/2022    5:20 PM 10/02/2022    8:37 AM 09/15/2022    5:13 AM  CBC  WBC 4.0 - 10.5 K/uL 11.5  13.7  14.3   Hemoglobin 12.0 - 15.0 g/dL 88.4  16.6  06.3   Hematocrit 36.0 - 46.0 % 33.8  32.7  37.6   Platelets 150 - 400 K/uL 612  631  442       Latest Ref Rng & Units 10/04/2022    5:20 PM 10/02/2022    8:37 AM 09/21/2022    4:36 AM  CMP  Glucose 70 - 99 mg/dL 016  84  98   BUN 8 - 23 mg/dL 12  10  16    Creatinine 0.44 - 1.00 mg/dL 0.10  9.32  3.55   Sodium 135 - 145 mmol/L 137  140  140   Potassium 3.5 - 5.1 mmol/L 3.0  3.4  3.5   Chloride 98 - 111 mmol/L 105  108  103   CO2 22 - 32 mmol/L 25  23  25    Calcium 8.9 - 10.3 mg/dL 8.2  8.0  7.8     Imaging studies: No new pertinent imaging studies   Assessment/Plan:  89 y.o. female with intra-abdominal abscess status post percutaneous drainage, complicated by pertinent comorbidities including recent duodenal perforation.    -Patient continue to feel uncomfortable and in pain. -Patient has been pressing tiredness and feelings of not wanting to keep getting more treatments. -Her main goal is to be of comfortable as possible.  This includes pain control and diarrhea management. -I will add Imodium -I will consult palliative care service for discussion of palliative versus hospice recommendations  Gae Gallop, MD

## 2022-10-06 NOTE — TOC Progression Note (Signed)
Transition of Care [facility]) - Progression Note    Patient Details  Name: Felicia Hughes MRN: [id number] Date of Birth: 1933/11/21  Transition of Care [facility]) CM/SW Contact  Chapman Fitch, RN Phone Number: 10/06/2022, 10:51 AM  Clinical Narrative:    Secure chat message sent to MD to determine if patient is medically ready for discharge today.  MD is aware that facility requires all DC paper work by 130 pm   Expected Discharge Plan: Skilled Nursing Facility    Expected Discharge Plan and Services                                               Social Determinants of Health (SDOH) Interventions SDOH Screenings   Food Insecurity: No Food Insecurity (09/14/2022)  Housing: Low Risk  (09/14/2022)  Transportation Needs: No Transportation Needs (09/14/2022)  Utilities: Not At Risk (09/14/2022)  Financial Resource Strain: Low Risk  (06/29/2022)   Received from [facility], [facility]  Tobacco Use: Low Risk  (10/01/2022)    Readmission Risk Interventions    09/05/2022   11:39 AM  Readmission Risk Prevention Plan  Post Dischage Appt Complete  Medication Screening Complete  Transportation Screening Complete

## 2022-10-07 MED ORDER — HYDROCODONE-ACETAMINOPHEN 5-325 MG PO TABS: 1.0000 | ORAL_TABLET | ORAL | 0 refills | Status: AC | PRN

## 2022-10-07 MED ORDER — HYDROCODONE-ACETAMINOPHEN 5-325 MG PO TABS: 1.0000 | ORAL_TABLET | ORAL | 0 refills | Status: DC | PRN

## 2022-10-07 MED ORDER — AMOXICILLIN-POT CLAVULANATE 875-125 MG PO TABS: 1.0000 | ORAL_TABLET | Freq: Two times a day (BID) | ORAL | 0 refills | Status: AC

## 2022-10-07 NOTE — Plan of Care (Signed)
PMT consulted for goals of care.  As per epic chat patient has decided to return to her facility with SNF.  Recommend outpatient palliative with transition to hospice when patient and family are ready.

## 2022-10-07 NOTE — TOC Progression Note (Signed)
Transition of Care [facility]) - Progression Note    Patient Details  Name: Felicia Hughes MRN: [id number] Date of Birth: 1933/09/22  Transition of Care [facility]) CM/SW Contact  Chapman Fitch, RN Phone Number: 10/07/2022, 10:04 AM  Clinical Narrative:     Spoke with son Thayer Ohm who states that patient was feeling better over night, and would like to return to [facility] with rehab, not hospice.  Patient has current insurance auth valid through today.  MD and palliative team notified   Expected Discharge Plan: Skilled Nursing Facility    Expected Discharge Plan and Services                                               Social Determinants of Health (SDOH) Interventions SDOH Screenings   Food Insecurity: No Food Insecurity (09/14/2022)  Housing: Low Risk  (09/14/2022)  Transportation Needs: No Transportation Needs (09/14/2022)  Utilities: Not At Risk (09/14/2022)  Financial Resource Strain: Low Risk  (06/29/2022)   Received from [facility], [facility]  Tobacco Use: Low Risk  (10/01/2022)    Readmission Risk Interventions    09/05/2022   11:39 AM  Readmission Risk Prevention Plan  Post Dischage Appt Complete  Medication Screening Complete  Transportation Screening Complete

## 2022-10-07 NOTE — Progress Notes (Signed)
Report called to [employer] at [place].

## 2022-10-07 NOTE — Discharge Instructions (Signed)
  Diet: Resume home heart healthy regular diet.   Activity: Continue physical and occupational therapy at [facility]  Medications: Resume all home medications. For mild to moderate pain: acetaminophen (Tylenol) or ibuprofen (if no kidney disease). Combining Tylenol with alcohol can substantially increase your risk of causing liver disease. Narcotic pain medications, if prescribed, can be used for severe pain, though may cause nausea, constipation, and drowsiness. Do not combine Tylenol and Norco within a 6 hour period as Norco contains Tylenol. If you do not need the narcotic pain medication, you do not need to fill the prescription.  Call office [phone]) at any time if any questions, worsening pain, fevers/chills, bleeding, drainage from incision site, or other concerns.

## 2022-10-07 NOTE — Progress Notes (Addendum)
Civil engineer, contracting [facility]) Hospital Liaison Note:   (new referral for outpatient palliative services) Notified by [facility], Bevelyn Ngo, RN, of patient/family request for [facility] Palliative Care services at [facility].   Referrals team notified of discharge today.   Please call with any hospice or outpatient palliative care related questions. Thank you for the opportunity to participate in this patient's care.  Redge Gainer, [facility] Liaison [phone]

## 2022-10-07 NOTE — TOC Transition Note (Signed)
Transition of Care [facility]) - CM/SW Discharge Note   Patient Details  Name: Felicia Hughes MRN: [id number] Date of Birth: [birth year]  Transition of Care [facility]) CM/SW Contact:  Chapman Fitch, RN Phone Number: 10/07/2022, 11:20 AM   Clinical Narrative:      Patient will DC to: [facility] Outpatient palliative referral made to Ree Kida with [facility]  Anticipated DC date: 10/07/22  Family notified: Son Thayer Ohm Transport by:Son Thayer Ohm   Per MD patient ready for DC to . RN, patient, patient's family, and facility notified of DC. Discharge Summary sent to facility. RN given number for report. Bed side RN to place sign script in dc packet TOC signing off      Patient Goals and CMS Choice      Discharge Placement                         Discharge Plan and Services Additional resources added to the After Visit Summary for                                       Social Determinants of Health (SDOH) Interventions SDOH Screenings   Food Insecurity: No Food Insecurity (09/14/2022)  Housing: Low Risk  (09/14/2022)  Transportation Needs: No Transportation Needs (09/14/2022)  Utilities: Not At Risk (09/14/2022)  Financial Resource Strain: Low Risk  (06/29/2022)   Received from [facility], [facility]  Tobacco Use: Low Risk  (10/01/2022)     Readmission Risk Interventions    09/05/2022   11:39 AM  Readmission Risk Prevention Plan  Post Dischage Appt Complete  Medication Screening Complete  Transportation Screening Complete

## 2022-10-07 NOTE — Discharge Summary (Signed)
Patient ID: Felicia Hughes MRN: [id number] DOB/AGE: Jun 02, 1933 89 y.o.  Admit date: 09/30/2022 Discharge date: 10/07/2022   Discharge Diagnoses:  Principal Problem:   Intra-abdominal abscess [facility])   Procedures: CT guided percutaneous drain of intra abdominal abscess  Hospital Course: Patient admitted with intra-abdominal abscess 2 weeks after repair of perforated duodenal ulcer.  Intra-abdominal abscess was treated with IV antibiotic therapy and percutaneous drainage.  Patient has been doing better.  Initially it was difficult to control her pain and diarrhea and she even consider hospice due to feeling so uncomfortable.  Yesterday after diarrhea treatment with Imodium she started feeling better and today she would like to return back to her skilled nursing facility.  Patient is able to ambulate with assistance.  Still not eating good amounts due to decreased appetite which has been a chronic issue.  Repeated CT scan shows significant improvement of the intra-abdominal abscess down to very small 2 cm fluid collection.  There has been no fever.  Physical Exam Vitals reviewed.  HENT:     Head: Normocephalic.  Cardiovascular:     Rate and Rhythm: Normal rate and regular rhythm.  Pulmonary:     Effort: Pulmonary effort is normal.     Breath sounds: Normal breath sounds.  Abdominal:     General: Abdomen is flat. Bowel sounds are normal. There is no distension.     Palpations: Abdomen is soft.     Tenderness: There is no abdominal tenderness.  Musculoskeletal:     Cervical back: Normal range of motion.  Neurological:     Mental Status: She is alert and oriented to person, place, and time.      Consults: Palliative Care   Disposition: Discharge disposition: 03-Skilled Nursing Facility       Discharge Instructions     Diet - low sodium heart healthy   Complete by: As directed    Increase activity slowly   Complete by: As directed       Allergies as of 10/07/2022        Reactions   Nitrofurantoin Hives   Sulfa Antibiotics Rash        Medication List     STOP taking these medications    acetaminophen 325 MG tablet Commonly known as: Tylenol   docusate sodium 100 MG capsule Commonly known as: Colace       TAKE these medications    amoxicillin-clavulanate 875-125 MG tablet Commonly known as: AUGMENTIN Take 1 tablet by mouth 2 (two) times daily for 5 days.   atorvastatin 20 MG tablet Commonly known as: LIPITOR Take 20 mg by mouth daily at 6 PM.   Centrum Silver 50+Women Tabs Take by mouth.   cholecalciferol 1000 units tablet Commonly known as: VITAMIN D Take 1,000 Units by mouth daily.   Gemtesa 75 MG Tabs Generic drug: Vibegron Take 1 tablet (75 mg total) by mouth daily.   HYDROcodone-acetaminophen 5-325 MG tablet Commonly known as: NORCO/VICODIN Take 1 tablet by mouth every 6 (six) hours as needed for moderate pain. What changed: Another medication with the same name was added. Make sure you understand how and when to take each.   HYDROcodone-acetaminophen 5-325 MG tablet Commonly known as: Norco Take 1 tablet by mouth every 4 (four) hours as needed for up to 3 days for moderate pain. What changed: You were already taking a medication with the same name, and this prescription was added. Make sure you understand how and when to take each.   losartan 50 MG tablet  Commonly known as: COZAAR Take 1 tablet (50 mg total) by mouth daily.   melatonin 5 MG Tabs Take 0.5 tablets (2.5 mg total) by mouth at bedtime as needed.   pantoprazole 40 MG tablet Commonly known as: PROTONIX Take 1 tablet (40 mg total) by mouth 2 (two) times daily.   sucralfate 1 g tablet Commonly known as: CARAFATE Take 1 g by mouth 4 (four) times daily -  with meals and at bedtime.        Follow-up Information     Carolan Shiver, MD Follow up in 3 week(s).   Specialty: General Surgery Contact information: [address] [place]  [place] [address] [phone]

## 2022-10-07 NOTE — Plan of Care (Signed)
Resolving care plan patient transitioning to another level of care.  Felicia Hughes

## 2022-10-07 NOTE — TOC Progression Note (Signed)
Transition of Care [facility]) - Progression Note    Patient Details  Name: Felicia Hughes MRN: [id number] Date of Birth: 05-21-1933  Transition of Care [facility]) CM/SW Contact  Chapman Fitch, RN Phone Number: 10/07/2022, 10:03 AM  Clinical Narrative:     Notified by Palliative that plan is to return to [facility] tomorrow with hospice services.    Expected Discharge Plan: Skilled Nursing Facility    Expected Discharge Plan and Services                                               Social Determinants of Health (SDOH) Interventions SDOH Screenings   Food Insecurity: No Food Insecurity (09/14/2022)  Housing: Low Risk  (09/14/2022)  Transportation Needs: No Transportation Needs (09/14/2022)  Utilities: Not At Risk (09/14/2022)  Financial Resource Strain: Low Risk  (06/29/2022)   Received from [facility], [facility]  Tobacco Use: Low Risk  (10/01/2022)    Readmission Risk Interventions    09/05/2022   11:39 AM  Readmission Risk Prevention Plan  Post Dischage Appt Complete  Medication Screening Complete  Transportation Screening Complete

## 2022-10-09 ENCOUNTER — Other Ambulatory Visit: Payer: Self-pay | Admitting: General Surgery

## 2022-10-09 DIAGNOSIS — T8143XA Infection following a procedure, organ and space surgical site, initial encounter: Secondary | ICD-10-CM

## 2022-10-18 ENCOUNTER — Emergency Department: Admission: EM | Admit: 2022-10-18 | Payer: Medicare Other | Source: Home / Self Care

## 2022-10-18 ENCOUNTER — Emergency Department: Payer: Medicare Other

## 2022-10-18 ENCOUNTER — Other Ambulatory Visit: Payer: Self-pay

## 2022-10-18 DIAGNOSIS — R1032 Left lower quadrant pain: Secondary | ICD-10-CM | POA: Diagnosis present

## 2022-10-18 DIAGNOSIS — D649 Anemia, unspecified: Secondary | ICD-10-CM | POA: Diagnosis not present

## 2022-10-18 DIAGNOSIS — I129 Hypertensive chronic kidney disease with stage 1 through stage 4 chronic kidney disease, or unspecified chronic kidney disease: Secondary | ICD-10-CM | POA: Diagnosis not present

## 2022-10-18 DIAGNOSIS — R1031 Right lower quadrant pain: Secondary | ICD-10-CM | POA: Insufficient documentation

## 2022-10-18 DIAGNOSIS — N189 Chronic kidney disease, unspecified: Secondary | ICD-10-CM | POA: Insufficient documentation

## 2022-10-18 DIAGNOSIS — R1030 Lower abdominal pain, unspecified: Secondary | ICD-10-CM

## 2022-10-18 LAB — COMPREHENSIVE METABOLIC PANEL
ALT: 20 U/L (ref 0–44)
AST: 34 U/L (ref 15–41)
Albumin: 2.9 g/dL — ABNORMAL LOW (ref 3.5–5.0)
Alkaline Phosphatase: 145 U/L — ABNORMAL HIGH (ref 38–126)
Anion gap: 9 (ref 5–15)
BUN: 12 mg/dL (ref 8–23)
CO2: 25 mmol/L (ref 22–32)
Calcium: 8.7 mg/dL — ABNORMAL LOW (ref 8.9–10.3)
Chloride: 100 mmol/L (ref 98–111)
Creatinine, Ser: 0.83 mg/dL (ref 0.44–1.00)
GFR, Estimated: >60 mL/min (ref >60)
Glucose, Bld: 151 mg/dL — ABNORMAL HIGH (ref 70–99)
Potassium: 4.3 mmol/L (ref 3.5–5.1)
Sodium: 134 mmol/L — ABNORMAL LOW (ref 135–145)
Total Bilirubin: 0.3 mg/dL (ref 0.3–1.2)
Total Protein: 6.4 g/dL — ABNORMAL LOW (ref 6.5–8.1)

## 2022-10-18 LAB — URINALYSIS, ROUTINE W REFLEX MICROSCOPIC
Bilirubin Urine: NEGATIVE
Glucose, UA: NEGATIVE mg/dL
Hgb urine dipstick: NEGATIVE
Ketones, ur: NEGATIVE mg/dL
Leukocytes,Ua: NEGATIVE
Nitrite: NEGATIVE
Protein, ur: NEGATIVE mg/dL
Specific Gravity, Urine: 1.006 (ref 1.005–1.030)
pH: 7 (ref 5.0–8.0)

## 2022-10-18 LAB — CBC
HCT: 35.6 % — ABNORMAL LOW (ref 36.0–46.0)
Hemoglobin: 11.3 g/dL — ABNORMAL LOW (ref 12.0–15.0)
MCH: 31.6 pg (ref 26.0–34.0)
MCHC: 31.7 g/dL (ref 30.0–36.0)
MCV: 99.4 fL (ref 80.0–100.0)
Platelets: 580 10*3/uL — ABNORMAL HIGH (ref 150–400)
RBC: 3.58 MIL/uL — ABNORMAL LOW (ref 3.87–5.11)
RDW: 14 % (ref 11.5–15.5)
WBC: 10 10*3/uL (ref 4.0–10.5)
nRBC: 0 % (ref 0.0–0.2)

## 2022-10-18 LAB — LIPASE, BLOOD: Lipase: 36 U/L (ref 11–51)

## 2022-10-18 MED ORDER — IOHEXOL 300 MG/ML  SOLN
100.0000 mL | Freq: Once | INTRAMUSCULAR | Status: AC | PRN
  Administered 2022-10-18: 100 mL via INTRAVENOUS

## 2022-10-18 MED ORDER — HYDROMORPHONE HCL 1 MG/ML IJ SOLN
0.5000 mg | Freq: Once | INTRAMUSCULAR | Status: AC
  Administered 2022-10-18: 0.5 mg via INTRAVENOUS
  Filled 2022-10-18: qty 0.5

## 2022-10-18 MED ORDER — SODIUM CHLORIDE 0.9 % IV BOLUS
1000.0000 mL | Freq: Once | INTRAVENOUS | Status: AC
  Administered 2022-10-18: 1000 mL via INTRAVENOUS

## 2022-10-18 NOTE — ED Triage Notes (Signed)
Pt to ed from [facility] via [facility] for abd pain. Pt has HX of hernia repair. Pt is caox4, in no acute distress and in a wheel chair in triage.

## 2022-10-18 NOTE — ED Notes (Signed)
Pts daughter provided an update.

## 2022-10-18 NOTE — ED Notes (Signed)
Pt assisted to the restroom by this RN via wheelchair. Minimal assist after standing. Urine specimen collected and labeled in front of the patient.

## 2022-10-18 NOTE — ED Provider Notes (Signed)
[facility] Provider Note    Event Date/Time   First MD Initiated Contact with Patient 10/18/22 2256     (approximate)   History   Abdominal Pain   HPI  Felicia Hughes is a 89 y.o. female with history of CKD, HTN presenting to the emergency department for evaluation of abdominal pain.  Recent complicated abdominal history as below.  Reports she developed worsening left lower quadrant pain yesterday, increased today.  Says this feels different than when she recently presented back with an intra-abdominal abscess.  No fevers.  Having regular bowel movements, last episode today.  Denies nausea or vomiting.  Still has JP drain in place over the right side of the abdomen, reports minimal drainage from this recently, scheduled to have removed on Tuesday.  On 6/14, patient had open right incarcerated inguinal hernia repair.  She presented back with worsened abdominal pain with CT demonstrating perforated viscus and she was taken to the OR where a perforated duodenal ulcer was identified.  Her postoperative course was complicated and intra-abdominal abscess that developed 2 weeks after repair of her duodenal ulcer which was treated with IV antibiotic therapy and percutaneous drainage.  She was discharged on 7/17 after CT demonstrated significant improvement in abscess.  Physical Exam   Triage Vital Signs: ED Triage Vitals  Encounter Vitals Group     BP 10/18/22 2023 133/67     Systolic BP Percentile --      Diastolic BP Percentile --      Pulse Rate 10/18/22 2023 (!) 105     Resp 10/18/22 2023 16     Temp 10/18/22 2023 98.4 F (36.9 C)     Temp Source 10/18/22 2023 Oral     SpO2 10/18/22 2023 98 %     Weight 10/18/22 2024 105 lb 13.1 oz (48 kg)     Height 10/18/22 2024 5' (1.524 m)     Head Circumference --      Peak Flow --      Pain Score 10/18/22 2023 0     Pain Loc --      Pain Education --      Exclude from Growth Chart --     Most recent vital  signs: Vitals:   10/19/22 0030 10/19/22 0100  BP: (!) 152/76 (!) 146/70  Pulse: 91 92  Resp: 18 18  Temp:    SpO2: 92% 92%     General: Awake, interactive  CV:  Regular rate, good peripheral perfusion.  Resp:  Lungs clear, unlabored respirations.  Abd:  Soft, nondistended, tender to palpation primarily in the left lower quadrant, but some tenderness over the suprapubic and right lower quadrant as well.  There is a JP drain in place over the right side of the abdomen with a minimal amount of yellow fluid Neuro:  Symmetric facial movement, fluid speech   ED Results / Procedures / Treatments   Labs (all labs ordered are listed, but only abnormal results are displayed) Labs Reviewed  COMPREHENSIVE METABOLIC PANEL - Abnormal; Notable for the following components:      Result Value   Sodium 134 (*)    Glucose, Bld 151 (*)    Calcium 8.7 (*)    Total Protein 6.4 (*)    Albumin 2.9 (*)    Alkaline Phosphatase 145 (*)    All other components within normal limits  CBC - Abnormal; Notable for the following components:   RBC 3.58 (*)    Hemoglobin 11.3 (*)  HCT 35.6 (*)    Platelets 580 (*)    All other components within normal limits  URINALYSIS, ROUTINE W REFLEX MICROSCOPIC - Abnormal; Notable for the following components:   Color, Urine YELLOW (*)    APPearance CLEAR (*)    All other components within normal limits  LIPASE, BLOOD     EKG EKG independently reviewed interpreted by myself (ER attending) demonstrates:    RADIOLOGY Imaging independently reviewed and interpreted by myself demonstrates:  CT without worsening intra-abdominal abscess, radiology does not note any other acute process.  Previously noted fluid collection has resolved.  PROCEDURES:  Critical Care performed: No  Procedures   MEDICATIONS ORDERED IN ED: Medications  HYDROmorphone (DILAUDID) injection 0.5 mg (0.5 mg Intravenous Given 10/18/22 2334)  sodium chloride 0.9 % bolus 1,000 mL (0 mLs  Intravenous Stopped 10/19/22 0055)  iohexol (OMNIPAQUE) 300 MG/ML solution 100 mL (100 mLs Intravenous Contrast Given 10/18/22 2342)     IMPRESSION / MDM / ASSESSMENT AND PLAN / ED COURSE  I reviewed the triage vital signs and the nursing notes.  Differential diagnosis includes, but is not limited to, worsening intra-abdominal abscess, diverticulitis, bowel obstruction, other acute intra-abdominal process  Patient's presentation is most consistent with acute presentation with potential threat to life or bodily function.  89 year old female with recent complicated intra-abdominal history presenting to the emergency department with worsening left lower quadrant pain.  Labs from triage with normal white blood cell count, stable anemia, CMP without severe derangement.  Urine without evidence of infection, normal lipase.  However, with her recent complicated intra-abdominal history, will obtain repeat CT for further evaluation.  IV fluids and pain control ordered.  CT fortunately demonstrates resolution of patient's recent abscess with no new fluid collection or other acute abnormality.  Patient reevaluated.  She reports her pain is significantly improved.  She is requesting discharge home.  Given her reassuring workup, do not think this is unreasonable.  She has a follow-up on Tuesday with surgery to have her drain removed.  Strict return precautions were provided.  She was discharged stable condition.       FINAL CLINICAL IMPRESSION(S) / ED DIAGNOSES   Final diagnoses:  Lower abdominal pain     Rx / DC Orders   ED Discharge Orders     None        Note:  This document was prepared using Dragon voice recognition software and may include unintentional dictation errors.   Trinna Post, MD 10/19/22 [phone]

## 2022-10-18 NOTE — ED Notes (Addendum)
Please call pts daughter Madelaine Okamura when update is available.

## 2022-10-19 NOTE — Discharge Instructions (Signed)
You were seen in the emergency department today for your abdominal pain.  Fortunately your testing was reassuring.  Keep your scheduled follow-up with surgery.  Return to the ER for new or worsening symptoms.

## 2022-10-20 ENCOUNTER — Ambulatory Visit
Admission: RE | Admit: 2022-10-20 | Discharge: 2022-10-20 | Disposition: A | Discharge status: Home / Self Care | Payer: Medicare Other | Source: Ambulatory Visit | Attending: General Surgery | Admitting: General Surgery

## 2022-10-20 DIAGNOSIS — K651 Peritoneal abscess: Secondary | ICD-10-CM

## 2022-10-20 DIAGNOSIS — T8143XA Infection following a procedure, organ and space surgical site, initial encounter: Secondary | ICD-10-CM

## 2022-10-20 HISTORY — PX: IR RADIOLOGIST EVAL & MGMT: IMG5224

## 2022-10-20 MED ORDER — IOPAMIDOL (ISOVUE-300) INJECTION 61%
75.0000 mL | Freq: Once | INTRAVENOUS | Status: AC | PRN
  Administered 2022-10-20: 75 mL via INTRAVENOUS

## 2022-10-20 NOTE — Progress Notes (Signed)
Chief Complaint: Patient was seen in consultation today for No chief complaint on file.  at the request of Cintron-Diaz,Edgardo  Referring Physician(s): Cintron-Diaz,Edgardo  History of Present Illness: Felicia Hughes is a 89 y.o. female With a history of perforated duodenal diverticulum complicated by right upper quadrant intra-abdominal abscess formation.  A percutaneous drainage catheter was placed on 09/30/2012.  Patient presents today for follow-up.  Drain output has been essentially nil the last several days.  CT imaging earlier today demonstrates no evidence of residual abscess cavity.  Contrast injection demonstrates no fistulous communication.  She is ready for the catheter to be removed.  Past Medical History:  Diagnosis Date   Arrhythmia    Chronic kidney disease    UTI   GERD (gastroesophageal reflux disease)    Glaucoma (increased eye pressure)    Hypercholesterolemia    Hypertension     Past Surgical History:  Procedure Laterality Date   ABDOMINAL HYSTERECTOMY     BREAST BIOPSY Right 06/11/2015   Procedure: BREAST BIOPSY WITH NEEDLE LOCALIZATION;  Surgeon: Nadeen Landau, MD;  Location: [facility] ORS;  Service: General;  Laterality: Right;   BREAST EXCISIONAL BIOPSY Right 06/11/2015   papilloma   BREAST SURGERY Right    Breast Needle Biopsy   CHOLECYSTECTOMY     DILATION AND CURETTAGE OF UTERUS     EYE SURGERY Bilateral    Cataract Extraction with IOL   INGUINAL HERNIA REPAIR Right 09/04/2022   Procedure: HERNIA REPAIR INGUINAL ADULT;  Surgeon: Leafy Ro, MD;  Location: [facility];  Service: General;  Laterality: Right;   IR RADIOLOGIST EVAL & MGMT  10/20/2022   LAPAROSCOPIC OOPHORECTOMY     LAPAROTOMY N/A 09/14/2022   Procedure: EXPLORATORY LAPAROTOMY, closure of duodenum with omentum patch;  Surgeon: Carolan Shiver, MD;  Location: [facility];  Service: General;  Laterality: N/A;    Allergies: Nitrofurantoin and Sulfa  antibiotics  Medications: Prior to Admission medications   Medication Sig Start Date End Date Taking? Authorizing Provider  atorvastatin (LIPITOR) 20 MG tablet Take 20 mg by mouth daily at 6 PM.    [provider]  cholecalciferol (VITAMIN D) 1000 units tablet Take 1,000 Units by mouth daily.    [provider]  HYDROcodone-acetaminophen (NORCO/VICODIN) 5-325 MG tablet Take 1 tablet by mouth every 6 (six) hours as needed for moderate pain. Patient not taking: Reported on 09/30/2022 09/21/22   Enedina Finner, MD  losartan (COZAAR) 50 MG tablet Take 1 tablet (50 mg total) by mouth daily. 09/22/22   Enedina Finner, MD  melatonin 5 MG TABS Take 0.5 tablets (2.5 mg total) by mouth at bedtime as needed. 09/21/22   Enedina Finner, MD  Multiple Vitamins-Minerals (CENTRUM SILVER 50+WOMEN) TABS Take by mouth.    [provider]  pantoprazole (PROTONIX) 40 MG tablet Take 1 tablet (40 mg total) by mouth 2 (two) times daily. 09/21/22   Enedina Finner, MD  sucralfate (CARAFATE) 1 g tablet Take 1 g by mouth 4 (four) times daily -  with meals and at bedtime. 09/29/22 09/29/23  [provider]  Vibegron (GEMTESA) 75 MG TABS Take 1 tablet (75 mg total) by mouth daily. 07/30/22   Stoioff, Verna Czech, MD     Family History  Problem Relation Age of Onset   Breast cancer Mother 7       early 17's   Kidney cancer Neg Hx    Kidney disease Neg Hx    Prostate cancer Neg Hx  Bladder Cancer Neg Hx     Social History   Socioeconomic History   Marital status: Single    Spouse name: Not on file   Number of children: Not on file   Years of education: Not on file   Highest education level: Not on file  Occupational History   Not on file  Tobacco Use   Smoking status: Never   Smokeless tobacco: Never  Vaping Use   Vaping status: Never Used  Substance and Sexual Activity   Alcohol use: Yes    Alcohol/week: 1.0 standard drink of alcohol    Types: 1 Glasses of wine per week    Comment: wine daily    Drug use: No   Sexual activity: Yes    Birth control/protection: None  Other Topics Concern   Not on file  Social History Narrative   Not on file   Social Determinants of Health   Financial Resource Strain: Low Risk  (06/29/2022)   [facility]   Overall Financial Resource Strain (CARDIA)    Difficulty of Paying Living Expenses: Not hard at all  Food Insecurity: No Food Insecurity (09/14/2022)   Hunger Vital Sign    Worried About Running Out of Food in the Last Year: Never true    Ran Out of Food in the Last Year: Never true  Transportation Needs: No Transportation Needs (09/14/2022)   PRAPARE - Administrator, Civil Service (Medical): No    Lack of Transportation (Non-Medical): No  Physical Activity: Not on file  Stress: Not on file  Social Connections: Not on file     Review of Systems: A 12 point ROS discussed and pertinent positives are indicated in the HPI above.  All other systems are negative.  Review of Systems  Vital Signs: There were no vitals taken for this visit.  Advance Care Plan: The advanced care plan/surrogate decision maker was discussed at the time of visit and the patient did not wish to discuss or was not able to name a surrogate decision maker or provide an advance care plan.    Physical Exam Constitutional:      General: She is not in acute distress.    Appearance: Normal appearance. She is normal weight.  HENT:     Head: Normocephalic and atraumatic.  Eyes:     General: No scleral icterus. Cardiovascular:     Rate and Rhythm: Normal rate.  Pulmonary:     Effort: Pulmonary effort is normal.  Abdominal:     General: Abdomen is flat. There is no distension.     Palpations: Abdomen is soft.     Tenderness: There is no abdominal tenderness.     Comments: Drain site is C/D/I  Skin:    General: Skin is warm and dry.  Neurological:     Mental Status: She is alert and  oriented to person, place, and time.  Psychiatric:        Mood and Affect: Mood normal.        Behavior: Behavior normal.       Imaging: CT ABDOMEN PELVIS W CONTRAST  Result Date: 10/20/2022 CLINICAL DATA:  History of perforated duodenal ulcer with intra-abdominal abscess status post percutaneous drain placement on 10/01/2022 EXAM: CT ABDOMEN AND PELVIS WITH CONTRAST TECHNIQUE: Multidetector CT imaging of the abdomen and pelvis was performed using the standard protocol following bolus administration of intravenous contrast. RADIATION DOSE REDUCTION: This exam was performed according to  the departmental dose-optimization program which includes automated exposure control, adjustment of the mA and/or kV according to patient size and/or use of iterative reconstruction technique. CONTRAST:  75mL ISOVUE-300 IOPAMIDOL (ISOVUE-300) INJECTION 61% COMPARISON:  Recent prior CT scan 10/18/2022 FINDINGS: Lower chest: No acute abnormality. Hepatobiliary: No focal liver abnormality is seen. Status post cholecystectomy. No biliary dilatation. Pancreas: Unremarkable. No pancreatic ductal dilatation or surrounding inflammatory changes. Spleen: Normal in size without focal abnormality. Adrenals/Urinary Tract: Normal adrenal glands. No hydronephrosis, nephrolithiasis or enhancing renal mass. Multiple small circumscribed low-attenuation renal lesions bilaterally remain too small to characterize but are unchanged compared to prior imaging and are almost certainly benign. No follow-up imaging is recommended. The ureters and bladder are unremarkable. Stomach/Bowel: Colonic diverticular disease without CT evidence of active inflammation. No evidence of obstruction. Vascular/Lymphatic: No aneurysm. Scattered atherosclerotic plaque. No suspicious lymphadenopathy. Reproductive: Status post hysterectomy. No adnexal masses. Other: Pelvic floor laxity. No evidence of ascites. No abdominal wall hernia. Musculoskeletal: No acute  fracture or aggressive appearing lytic or blastic osseous lesion. Grade 1 anterolisthesis of L4 on L5 measures 0.9 cm. IMPRESSION: 1. Resolved right upper quadrant abscess cavity with well-positioned drainage catheter. 2. Lower lumbar degenerative disc disease with grade 1 anterolisthesis of L4 on L5. 3. Colonic diverticular disease without CT evidence of active inflammation. 4. Pelvic floor laxity. 5.  Aortic Atherosclerosis (ICD10-I70.0). Electronically Signed   By: Malachy Moan M.D.   On: 10/20/2022 15:21   IR Radiologist Eval & Mgmt  Result Date: 10/20/2022 EXAM: ESTABLISHED PATIENT OFFICE VISIT CHIEF COMPLAINT: SEE EPIC NOTE HISTORY OF PRESENT ILLNESS: SEE EPIC NOTE REVIEW OF SYSTEMS: SEE EPIC NOTE PHYSICAL EXAMINATION: SEE EPIC NOTE ASSESSMENT AND PLAN: SEE EPIC NOTE Electronically Signed   By: Malachy Moan M.D.   On: 10/20/2022 15:18   CT ABDOMEN PELVIS W CONTRAST  Result Date: 10/19/2022 CLINICAL DATA:  Left lower quadrant pain EXAM: CT ABDOMEN AND PELVIS WITH CONTRAST TECHNIQUE: Multidetector CT imaging of the abdomen and pelvis was performed using the standard protocol following bolus administration of intravenous contrast. RADIATION DOSE REDUCTION: This exam was performed according to the departmental dose-optimization program which includes automated exposure control, adjustment of the mA and/or kV according to patient size and/or use of iterative reconstruction technique. CONTRAST:  OMNIPAQUE IOHEXOL 300 MG/ML  SOLN COMPARISON:  CT 10/05/2022, 09/30/2022, 09/14/2022, 09/03/2022 FINDINGS: Lower chest: Resolution of previously noted bilateral pleural effusions. Hepatobiliary: Status post cholecystectomy. Similar central and extrahepatic biliary dilatation, common bile duct diameter up to 11 mm. Pancreas: No inflammation. Pancreatic ductal dilatation most prominent at the head. Spleen: Normal in size without focal abnormality. Adrenals/Urinary Tract: Adrenal glands are normal.  Kidneys show no hydronephrosis. The bladder is normal. Similar mild urothelial enhancement of left renal pelvis. Cyst upper pole right kidney, no imaging follow-up is recommended. Stomach/Bowel: Stomach is nonenlarged. No dilated small bowel. No acute bowel wall thickening. Diverticular disease of left colon without acute inflammatory wall thickening. Vascular/Lymphatic: Nonaneurysmal aorta. Mild atherosclerosis. No suspicious lymph nodes. Reproductive: Status post hysterectomy. No adnexal masses. Other: Negative for pelvic effusion or free air. Similar positioning of right mid abdominal drainage catheter with resolution of previously noted small residual fluid collection inferior to the pigtail. No significant residual fluid at this time. Musculoskeletal: Grade 1 anterolisthesis L4 on L5. No acute osseous abnormality. IMPRESSION: 1. Similar positioning of right mid abdominal drainage catheter with resolution of previously noted small residual fluid collection inferior to the pigtail. No significant residual fluid at this time. 2. No CT  evidence for acute intra-abdominal or pelvic abnormality. Negative for bowel obstruction or bowel wall thickening. 3. Diverticular disease of left colon without acute inflammatory wall thickening. 4. Status post cholecystectomy with similar central and extrahepatic biliary dilatation as well as pancreatic ductal dilatation. Correlation with MRCP previously suggested. Electronically Signed   By: Jasmine Pang M.D.   On: 10/19/2022 00:12   CT ABDOMEN PELVIS W CONTRAST  Result Date: 10/05/2022 CLINICAL DATA:  Abscess, post percutaneous drain catheter placement 10/01/2022 EXAM: CT ABDOMEN AND PELVIS WITH CONTRAST TECHNIQUE: Multidetector CT imaging of the abdomen and pelvis was performed using the standard protocol following bolus administration of intravenous contrast. RADIATION DOSE REDUCTION: This exam was performed according to the departmental dose-optimization program which  includes automated exposure control, adjustment of the mA and/or kV according to patient size and/or use of iterative reconstruction technique. CONTRAST:  75mL OMNIPAQUE IOHEXOL 300 MG/ML  SOLN COMPARISON:  09/30/2022 and previous FINDINGS: Lower chest: Small bilateral pleural effusions as before. Hepatobiliary: No focal liver lesion. Cholecystectomy clips. Ectatic central biliary tree and CBD as before. Pancreas: Unremarkable. No pancreatic ductal dilatation or surrounding inflammatory changes. Spleen: Normal in size without focal abnormality. Adrenals/Urinary Tract: No renal mass. Symmetric renal parenchymal enhancement. Eccentric urothelial thickening and enhancement in the left renal collecting system stable since 03/06/2019. Urinary bladder physiologically distended. Stomach/Bowel: Stomach nondistended, without acute finding. Small bowel decompressed. Good distal passage of oral contrast material. Appendix not identified. The colon is nondilated. Focal wall thickening in the hepatic flexure. Scattered descending and multiple sigmoid diverticula without adjacent inflammatory change. Vascular/Lymphatic: Aortic Atherosclerosis (ICD10-170.0). Portal vein patent. No abdominal or pelvic adenopathy. Reproductive: Status post hysterectomy. No adnexal masses. Other: Right mid abdominal pigtail drain catheter with significant evacuation of the previously noted fluid collection. There is a 2.8 cm residual fluid collection around the shaft of the drain catheter inferior to the pigtail component. No new or undrained fluid collections. Musculoskeletal: Lumbar levoscoliosis. Bilateral facet DJD L4-5 likely accounts for grade 1 anterolisthesis. No acute findings. IMPRESSION: 1. Significant evacuation of right mid abdominal fluid collection, with 2.8 cm residual fluid collection around the shaft of the drain catheter. Catheter repositioning may be useful for complete evacuation. 2. Focal wall thickening in the hepatic flexure,  probably reactive due to adjacent inflammatory process. 3. Small bilateral pleural effusions as before. 4.  Aortic Atherosclerosis (ICD10-I70.0). Electronically Signed   By: Corlis Leak M.D.   On: 10/05/2022 10:32   CT GUIDED PERITONEAL/RETROPERITONEAL FLUID DRAIN BY PERC CATH  Result Date: 10/01/2022 INDICATION: Intra-abdominal abscess EXAM: CT-guided placement of drainage catheter into intra-abdominal abscess TECHNIQUE: Multidetector CT imaging of the abdomen and pelvis was performed following the standard protocol without IV contrast. RADIATION DOSE REDUCTION: This exam was performed according to the departmental dose-optimization program which includes automated exposure control, adjustment of the mA and/or kV according to patient size and/or use of iterative reconstruction technique. MEDICATIONS: The patient is currently admitted to the hospital and receiving intravenous antibiotics. The antibiotics were administered within an appropriate time frame prior to the initiation of the procedure. ANESTHESIA/SEDATION: Moderate (conscious) sedation was employed during this procedure. A total of Versed 1 mg and Fentanyl 50 mcg was administered intravenously by the radiology nurse. Total intra-service moderate Sedation Time: 13 minutes. The patient's level of consciousness and vital signs were monitored continuously by radiology nursing throughout the procedure under my direct supervision. COMPLICATIONS: None immediate. PROCEDURE: Informed written consent was obtained from the patient after a thorough discussion of the procedural risks, benefits  and alternatives. All questions were addressed. Maximal Sterile Barrier Technique was utilized including caps, mask, sterile gowns, sterile gloves, sterile drape, hand hygiene and skin antiseptic. A timeout was performed prior to the initiation of the procedure. The patient was placed supine on the exam table. Limited CT of the abdomen and pelvis was performed for planning  purposes. This demonstrated intra-abdominal collection in the right lower quadrant. Skin entry site was marked, and the overlying skin was prepped and draped in the standard sterile fashion. Local analgesia was obtained with 1% lidocaine. Using intermittent CT fluoroscopy, a 19 gauge Yueh catheter was advanced into the identified collection via a right anterolateral approach. Location was confirmed with CT and return of purulent material. Over an Amplatz wire, the percutaneous tract was serially dilated to accommodate a 12 French locking drainage catheter. Location was confirmed with CT and return of additional purulent material. Drainage catheter loop was locked, and was secured to the skin using silk suture and a dressing. It was attached to bulb suction. The patient tolerated the procedure well without immediate complication. IMPRESSION: Successful CT-guided placement of a 12 French locking drainage catheter into the right lower quadrant abdominal collection. Electronically Signed   By: Olive Bass M.D.   On: 10/01/2022 15:48   CT ABDOMEN PELVIS W CONTRAST  Result Date: 09/30/2022 CLINICAL DATA:  History of a perforated duodenal ulcer in June 2024. EXAM: CT ABDOMEN AND PELVIS WITH CONTRAST TECHNIQUE: Multidetector CT imaging of the abdomen and pelvis was performed using the standard protocol following bolus administration of intravenous contrast. RADIATION DOSE REDUCTION: This exam was performed according to the departmental dose-optimization program which includes automated exposure control, adjustment of the mA and/or kV according to patient size and/or use of iterative reconstruction technique. CONTRAST:  80mL OMNIPAQUE IOHEXOL 300 MG/ML  SOLN COMPARISON:  September 14, 2022, May 25, 2016 FINDINGS: Lower chest: Small bilateral pleural effusions. Hepatobiliary: Revisualization of a dilated common bile duct measuring approximately 10 mm in diameter, similar in comparison to prior. Status post cholecystectomy.  No new suspicious focal hepatic lesion. Pancreas: Similar prominence of the pancreatic duct without frank dilation. No peripancreatic fat stranding. Spleen: Unremarkable. Adrenals/Urinary Tract: Adrenal glands are unremarkable. No hydronephrosis. No obstructing nephrolithiasis. Bladder is decompressed with mild submucosal l wall enhancement. No new suspicious renal lesion. Stomach/Bowel: There is persistent bowel wall thickening of the duodenum, favored overall of improved since most recent prior. There is a new rim enhancing mixed density fluid collection with several internal locules of fat immediately adjacent to the proximal duodenum. It spans approximately 6.8 x 3.6 x 6.3 cm (series 2, image 32). No pneumoperitoneum. No evidence of bowel obstruction. Diverticulosis. Hiatal hernia. Prominent appearance of the pylorus, similar in comparison to priors. Vascular/Lymphatic: Atherosclerotic calcifications of the nonaneurysmal abdominal aorta. No new suspicious lymphadenopathy. Reproductive: Status post hysterectomy. No adnexal masses. Other: Postsurgical changes along the anterior abdominal wall. Trace free fluid in the pelvis. Musculoskeletal: Grade 1 anterolisthesis of L4-5. Levocurvature of the lumbar spine. IMPRESSION: 1. There is a new 6.8 cm rim enhancing mixed density fluid collection with several internal locules of fat immediately adjacent to the proximal duodenum in the expected region of the site of omental patch. Differential considerations include postsurgical seroma or abscess formation. Recommend correlation with any infectious clinical symptomatology. 2. Small bilateral pleural effusions. Aortic Atherosclerosis (ICD10-I70.0). These results will be called to the ordering clinician or representative by the Radiologist Assistant, and communication documented in the PACS or [employer]. Electronically Signed   By:  Meda Klinefelter M.D.   On: 09/30/2022 15:18    Labs:  CBC: Recent Labs     09/15/22 0513 10/02/22 0837 10/04/22 1720 10/18/22 2026  WBC 14.3* 13.7* 11.5* 10.0  HGB 12.2 10.6* 11.1* 11.3*  HCT 37.6 32.7* 33.8* 35.6*  PLT 442* 631* 612* 580*    COAGS: Recent Labs    10/01/22 0831  INR 1.1    BMP: Recent Labs    09/21/22 0436 10/02/22 0837 10/04/22 1720 10/18/22 2026  NA 140 140 137 134*  K 3.5 3.4* 3.0* 4.3  CL 103 108 105 100  CO2 25 23 25 25   GLUCOSE 98 84 142* 151*  BUN 16 10 12 12   CALCIUM 7.8* 8.0* 8.2* 8.7*  CREATININE 0.71 0.78 0.71 0.83  GFRNONAA >60 >60 >60 >60    LIVER FUNCTION TESTS: Recent Labs    09/14/22 0754 09/15/22 0513 09/16/22 0424 10/18/22 2026  BILITOT 0.8 0.7 0.6 0.3  AST 28 27 34 34  ALT 24 19 21 20   ALKPHOS 66 47 57 145*  PROT 6.4* 4.6* 5.2* 6.4*  ALBUMIN 3.7 2.4* 2.7* 2.9*    TUMOR MARKERS: No results for input(s): "AFPTM", "CEA", "CA199", "CHROMGRNA" in the last 8760 hours.  Assessment and Plan:  Pleasant 89 year old female with a history of right upper quadrant abscess related to perforated duodenal diverticulum.  The abscess has resolved and drain output is nil.  The drain was successfully removed.  No further follow-up with interventional radiology.    Electronically Signed: Sterling Big 10/20/2022, 3:34 PM   I spent a total of  15 Minutes in face to face in clinical consultation, greater than 50% of which was counseling/coordinating care for RUQ intra-abdominal abscess

## 2022-10-22 ENCOUNTER — Other Ambulatory Visit: Payer: Medicare Other

## 2023-01-29 ENCOUNTER — Encounter: Payer: Self-pay | Admitting: *Deleted

## 2023-02-15 ENCOUNTER — Ambulatory Visit
Admission: RE | Admit: 2023-02-15 | Discharge: 2023-02-15 | Disposition: A | Discharge status: Home / Self Care | Payer: Medicare Other | Attending: Gastroenterology | Admitting: Gastroenterology

## 2023-02-15 ENCOUNTER — Ambulatory Visit: Payer: Medicare Other | Admitting: Certified Registered"

## 2023-02-15 ENCOUNTER — Other Ambulatory Visit: Payer: Self-pay

## 2023-02-15 ENCOUNTER — Encounter
Admission: RE | Disposition: A | Discharge status: Home / Self Care | Payer: Self-pay | Source: Home / Self Care | Attending: Gastroenterology

## 2023-02-15 ENCOUNTER — Encounter: Payer: Self-pay | Admitting: *Deleted

## 2023-02-15 DIAGNOSIS — K449 Diaphragmatic hernia without obstruction or gangrene: Secondary | ICD-10-CM | POA: Diagnosis not present

## 2023-02-15 DIAGNOSIS — N189 Chronic kidney disease, unspecified: Secondary | ICD-10-CM | POA: Diagnosis not present

## 2023-02-15 DIAGNOSIS — K219 Gastro-esophageal reflux disease without esophagitis: Secondary | ICD-10-CM | POA: Insufficient documentation

## 2023-02-15 DIAGNOSIS — K267 Chronic duodenal ulcer without hemorrhage or perforation: Secondary | ICD-10-CM | POA: Insufficient documentation

## 2023-02-15 DIAGNOSIS — I129 Hypertensive chronic kidney disease with stage 1 through stage 4 chronic kidney disease, or unspecified chronic kidney disease: Secondary | ICD-10-CM | POA: Diagnosis not present

## 2023-02-15 DIAGNOSIS — K222 Esophageal obstruction: Secondary | ICD-10-CM | POA: Diagnosis not present

## 2023-02-15 HISTORY — PX: ESOPHAGOGASTRODUODENOSCOPY (EGD) WITH PROPOFOL: SHX5813

## 2023-02-15 SURGERY — ESOPHAGOGASTRODUODENOSCOPY (EGD) WITH PROPOFOL
Anesthesia: General

## 2023-02-15 MED ORDER — SODIUM CHLORIDE 0.9 % IV SOLN
INTRAVENOUS | Status: DC
Start: 2023-02-15 — End: 2023-02-15

## 2023-02-15 MED ORDER — PROPOFOL 10 MG/ML IV BOLUS
INTRAVENOUS | Status: DC | PRN
  Administered 2023-02-15: 40 mg via INTRAVENOUS
  Administered 2023-02-15 (×2): 20 mg via INTRAVENOUS

## 2023-02-15 MED ORDER — LIDOCAINE HCL (CARDIAC) PF 100 MG/5ML IV SOSY
PREFILLED_SYRINGE | INTRAVENOUS | Status: DC | PRN
  Administered 2023-02-15: 40 mg via INTRAVENOUS

## 2023-02-15 NOTE — H&P (Signed)
Outpatient short stay form Pre-procedure 02/15/2023  Regis Bill, MD  Primary Physician: Barbette Reichmann, MD  Reason for visit:  History of peptic ulcer disease  History of present illness:    89 y/o lady with history of hypertension, HLD, and perforated duodenal ulcer s/p patch here for EGD evaluation. She is doing well and not having any symptoms. No blood thinners. No family history of GI malignancies. No neck surgeries. Her small bowel surgery was 3 months ago.    Current Facility-Administered Medications:    0.9 %  sodium chloride infusion, , Intravenous, Continuous, Kersti Scavone, Rossie Muskrat, MD, Last Rate: 20 mL/hr at 02/15/23 1142, New Bag at 02/15/23 1142  Medications Prior to Admission  Medication Sig Dispense Refill Last Dose   atorvastatin (LIPITOR) 20 MG tablet Take 20 mg by mouth daily at 6 PM.   02/14/2023   cholecalciferol (VITAMIN D) 1000 units tablet Take 1,000 Units by mouth daily.   02/14/2023   losartan (COZAAR) 50 MG tablet Take 1 tablet (50 mg total) by mouth daily. 30 tablet 1 02/14/2023   melatonin 5 MG TABS Take 0.5 tablets (2.5 mg total) by mouth at bedtime as needed. 30 tablet 0 Past Week   pantoprazole (PROTONIX) 40 MG tablet Take 1 tablet (40 mg total) by mouth 2 (two) times daily. 60 tablet 1 02/14/2023   sucralfate (CARAFATE) 1 g tablet Take 1 g by mouth 4 (four) times daily -  with meals and at bedtime.   02/14/2023   Vibegron (GEMTESA) 75 MG TABS Take 1 tablet (75 mg total) by mouth daily. 28 tablet 0 02/14/2023   HYDROcodone-acetaminophen (NORCO/VICODIN) 5-325 MG tablet Take 1 tablet by mouth every 6 (six) hours as needed for moderate pain. 20 tablet 0    Multiple Vitamins-Minerals (CENTRUM SILVER 50+WOMEN) TABS Take by mouth.        Allergies  Allergen Reactions   Nitrofurantoin Hives   Sulfa Antibiotics Rash     Past Medical History:  Diagnosis Date   Arrhythmia    Chronic kidney disease    UTI   GERD (gastroesophageal reflux disease)     Glaucoma (increased eye pressure)    Hypercholesterolemia    Hypertension     Review of systems:  Otherwise negative.    Physical Exam  Gen: Alert, oriented. Appears stated age.  HEENT: PERRLA. Lungs: No respiratory distress CV: RRR Abd: soft, benign, no masses Ext: No edema    Planned procedures: Proceed with EGD. The patient understands the nature of the planned procedure, indications, risks, alternatives and potential complications including but not limited to bleeding, infection, perforation, damage to internal organs and possible oversedation/side effects from anesthesia. The patient agrees and gives consent to proceed.  Please refer to procedure notes for findings, recommendations and patient disposition/instructions.     Regis Bill, MD [facility]

## 2023-02-15 NOTE — Op Note (Signed)
[facility] Gastroenterology Patient Name: Felicia Hughes Procedure Date: 02/15/2023 12:19 PM MRN: [id number] Account #: [id number] Date of Birth: July 18, 1933 Admit Type: Outpatient Age: 89 Room: [facility] ENDO ROOM 3 Gender: Female Note Status: Finalized Instrument Name: Upper Endoscope [id number] Procedure:             Upper GI endoscopy Indications:           Follow-up of duodenal ulcer with perforation,                         Follow-up of duodenal perforation Providers:             Eather Colas MD, MD Referring MD:          Barbette Reichmann, MD (Referring MD) Medicines:             Monitored Anesthesia Care Complications:         No immediate complications. Procedure:             Pre-Anesthesia Assessment:                        - Prior to the procedure, a History and Physical was                         performed, and patient medications and allergies were                         reviewed. The patient is competent. The risks and                         benefits of the procedure and the sedation options and                         risks were discussed with the patient. All questions                         were answered and informed consent was obtained.                         Patient identification and proposed procedure were                         verified by the physician, the nurse, the                         anesthesiologist, the anesthetist and the technician                         in the endoscopy suite. Mental Status Examination:                         alert and oriented. Airway Examination: normal                         oropharyngeal airway and neck mobility. Respiratory                         Examination: clear to auscultation. CV Examination:  normal. Prophylactic Antibiotics: The patient does not                         require prophylactic antibiotics. Prior                         Anticoagulants: The patient has taken no  anticoagulant                         or antiplatelet agents. ASA Grade Assessment: III - A                         patient with severe systemic disease. After reviewing                         the risks and benefits, the patient was deemed in                         satisfactory condition to undergo the procedure. The                         anesthesia plan was to use monitored anesthesia care                         (MAC). Immediately prior to administration of                         medications, the patient was re-assessed for adequacy                         to receive sedatives. The heart rate, respiratory                         rate, oxygen saturations, blood pressure, adequacy of                         pulmonary ventilation, and response to care were                         monitored throughout the procedure. The physical                         status of the patient was re-assessed after the                         procedure.                        After obtaining informed consent, the endoscope was                         passed under direct vision. Throughout the procedure,                         the patient's blood pressure, pulse, and oxygen                         saturations were monitored continuously. The Endoscope  was introduced through the mouth, and advanced to the                         second part of duodenum. The upper GI endoscopy was                         accomplished without difficulty. The patient tolerated                         the procedure well. Findings:      A widely patent Schatzki ring was found in the lower third of the       esophagus. This was not dilated as patient denied trouble swallowing       solids or liquids.      A small hiatal hernia was present.      The exam of the esophagus was otherwise normal.      The entire examined stomach was normal.      The examined duodenum was normal. Impression:            -  Widely patent Schatzki ring.                        - Small hiatal hernia.                        - Normal stomach.                        - Normal examined duodenum.                        - No specimens collected. Recommendation:        - Discharge patient to home.                        - Resume previous diet.                        - Continue present medications.                        - Return to referring physician as previously                         scheduled. Procedure Code(s):     --- Professional ---                        [id number], Esophagogastroduodenoscopy, flexible,                         transoral; diagnostic, including collection of                         specimen(s) by brushing or washing, when performed                         (separate procedure) Diagnosis Code(s):     --- Professional ---                        K22.2, Esophageal obstruction  K44.9, Diaphragmatic hernia without obstruction or                         gangrene                        K26.5, Chronic or unspecified duodenal ulcer with                         perforation                        K63.1, Perforation of intestine (nontraumatic) CPT copyright 2022 [employer]. All rights reserved. The codes documented in this report are preliminary and upon coder review may  be revised to meet current compliance requirements. Eather Colas MD, MD 02/15/2023 12:30:01 PM Number of Addenda: 0 Note Initiated On: 02/15/2023 12:19 PM Estimated Blood Loss:  Estimated blood loss: none.      [facility]

## 2023-02-15 NOTE — Anesthesia Preprocedure Evaluation (Signed)
Anesthesia Evaluation  Patient identified by MRN, date of birth, ID band Patient awake    Reviewed: Allergy & Precautions, NPO status , Patient's Chart, lab work & pertinent test results  History of Anesthesia Complications Negative for: history of anesthetic complications  Airway Mallampati: III  TM Distance: >3 FB Neck ROM: full    Dental  (+) Chipped   Pulmonary neg pulmonary ROS, neg shortness of breath   Pulmonary exam normal        Cardiovascular Exercise Tolerance: Good hypertension, (-) angina (-) Past MI Normal cardiovascular exam     Neuro/Psych negative neurological ROS  negative psych ROS   GI/Hepatic Neg liver ROS,GERD  Controlled and Medicated,,  Endo/Other  negative endocrine ROS    Renal/GU Renal disease     Musculoskeletal   Abdominal   Peds  Hematology negative hematology ROS (+)   Anesthesia Other Findings Past Medical History: No date: Arrhythmia No date: Chronic kidney disease     Comment:  UTI No date: GERD (gastroesophageal reflux disease) No date: Glaucoma (increased eye pressure) No date: Hypercholesterolemia No date: Hypertension  Past Surgical History: No date: ABDOMINAL HYSTERECTOMY 06/11/2015: BREAST BIOPSY; Right     Comment:  Procedure: BREAST BIOPSY WITH NEEDLE LOCALIZATION;                Surgeon: Nadeen Landau, MD;  Location: [facility] ORS;                Service: General;  Laterality: Right; 06/11/2015: BREAST EXCISIONAL BIOPSY; Right     Comment:  papilloma No date: BREAST SURGERY; Right     Comment:  Breast Needle Biopsy No date: CHOLECYSTECTOMY No date: DILATION AND CURETTAGE OF UTERUS No date: EYE SURGERY; Bilateral     Comment:  Cataract Extraction with IOL No date: LAPAROSCOPIC OOPHORECTOMY  BMI    Body Mass Index: 19.53 kg/m      Reproductive/Obstetrics negative OB ROS                             Anesthesia Physical Anesthesia  Plan  ASA: 3  Anesthesia Plan: General   Post-op Pain Management: Minimal or no pain anticipated   Induction: Intravenous  PONV Risk Score and Plan: 3 and Propofol infusion, TIVA and Ondansetron  Airway Management Planned: Nasal Cannula  Additional Equipment: None  Intra-op Plan:   Post-operative Plan:   Informed Consent: I have reviewed the patients History and Physical, chart, labs and discussed the procedure including the risks, benefits and alternatives for the proposed anesthesia with the patient or authorized representative who has indicated his/her understanding and acceptance.     Dental advisory given  Plan Discussed with: CRNA and Surgeon  Anesthesia Plan Comments: (Discussed risks of anesthesia with patient, including possibility of difficulty with spontaneous ventilation under anesthesia necessitating airway intervention, PONV, and rare risks such as cardiac or respiratory or neurological events, and allergic reactions. Discussed the role of CRNA in patient's perioperative care. Patient understands.)       Anesthesia Quick Evaluation

## 2023-02-15 NOTE — Transfer of Care (Signed)
Immediate Anesthesia Transfer of Care Note  Patient: Felicia Hughes  Procedure(s) Performed: ESOPHAGOGASTRODUODENOSCOPY (EGD) WITH PROPOFOL  Patient Location: PACU  Anesthesia Type:General  Level of Consciousness: awake and alert   Airway & Oxygen Therapy: Patient Spontanous Breathing  Post-op Assessment: Report given to RN and Post -op Vital signs reviewed and stable  Post vital signs: Reviewed and stable  Last Vitals: See pacu flow sheet.  Pt awake and talking coherantly.   All VSS  Vitals Value Taken Time  BP    Temp    Pulse    Resp 25 02/15/23 1226  SpO2    Vitals shown include unfiled device data.  Last Pain:  Vitals:   02/15/23 1130  TempSrc: Temporal  PainSc: 0-No pain         Complications: No notable events documented.

## 2023-02-15 NOTE — Interval H&P Note (Signed)
History and Physical Interval Note:  02/15/2023 11:55 AM  Felicia Hughes  has presented today for surgery, with the diagnosis of Duodenal Ulcer with perf.  The various methods of treatment have been discussed with the patient and family. After consideration of risks, benefits and other options for treatment, the patient has consented to  Procedure(s): ESOPHAGOGASTRODUODENOSCOPY (EGD) WITH PROPOFOL (N/A) as a surgical intervention.  The patient's history has been reviewed, patient examined, no change in status, stable for surgery.  I have reviewed the patient's chart and labs.  Questions were answered to the patient's satisfaction.     Regis Bill  Ok to proceed with EGD

## 2023-02-16 ENCOUNTER — Encounter: Payer: Self-pay | Admitting: Gastroenterology

## 2023-02-16 NOTE — Anesthesia Postprocedure Evaluation (Signed)
Anesthesia Post Note  Patient: [place] Fritsche  Procedure(s) Performed: ESOPHAGOGASTRODUODENOSCOPY (EGD) WITH PROPOFOL  Patient location during evaluation: Endoscopy Anesthesia Type: General Level of consciousness: awake and alert Pain management: pain level controlled Vital Signs Assessment: post-procedure vital signs reviewed and stable Respiratory status: spontaneous breathing, nonlabored ventilation, respiratory function stable and patient connected to nasal cannula oxygen Cardiovascular status: blood pressure returned to baseline and stable Postop Assessment: no apparent nausea or vomiting Anesthetic complications: no   No notable events documented.   Last Vitals:  Vitals:   02/15/23 1238 02/15/23 1246  BP:  (!) 154/70  Pulse: 88 88  Resp:    Temp:    SpO2: 96% 95%    Last Pain:  Vitals:   02/15/23 1246  TempSrc:   PainSc: 0-No pain                 Stephanie Coup
